# Patient Record
Sex: Male | Born: 1935 | Race: White | Hispanic: No | Smoking: Former smoker
Health system: Southern US, Community
[De-identification: ages and names within clinical notes are randomized; demographics above are authoritative.]

## PROBLEM LIST (undated history)

## (undated) DIAGNOSIS — I10 Essential (primary) hypertension: Secondary | ICD-10-CM

## (undated) DIAGNOSIS — I2510 Atherosclerotic heart disease of native coronary artery without angina pectoris: Secondary | ICD-10-CM

## (undated) DIAGNOSIS — E785 Hyperlipidemia, unspecified: Secondary | ICD-10-CM

## (undated) DIAGNOSIS — I6529 Occlusion and stenosis of unspecified carotid artery: Secondary | ICD-10-CM

## (undated) DIAGNOSIS — I639 Cerebral infarction, unspecified: Secondary | ICD-10-CM

## (undated) HISTORY — DX: Hyperlipidemia, unspecified: E78.5

## (undated) HISTORY — DX: Atherosclerotic heart disease of native coronary artery without angina pectoris: I25.10

## (undated) HISTORY — DX: Occlusion and stenosis of unspecified carotid artery: I65.29

## (undated) HISTORY — DX: Essential (primary) hypertension: I10

## (undated) HISTORY — PX: CAROTID ENDARTERECTOMY: SUR193

## (undated) HISTORY — PX: CORONARY ARTERY BYPASS GRAFT: SHX141

## (undated) HISTORY — DX: Cerebral infarction, unspecified: I63.9

## (undated) HISTORY — PX: CARPAL TUNNEL RELEASE: SHX101

## (undated) HISTORY — PX: APPENDECTOMY: SHX54

---

## 1998-01-12 ENCOUNTER — Emergency Department (HOSPITAL_COMMUNITY): Admission: EM | Admit: 1998-01-12 | Discharge: 1998-01-12 | Payer: Self-pay | Admitting: Emergency Medicine

## 1998-01-12 ENCOUNTER — Encounter: Payer: Self-pay | Admitting: Emergency Medicine

## 2001-02-18 ENCOUNTER — Emergency Department (HOSPITAL_COMMUNITY): Admission: EM | Admit: 2001-02-18 | Discharge: 2001-02-18 | Payer: Self-pay

## 2001-02-18 ENCOUNTER — Ambulatory Visit (HOSPITAL_COMMUNITY): Admission: RE | Admit: 2001-02-18 | Discharge: 2001-02-18 | Payer: Self-pay | Admitting: Family Medicine

## 2001-02-18 ENCOUNTER — Encounter: Payer: Self-pay | Admitting: Family Medicine

## 2001-03-13 ENCOUNTER — Encounter: Payer: Self-pay | Admitting: Vascular Surgery

## 2001-03-17 ENCOUNTER — Inpatient Hospital Stay (HOSPITAL_COMMUNITY): Admission: RE | Admit: 2001-03-17 | Discharge: 2001-03-18 | Payer: Self-pay | Admitting: Vascular Surgery

## 2001-03-17 ENCOUNTER — Encounter (INDEPENDENT_AMBULATORY_CARE_PROVIDER_SITE_OTHER): Payer: Self-pay | Admitting: *Deleted

## 2004-09-27 ENCOUNTER — Encounter: Payer: Self-pay | Admitting: Cardiology

## 2004-09-27 ENCOUNTER — Ambulatory Visit: Payer: Self-pay | Admitting: Cardiology

## 2004-09-27 ENCOUNTER — Ambulatory Visit (HOSPITAL_COMMUNITY): Admission: RE | Admit: 2004-09-27 | Discharge: 2004-09-27 | Payer: Self-pay | Admitting: Family Medicine

## 2006-04-28 ENCOUNTER — Inpatient Hospital Stay (HOSPITAL_COMMUNITY): Admission: EM | Admit: 2006-04-28 | Discharge: 2006-04-30 | Payer: Self-pay | Admitting: Emergency Medicine

## 2006-04-29 ENCOUNTER — Ambulatory Visit: Payer: Self-pay | Admitting: Vascular Surgery

## 2006-04-29 ENCOUNTER — Encounter (INDEPENDENT_AMBULATORY_CARE_PROVIDER_SITE_OTHER): Payer: Self-pay | Admitting: Cardiology

## 2006-04-29 ENCOUNTER — Encounter: Payer: Self-pay | Admitting: Vascular Surgery

## 2008-05-17 ENCOUNTER — Encounter: Admission: RE | Admit: 2008-05-17 | Discharge: 2008-05-17 | Payer: Self-pay | Admitting: Cardiology

## 2008-06-02 DIAGNOSIS — E785 Hyperlipidemia, unspecified: Secondary | ICD-10-CM

## 2008-06-02 HISTORY — DX: Hyperlipidemia, unspecified: E78.5

## 2008-06-10 ENCOUNTER — Encounter: Payer: Self-pay | Admitting: Cardiothoracic Surgery

## 2008-06-10 ENCOUNTER — Ambulatory Visit: Payer: Self-pay | Admitting: Cardiothoracic Surgery

## 2008-06-10 ENCOUNTER — Inpatient Hospital Stay (HOSPITAL_COMMUNITY): Admission: AD | Admit: 2008-06-10 | Discharge: 2008-06-22 | Payer: Self-pay | Admitting: Cardiovascular Disease

## 2008-06-10 HISTORY — PX: CARDIAC CATHETERIZATION: SHX172

## 2008-06-11 ENCOUNTER — Encounter: Payer: Self-pay | Admitting: Cardiothoracic Surgery

## 2008-06-14 ENCOUNTER — Encounter: Payer: Self-pay | Admitting: Cardiothoracic Surgery

## 2008-06-18 ENCOUNTER — Ambulatory Visit: Payer: Self-pay | Admitting: Physical Medicine & Rehabilitation

## 2008-07-02 ENCOUNTER — Encounter: Admission: RE | Admit: 2008-07-02 | Discharge: 2008-07-02 | Payer: Self-pay | Admitting: Cardiothoracic Surgery

## 2008-07-02 ENCOUNTER — Ambulatory Visit: Payer: Self-pay | Admitting: Cardiothoracic Surgery

## 2008-07-16 ENCOUNTER — Ambulatory Visit: Payer: Self-pay | Admitting: Cardiothoracic Surgery

## 2008-07-16 ENCOUNTER — Encounter: Admission: RE | Admit: 2008-07-16 | Discharge: 2008-07-16 | Payer: Self-pay | Admitting: Cardiothoracic Surgery

## 2008-07-22 ENCOUNTER — Encounter (HOSPITAL_COMMUNITY): Admission: RE | Admit: 2008-07-22 | Discharge: 2008-10-20 | Payer: Self-pay | Admitting: Cardiology

## 2008-09-20 ENCOUNTER — Inpatient Hospital Stay (HOSPITAL_COMMUNITY): Admission: EM | Admit: 2008-09-20 | Discharge: 2008-09-22 | Payer: Self-pay | Admitting: Emergency Medicine

## 2008-10-21 ENCOUNTER — Encounter (HOSPITAL_COMMUNITY): Admission: RE | Admit: 2008-10-21 | Discharge: 2009-01-19 | Payer: Self-pay | Admitting: Cardiology

## 2008-12-29 ENCOUNTER — Ambulatory Visit (HOSPITAL_COMMUNITY): Admission: RE | Admit: 2008-12-29 | Discharge: 2008-12-29 | Payer: Self-pay | Admitting: Family Medicine

## 2009-07-01 DIAGNOSIS — A4902 Methicillin resistant Staphylococcus aureus infection, unspecified site: Secondary | ICD-10-CM | POA: Insufficient documentation

## 2009-07-01 DIAGNOSIS — I6529 Occlusion and stenosis of unspecified carotid artery: Secondary | ICD-10-CM | POA: Insufficient documentation

## 2009-07-02 ENCOUNTER — Inpatient Hospital Stay (HOSPITAL_COMMUNITY): Admission: EM | Admit: 2009-07-02 | Discharge: 2009-07-09 | Payer: Self-pay | Admitting: Emergency Medicine

## 2009-07-02 ENCOUNTER — Encounter (INDEPENDENT_AMBULATORY_CARE_PROVIDER_SITE_OTHER): Payer: Self-pay | Admitting: Emergency Medicine

## 2009-07-04 ENCOUNTER — Encounter (INDEPENDENT_AMBULATORY_CARE_PROVIDER_SITE_OTHER): Payer: Self-pay | Admitting: Neurology

## 2009-07-05 ENCOUNTER — Encounter (INDEPENDENT_AMBULATORY_CARE_PROVIDER_SITE_OTHER): Payer: Self-pay | Admitting: Neurology

## 2009-07-05 ENCOUNTER — Encounter (INDEPENDENT_AMBULATORY_CARE_PROVIDER_SITE_OTHER): Payer: Self-pay | Admitting: Emergency Medicine

## 2009-07-08 ENCOUNTER — Ambulatory Visit: Payer: Self-pay | Admitting: Infectious Diseases

## 2009-07-21 ENCOUNTER — Encounter (INDEPENDENT_AMBULATORY_CARE_PROVIDER_SITE_OTHER): Payer: Self-pay | Admitting: *Deleted

## 2009-07-21 DIAGNOSIS — I10 Essential (primary) hypertension: Secondary | ICD-10-CM

## 2009-07-21 DIAGNOSIS — J189 Pneumonia, unspecified organism: Secondary | ICD-10-CM | POA: Insufficient documentation

## 2009-07-21 DIAGNOSIS — Z8679 Personal history of other diseases of the circulatory system: Secondary | ICD-10-CM

## 2009-07-21 DIAGNOSIS — Z9889 Other specified postprocedural states: Secondary | ICD-10-CM

## 2009-07-21 DIAGNOSIS — Z9849 Cataract extraction status, unspecified eye: Secondary | ICD-10-CM

## 2009-07-25 ENCOUNTER — Encounter: Payer: Self-pay | Admitting: Infectious Diseases

## 2009-07-28 ENCOUNTER — Encounter: Payer: Self-pay | Admitting: Infectious Diseases

## 2009-08-04 ENCOUNTER — Ambulatory Visit: Payer: Self-pay | Admitting: Infectious Diseases

## 2009-08-25 ENCOUNTER — Inpatient Hospital Stay (HOSPITAL_COMMUNITY): Admission: EM | Admit: 2009-08-25 | Discharge: 2009-08-29 | Payer: Self-pay | Admitting: Emergency Medicine

## 2009-08-26 IMAGING — CR DG CHEST 2V
2 series · 2 of 2 positions shown · non-contrast
Comparison: 07/02/2008

CLINICAL DATA: Status post CABG 5 weeks ago

CHEST - 2 VIEW

[w chest pa]
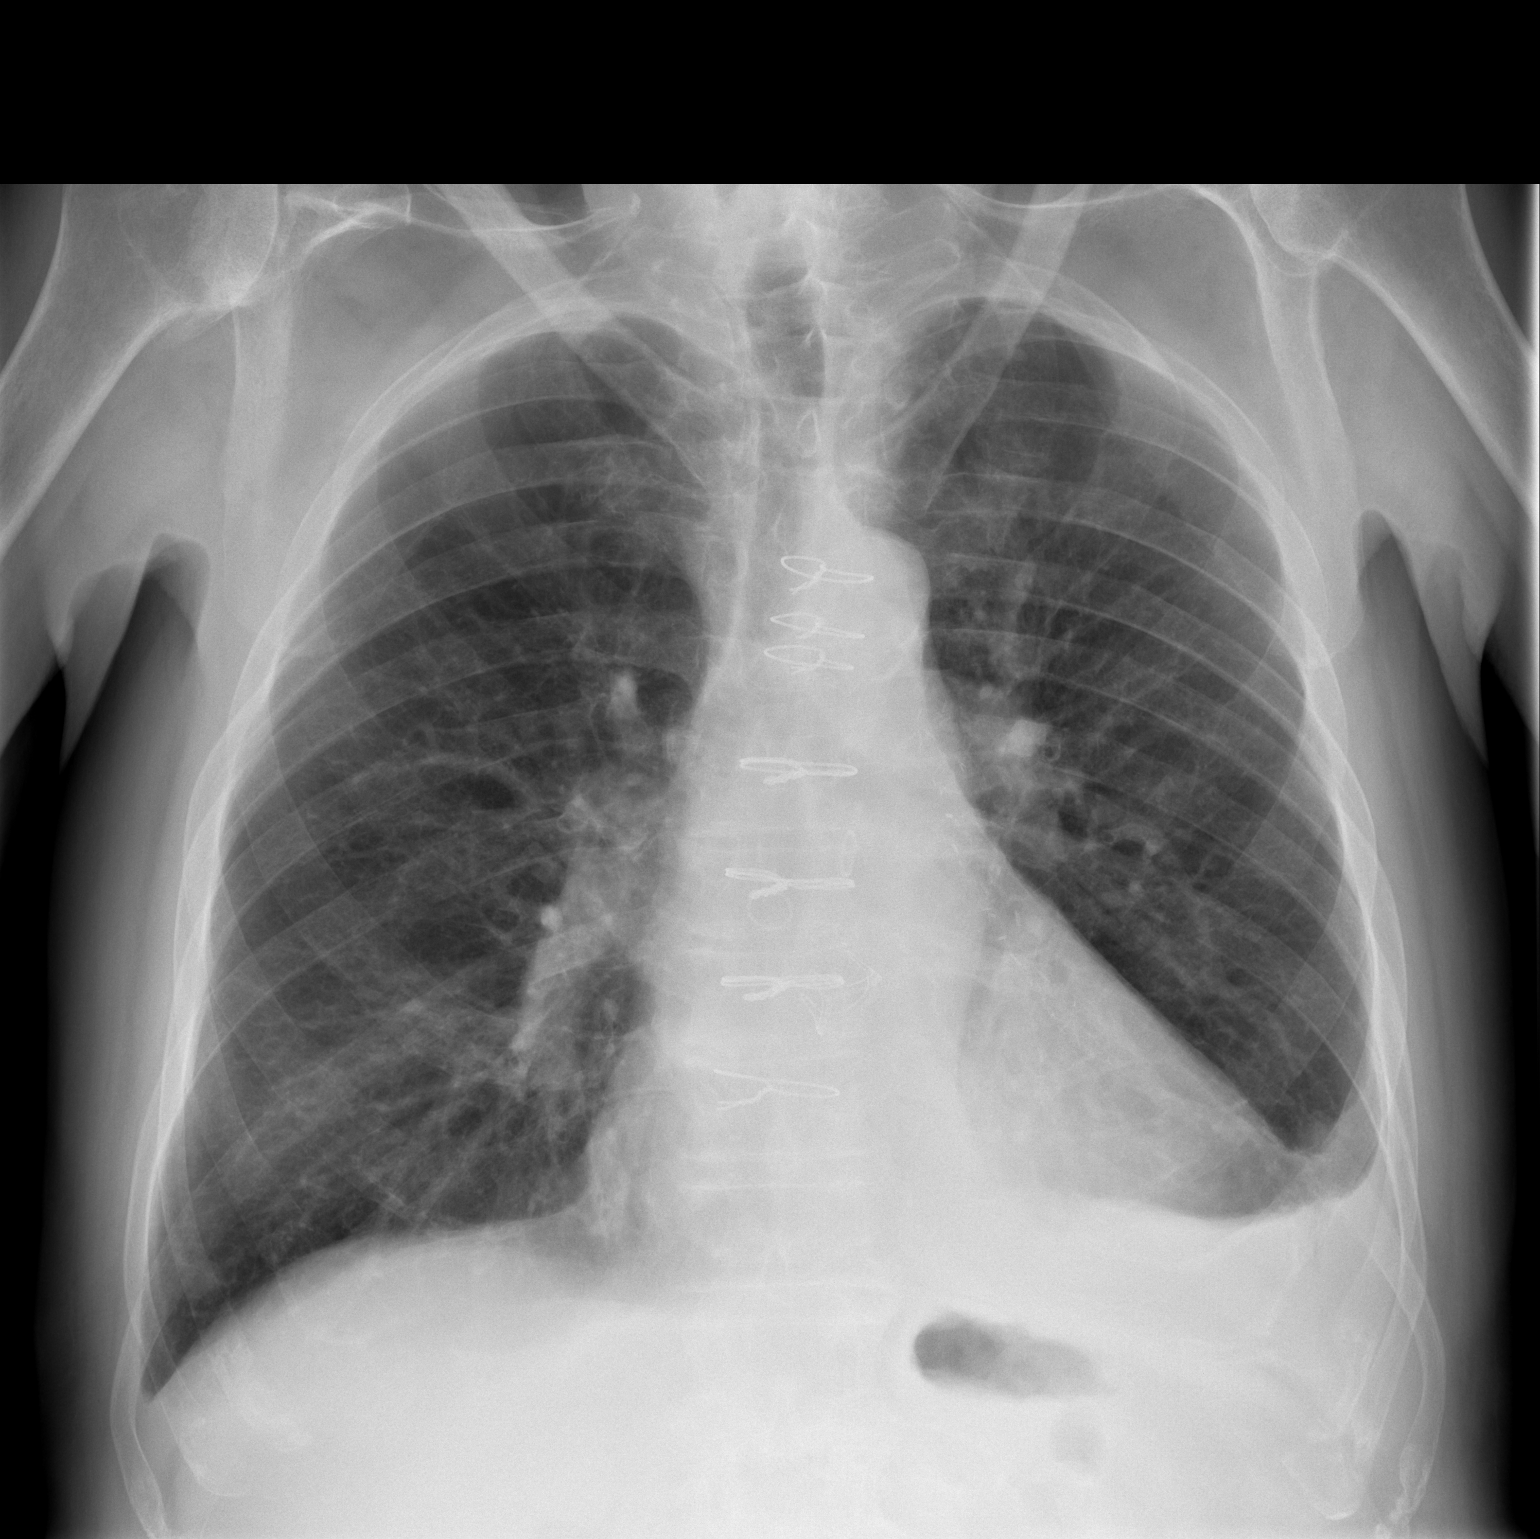

[w chest lat]
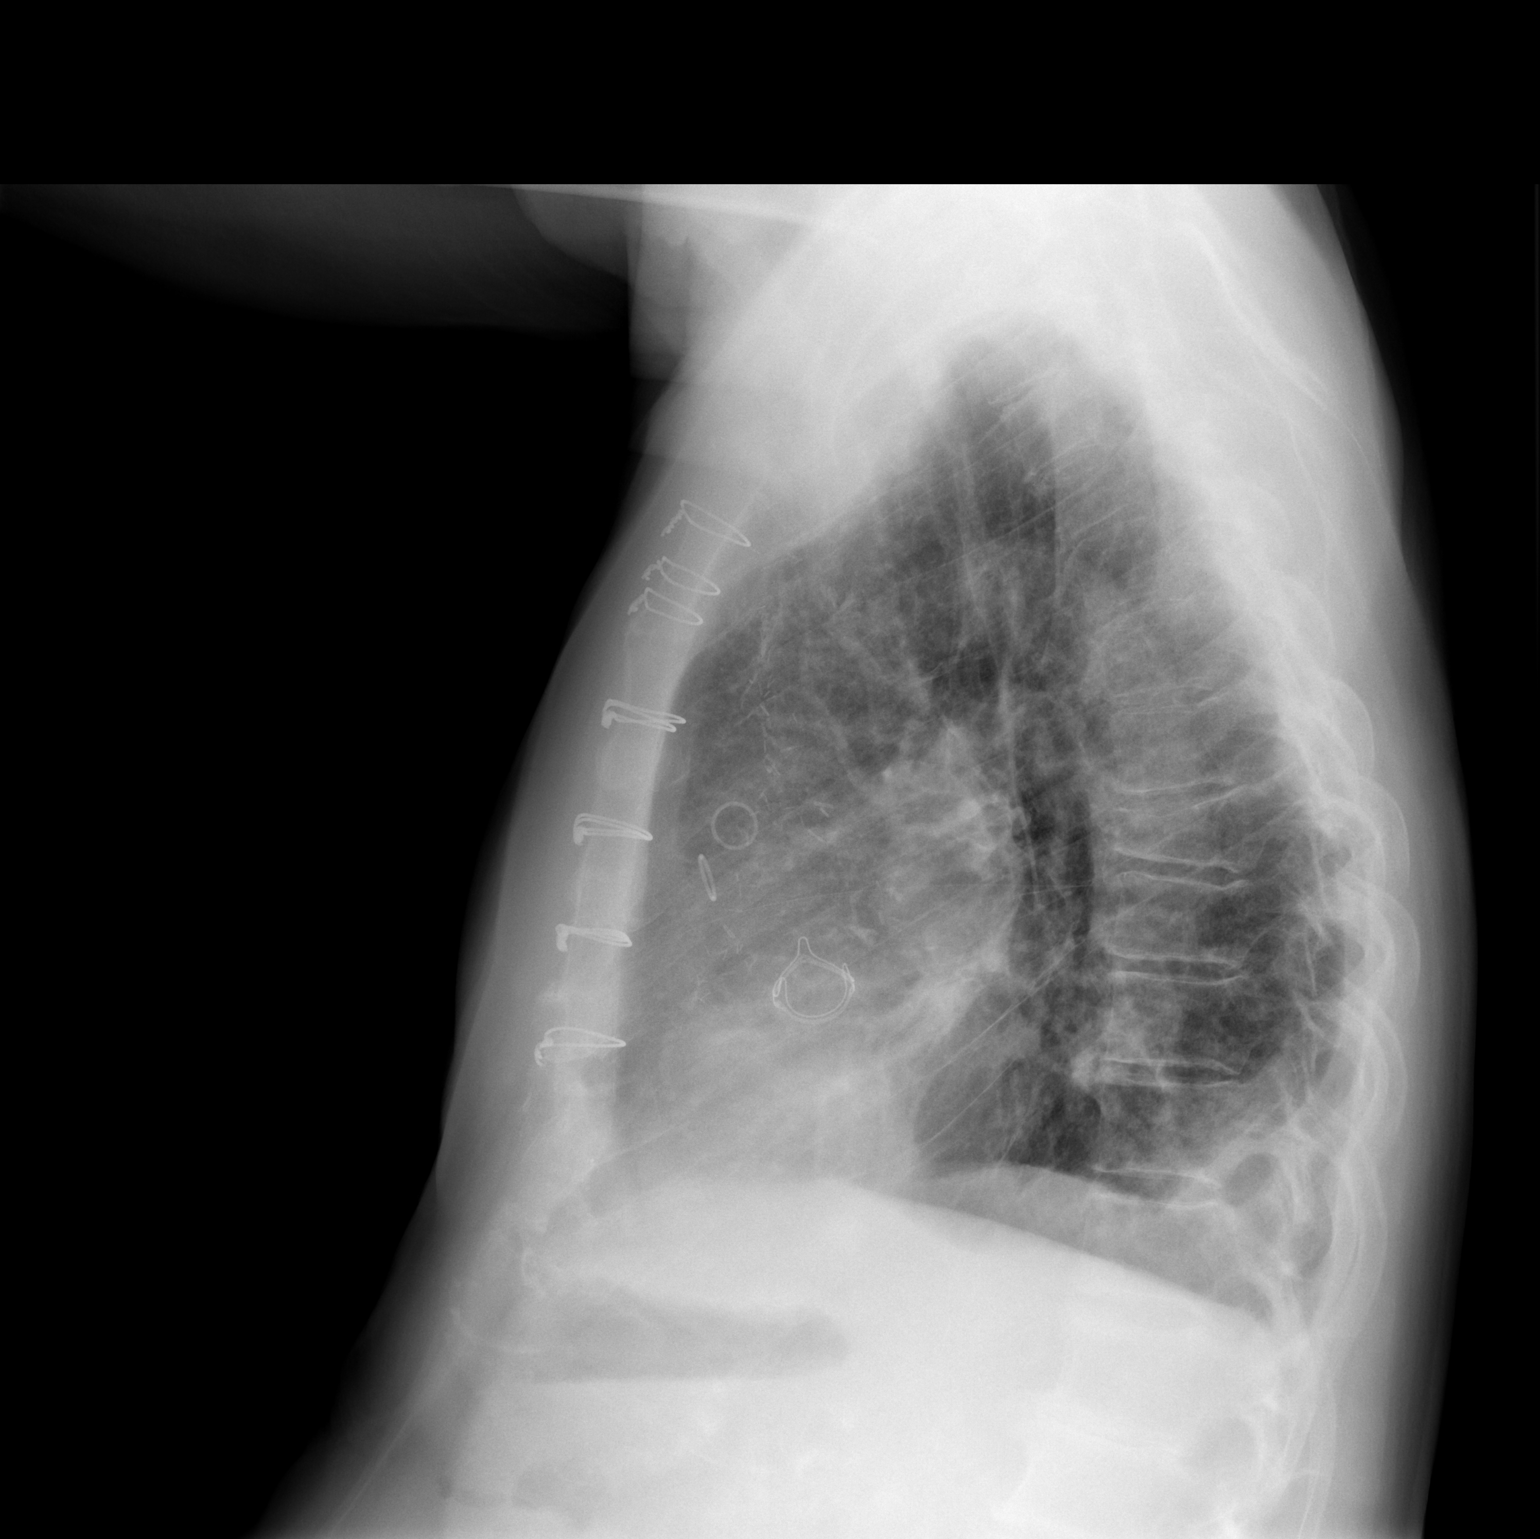

[2 of 2 positions shown; findings below may reference images not displayed]

FINDINGS: Status post CABG again noted. Status post cardiac valve
replacement.  Cardiomediastinal silhouette is stable.  Stable small
left pleural effusion and left basilar atelectasis.  Right lung is
clear. No pulmonary edema.
IMPRESSION: Status post CABG and cardiac valve replacement.  No pulmonary
edema.  Small left pleural effusion and left basilar atelectasis.

## 2009-08-28 ENCOUNTER — Encounter (INDEPENDENT_AMBULATORY_CARE_PROVIDER_SITE_OTHER): Payer: Self-pay | Admitting: Internal Medicine

## 2009-08-28 DIAGNOSIS — I10 Essential (primary) hypertension: Secondary | ICD-10-CM

## 2009-08-28 HISTORY — DX: Essential (primary) hypertension: I10

## 2009-08-29 ENCOUNTER — Encounter (INDEPENDENT_AMBULATORY_CARE_PROVIDER_SITE_OTHER): Payer: Self-pay | Admitting: Cardiology

## 2009-09-23 ENCOUNTER — Ambulatory Visit (HOSPITAL_COMMUNITY): Admission: RE | Admit: 2009-09-23 | Discharge: 2009-09-23 | Payer: Self-pay | Admitting: Family Medicine

## 2009-10-13 ENCOUNTER — Encounter: Admission: RE | Admit: 2009-10-13 | Discharge: 2009-11-14 | Payer: Self-pay | Admitting: Neurology

## 2010-03-12 ENCOUNTER — Encounter: Payer: Self-pay | Admitting: Family Medicine

## 2010-03-21 NOTE — Miscellaneous (Signed)
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 (ICD-433.10) Added new problem of HEART VALVE REPLACEMENT, HX OF (ICD-V15.1) - 2010 Added new problem of MRSA (ICD-041.12) - blood culture + Added new problem of TRANSIENT ISCHEMIC ATTACKS, HX OF (ICD-V12.50) - 2003 Added new problem of CATARACT EXTRACTION, HX OF (ICD-V45.61) - 2000 Added new problem of PNEUMONIA (ZOX-096) - August 2010 Added new problem of HYPERTENSION (ICD-401.9) Medications: Added new medication of ASPIRIN 325 MG TABS (ASPIRIN) Take 1 tablet by mouth once a day Added new medication of FLOMAX 0.4 MG CAPS (TAMSULOSIN HCL) Take 1 tablet by mouth once a day Added new medication of COREG 12.5 MG TABS (CARVEDILOL) Take 1 tablet by mouth two times a day Added new medication of FUROSEMIDE 40 MG TABS (FUROSEMIDE) Take 1 tablet by mouth once a day Added new medication of GLIPIZIDE 5 MG TABS (GLIPIZIDE) Take 1 tablet by mouth once a day Added new medication of KLOR-CON M20 20 MEQ CR-TABS (POTASSIUM CHLORIDE CRYS CR) Take 1 tablet by mouth once a day Added new medication of LIPITOR 80 MG TABS (ATORVASTATIN CALCIUM) Take 1/2 tablet by mouth at bedtime Added new medication of NU-IRON 150 MG CAPS (POLYSACCHARIDE IRON COMPLEX) Take 1 tablet by mouth once a day Added new medication of VITAMIN B-6 100 MG TABS (PYRIDOXINE HCL) Take 1 tablet by mouth once a day Added new medication of VITAMIN D3 1000 UNIT TABS (CHOLECALCIFEROL) Take 1 tablet by mouth once a day Added new medication of VANCOMYCIN HCL 750 MG SOLR (VANCOMYCIN HCL) Administer 750 mg IV via PICC every 12 hours, pharmacy to monitor and adjust dose per protocol Observations: Added new observation of NKA: T (07/21/2009 16:31)

## 2010-03-21 NOTE — Assessment & Plan Note (Signed)
Summary: hsfu / cellulitis   Vital Signs:  Patient profile:   77 year old male Height:      68 inches (172.72 cm) Weight:      152.5 pounds (69.32 kg) BMI:     23.27 Temp:     98.1 degrees F (36.72 degrees C) oral BP sitting:   155 / 84  (left arm) Cuff size:   regular  Vitals Entered By: Jennet Maduro RN (August 04, 2009 3:44 PM) CC: HSFU, d/c 07/09/2009, Stroke, MRSA septicemia, PICC site unremarkable Is Patient Diabetic? Yes Did you bring your meter with you today? 138 Pain Assessment Patient in pain? no      Nutritional Status BMI of 19 -24 = normal Nutritional Status Detail appetite "good"  Have you ever been in a relationship where you felt threatened, hurt or afraid?not asked today   Does patient need assistance? Functional Status Cook/clean, Shopping, Social activities Ambulation Normal   CC:  HSFU, d/c 07/09/2009, Stroke, MRSA septicemia, and PICC site unremarkable.  Preventive Screening-Counseling & Management  Alcohol-Tobacco     Alcohol drinks/day: 0     Smoking Status: quit     Year Quit: 1946  Caffeine-Diet-Exercise     Caffeine use/day: yes     Does Patient Exercise: yes     Type of exercise: around the yeard to the mailbox  Safety-Violence-Falls     Seat Belt Use: yes  Allergies: No Known Drug Allergies   Other Orders: Est. Patient Level IV (16109)

## 2010-03-21 NOTE — Miscellaneous (Signed)
Summary: [facility]: CBCw/Diff  [facility]: CBCw/Diff   Imported By: Florinda Marker 08/04/2009 16:05:41  _____________________________________________________________________  External Attachment:    Type:   Image     Comment:   External Document

## 2010-03-21 NOTE — Miscellaneous (Signed)
Summary: HIPAA Restrictions  HIPAA Restrictions   Imported By: Florinda Marker 08/08/2009 14:45:24  _____________________________________________________________________  External Attachment:    Type:   Image     Comment:   External Document

## 2010-03-21 NOTE — Miscellaneous (Signed)
Summary: [facility]: Orders  [facility]: Orders   Imported By: Florinda Marker 08/03/2009 11:20:04  _____________________________________________________________________  External Attachment:    Type:   Image     Comment:   External Document

## 2010-05-07 LAB — HEMOGLOBIN A1C
Hgb A1c MFr Bld: 6.2 % — ABNORMAL HIGH (ref <5.7)
Mean Plasma Glucose: 131 mg/dL — ABNORMAL HIGH (ref <117)

## 2010-05-07 LAB — URINE CULTURE: Colony Count: NO GROWTH

## 2010-05-07 LAB — GLUCOSE, CAPILLARY
Glucose-Capillary: 102 mg/dL — ABNORMAL HIGH (ref 70–99)
Glucose-Capillary: 117 mg/dL — ABNORMAL HIGH (ref 70–99)
Glucose-Capillary: 121 mg/dL — ABNORMAL HIGH (ref 70–99)
Glucose-Capillary: 122 mg/dL — ABNORMAL HIGH (ref 70–99)
Glucose-Capillary: 145 mg/dL — ABNORMAL HIGH (ref 70–99)
Glucose-Capillary: 147 mg/dL — ABNORMAL HIGH (ref 70–99)
Glucose-Capillary: 180 mg/dL — ABNORMAL HIGH (ref 70–99)
Glucose-Capillary: 99 mg/dL (ref 70–99)

## 2010-05-07 LAB — BASIC METABOLIC PANEL
CO2: 27 mEq/L (ref 19–32)
CO2: 28 mEq/L (ref 19–32)
CO2: 29 mEq/L (ref 19–32)
CO2: 30 mEq/L (ref 19–32)
Calcium: 8.7 mg/dL (ref 8.4–10.5)
Calcium: 9.2 mg/dL (ref 8.4–10.5)
Chloride: 103 mEq/L (ref 96–112)
Chloride: 105 mEq/L (ref 96–112)
Chloride: 107 mEq/L (ref 96–112)
Creatinine, Ser: 1.33 mg/dL (ref 0.4–1.5)
Creatinine, Ser: 1.48 mg/dL (ref 0.4–1.5)
Creatinine, Ser: 1.5 mg/dL (ref 0.4–1.5)
GFR calc Af Amer: 54 mL/min — ABNORMAL LOW (ref >60)
GFR calc Af Amer: 55 mL/min — ABNORMAL LOW (ref >60)
GFR calc Af Amer: >60 mL/min (ref >60)
GFR calc non Af Amer: 51 mL/min — ABNORMAL LOW (ref >60)
Glucose, Bld: 125 mg/dL — ABNORMAL HIGH (ref 70–99)
Glucose, Bld: 156 mg/dL — ABNORMAL HIGH (ref 70–99)
Glucose, Bld: 167 mg/dL — ABNORMAL HIGH (ref 70–99)
Potassium: 3.8 mEq/L (ref 3.5–5.1)
Sodium: 139 mEq/L (ref 135–145)
Sodium: 141 mEq/L (ref 135–145)
Sodium: 143 mEq/L (ref 135–145)

## 2010-05-07 LAB — LIPID PANEL
HDL: 40 mg/dL (ref >39)
LDL Cholesterol: 44 mg/dL (ref 0–99)
Total CHOL/HDL Ratio: 2.7 RATIO
Triglycerides: 111 mg/dL (ref <150)
VLDL: 22 mg/dL (ref 0–40)

## 2010-05-07 LAB — HEPATIC FUNCTION PANEL
Alkaline Phosphatase: 39 U/L (ref 39–117)
Indirect Bilirubin: 0.9 mg/dL (ref 0.3–0.9)
Total Bilirubin: 1.1 mg/dL (ref 0.3–1.2)

## 2010-05-07 LAB — CULTURE, BLOOD (ROUTINE X 2)
Culture: NO GROWTH
Culture: NO GROWTH
Culture: NO GROWTH

## 2010-05-07 LAB — CBC
Hemoglobin: 13 g/dL (ref 13.0–17.0)
MCH: 32.6 pg (ref 26.0–34.0)
MCV: 95.4 fL (ref 78.0–100.0)
Platelets: 112 10*3/uL — ABNORMAL LOW (ref 150–400)
RBC: 4 MIL/uL — ABNORMAL LOW (ref 4.22–5.81)

## 2010-05-07 LAB — DIFFERENTIAL
Eosinophils Absolute: 0.1 10*3/uL (ref 0.0–0.7)
Eosinophils Relative: 1 % (ref 0–5)
Lymphs Abs: 0.6 10*3/uL — ABNORMAL LOW (ref 0.7–4.0)
Monocytes Relative: 5 % (ref 3–12)

## 2010-05-07 LAB — POCT CARDIAC MARKERS

## 2010-05-07 LAB — URINALYSIS, ROUTINE W REFLEX MICROSCOPIC
Ketones, ur: NEGATIVE mg/dL
Nitrite: NEGATIVE
Specific Gravity, Urine: 1.02 (ref 1.005–1.030)
pH: 5.5 (ref 5.0–8.0)

## 2010-05-07 LAB — MRSA PCR SCREENING: MRSA by PCR: POSITIVE — AB

## 2010-05-07 LAB — TROPONIN I: Troponin I: 0.01 ng/mL (ref 0.00–0.06)

## 2010-05-08 LAB — BLOOD GAS, ARTERIAL
TCO2: 28.5 mmol/L (ref 0–100)
pCO2 arterial: 48.1 mmHg — ABNORMAL HIGH (ref 35.0–45.0)
pH, Arterial: 7.385 (ref 7.350–7.450)
pO2, Arterial: 88.3 mmHg (ref 80.0–100.0)

## 2010-05-08 LAB — CBC
HCT: 39.1 % (ref 39.0–52.0)
Hemoglobin: 12.9 g/dL — ABNORMAL LOW (ref 13.0–17.0)
Hemoglobin: 13.4 g/dL (ref 13.0–17.0)
MCHC: 34.4 g/dL (ref 30.0–36.0)
MCHC: 34.4 g/dL (ref 30.0–36.0)
MCV: 95.9 fL (ref 78.0–100.0)
RBC: 3.92 MIL/uL — ABNORMAL LOW (ref 4.22–5.81)
RDW: 12.9 % (ref 11.5–15.5)

## 2010-05-08 LAB — BASIC METABOLIC PANEL
CO2: 31 mEq/L (ref 19–32)
Calcium: 8.9 mg/dL (ref 8.4–10.5)
Chloride: 106 mEq/L (ref 96–112)
GFR calc Af Amer: >60 mL/min (ref >60)
Potassium: 4 mEq/L (ref 3.5–5.1)
Sodium: 141 mEq/L (ref 135–145)

## 2010-05-08 LAB — URINALYSIS, ROUTINE W REFLEX MICROSCOPIC
Glucose, UA: NEGATIVE mg/dL
Hgb urine dipstick: NEGATIVE
Protein, ur: NEGATIVE mg/dL

## 2010-05-08 LAB — URINE CULTURE

## 2010-05-08 LAB — DIFFERENTIAL
Basophils Absolute: 0 10*3/uL (ref 0.0–0.1)
Basophils Relative: 0 % (ref 0–1)
Eosinophils Absolute: 0.2 10*3/uL (ref 0.0–0.7)
Eosinophils Relative: 2 % (ref 0–5)
Monocytes Absolute: 0.8 10*3/uL (ref 0.1–1.0)

## 2010-05-08 LAB — CULTURE, BLOOD (ROUTINE X 2)

## 2010-05-08 LAB — GLUCOSE, CAPILLARY

## 2010-05-09 LAB — CBC
HCT: 36.6 % — ABNORMAL LOW (ref 39.0–52.0)
Hemoglobin: 12.7 g/dL — ABNORMAL LOW (ref 13.0–17.0)
Hemoglobin: 13.3 g/dL (ref 13.0–17.0)
Platelets: 174 10*3/uL (ref 150–400)
RBC: 3.84 MIL/uL — ABNORMAL LOW (ref 4.22–5.81)
RDW: 13.3 % (ref 11.5–15.5)
RDW: 13.3 % (ref 11.5–15.5)

## 2010-05-09 LAB — COMPREHENSIVE METABOLIC PANEL
ALT: 16 U/L (ref 0–53)
Albumin: 3.5 g/dL (ref 3.5–5.2)
Alkaline Phosphatase: 50 U/L (ref 39–117)
BUN: 20 mg/dL (ref 6–23)
BUN: 23 mg/dL (ref 6–23)
CO2: 30 mEq/L (ref 19–32)
Calcium: 9.3 mg/dL (ref 8.4–10.5)
Creatinine, Ser: 1.56 mg/dL — ABNORMAL HIGH (ref 0.4–1.5)
GFR calc non Af Amer: 52 mL/min — ABNORMAL LOW (ref >60)
Glucose, Bld: 130 mg/dL — ABNORMAL HIGH (ref 70–99)
Potassium: 3.8 mEq/L (ref 3.5–5.1)
Potassium: 4.3 mEq/L (ref 3.5–5.1)
Total Bilirubin: 0.7 mg/dL (ref 0.3–1.2)
Total Protein: 6.1 g/dL (ref 6.0–8.3)
Total Protein: 6.7 g/dL (ref 6.0–8.3)

## 2010-05-09 LAB — URINALYSIS, DIPSTICK ONLY
Bilirubin Urine: NEGATIVE
Glucose, UA: NEGATIVE mg/dL
Ketones, ur: NEGATIVE mg/dL
Leukocytes, UA: NEGATIVE
Nitrite: NEGATIVE
Protein, ur: 30 mg/dL — AB

## 2010-05-09 LAB — URINALYSIS, ROUTINE W REFLEX MICROSCOPIC
Hgb urine dipstick: NEGATIVE
Ketones, ur: NEGATIVE mg/dL
Protein, ur: NEGATIVE mg/dL
Urobilinogen, UA: 1 mg/dL (ref 0.0–1.0)

## 2010-05-09 LAB — GLUCOSE, CAPILLARY: Glucose-Capillary: 118 mg/dL — ABNORMAL HIGH (ref 70–99)

## 2010-05-09 LAB — LIPID PANEL
HDL: 39 mg/dL — ABNORMAL LOW (ref >39)
LDL Cholesterol: 62 mg/dL (ref 0–99)
Total CHOL/HDL Ratio: 2.8 RATIO
Triglycerides: 36 mg/dL (ref <150)
VLDL: 7 mg/dL (ref 0–40)

## 2010-05-09 LAB — DIFFERENTIAL
Lymphocytes Relative: 18 % (ref 12–46)
Monocytes Absolute: 0.8 10*3/uL (ref 0.1–1.0)
Monocytes Relative: 11 % (ref 3–12)
Neutro Abs: 4.7 10*3/uL (ref 1.7–7.7)

## 2010-05-09 LAB — CARDIAC PANEL(CRET KIN+CKTOT+MB+TROPI): CK, MB: 2.6 ng/mL (ref 0.3–4.0)

## 2010-05-09 LAB — TROPONIN I: Troponin I: 0.02 ng/mL (ref 0.00–0.06)

## 2010-05-09 LAB — CK TOTAL AND CKMB (NOT AT ARMC)
CK, MB: 1.5 ng/mL (ref 0.3–4.0)
Relative Index: INVALID (ref 0.0–2.5)
Total CK: 46 U/L (ref 7–232)

## 2010-05-09 LAB — HEMOGLOBIN A1C
Hgb A1c MFr Bld: 6.2 % — ABNORMAL HIGH (ref <5.7)
Mean Plasma Glucose: 131 mg/dL — ABNORMAL HIGH (ref <117)

## 2010-05-09 LAB — APTT: aPTT: 24 seconds (ref 24–37)

## 2010-05-17 ENCOUNTER — Encounter: Payer: Self-pay | Admitting: Family Medicine

## 2010-05-17 DIAGNOSIS — E119 Type 2 diabetes mellitus without complications: Secondary | ICD-10-CM

## 2010-05-25 LAB — GLUCOSE, CAPILLARY
Glucose-Capillary: 114 mg/dL — ABNORMAL HIGH (ref 70–99)
Glucose-Capillary: 112 mg/dL — ABNORMAL HIGH (ref 70–99)

## 2010-05-26 LAB — GLUCOSE, CAPILLARY: Glucose-Capillary: 124 mg/dL — ABNORMAL HIGH (ref 70–99)

## 2010-05-27 LAB — GLUCOSE, CAPILLARY
Glucose-Capillary: 104 mg/dL — ABNORMAL HIGH (ref 70–99)
Glucose-Capillary: 118 mg/dL — ABNORMAL HIGH (ref 70–99)
Glucose-Capillary: 131 mg/dL — ABNORMAL HIGH (ref 70–99)
Glucose-Capillary: 136 mg/dL — ABNORMAL HIGH (ref 70–99)
Glucose-Capillary: 177 mg/dL — ABNORMAL HIGH (ref 70–99)
Glucose-Capillary: 193 mg/dL — ABNORMAL HIGH (ref 70–99)
Glucose-Capillary: 198 mg/dL — ABNORMAL HIGH (ref 70–99)

## 2010-05-27 LAB — BASIC METABOLIC PANEL
Calcium: 8.8 mg/dL (ref 8.4–10.5)
Creatinine, Ser: 1.34 mg/dL (ref 0.4–1.5)
GFR calc Af Amer: >60 mL/min (ref >60)
GFR calc non Af Amer: 51 mL/min — ABNORMAL LOW (ref >60)
Sodium: 140 mEq/L (ref 135–145)

## 2010-05-27 LAB — VANCOMYCIN, TROUGH: Vancomycin Tr: 18.4 ug/mL (ref 10.0–20.0)

## 2010-05-27 LAB — CBC
Hemoglobin: 11.8 g/dL — ABNORMAL LOW (ref 13.0–17.0)
MCHC: 34.2 g/dL (ref 30.0–36.0)
MCHC: 34.3 g/dL (ref 30.0–36.0)
Platelets: 181 10*3/uL (ref 150–400)
RBC: 3.75 MIL/uL — ABNORMAL LOW (ref 4.22–5.81)
RDW: 13.9 % (ref 11.5–15.5)
RDW: 14 % (ref 11.5–15.5)

## 2010-05-28 LAB — COMPREHENSIVE METABOLIC PANEL
ALT: 16 U/L (ref 0–53)
AST: 36 U/L (ref 0–37)
Albumin: 2.9 g/dL — ABNORMAL LOW (ref 3.5–5.2)
Alkaline Phosphatase: 42 U/L (ref 39–117)
Alkaline Phosphatase: 54 U/L (ref 39–117)
BUN: 18 mg/dL (ref 6–23)
CO2: 31 mEq/L (ref 19–32)
Calcium: 9.4 mg/dL (ref 8.4–10.5)
Chloride: 104 mEq/L (ref 96–112)
Creatinine, Ser: 1.22 mg/dL (ref 0.4–1.5)
GFR calc Af Amer: >60 mL/min (ref >60)
GFR calc non Af Amer: 59 mL/min — ABNORMAL LOW (ref >60)
Glucose, Bld: 123 mg/dL — ABNORMAL HIGH (ref 70–99)
Glucose, Bld: 138 mg/dL — ABNORMAL HIGH (ref 70–99)
Potassium: 3.6 mEq/L (ref 3.5–5.1)
Sodium: 144 mEq/L (ref 135–145)
Total Bilirubin: 0.5 mg/dL (ref 0.3–1.2)
Total Protein: 6.3 g/dL (ref 6.0–8.3)

## 2010-05-28 LAB — DIFFERENTIAL
Basophils Absolute: 0 10*3/uL (ref 0.0–0.1)
Basophils Relative: 0 % (ref 0–1)
Eosinophils Absolute: 0.1 10*3/uL (ref 0.0–0.7)
Eosinophils Relative: 2 % (ref 0–5)
Neutrophils Relative %: 74 % (ref 43–77)

## 2010-05-28 LAB — GLUCOSE, CAPILLARY
Glucose-Capillary: 119 mg/dL — ABNORMAL HIGH (ref 70–99)
Glucose-Capillary: 146 mg/dL — ABNORMAL HIGH (ref 70–99)
Glucose-Capillary: 160 mg/dL — ABNORMAL HIGH (ref 70–99)

## 2010-05-28 LAB — CBC
Hemoglobin: 14 g/dL (ref 13.0–17.0)
MCHC: 33.7 g/dL (ref 30.0–36.0)
RBC: 4.46 MIL/uL (ref 4.22–5.81)
WBC: 5.1 10*3/uL (ref 4.0–10.5)

## 2010-05-28 LAB — URINALYSIS, ROUTINE W REFLEX MICROSCOPIC
Bilirubin Urine: NEGATIVE
Glucose, UA: NEGATIVE mg/dL
Ketones, ur: NEGATIVE mg/dL
Nitrite: NEGATIVE
Specific Gravity, Urine: 1.009 (ref 1.005–1.030)
pH: 5.5 (ref 5.0–8.0)

## 2010-05-28 LAB — CULTURE, BLOOD (ROUTINE X 2)
Culture: NO GROWTH
Culture: NO GROWTH

## 2010-05-28 LAB — D-DIMER, QUANTITATIVE: D-Dimer, Quant: 1.32 ug/mL-FEU — ABNORMAL HIGH (ref 0.00–0.48)

## 2010-05-28 LAB — HEMOGLOBIN A1C
Hgb A1c MFr Bld: 5.9 % (ref 4.6–6.1)
Mean Plasma Glucose: 123 mg/dL

## 2010-05-29 LAB — GLUCOSE, CAPILLARY
Glucose-Capillary: 128 mg/dL — ABNORMAL HIGH (ref 70–99)
Glucose-Capillary: 101 mg/dL — ABNORMAL HIGH (ref 70–99)
Glucose-Capillary: 195 mg/dL — ABNORMAL HIGH (ref 70–99)

## 2010-05-30 LAB — CBC
HCT: 29.2 % — ABNORMAL LOW (ref 39.0–52.0)
MCHC: 34.6 g/dL (ref 30.0–36.0)
MCV: 93.6 fL (ref 78.0–100.0)
Platelets: 203 10*3/uL (ref 150–400)
RDW: 13.8 % (ref 11.5–15.5)

## 2010-05-30 LAB — BASIC METABOLIC PANEL
BUN: 26 mg/dL — ABNORMAL HIGH (ref 6–23)
BUN: 33 mg/dL — ABNORMAL HIGH (ref 6–23)
BUN: 40 mg/dL — ABNORMAL HIGH (ref 6–23)
CO2: 28 mEq/L (ref 19–32)
CO2: 28 mEq/L (ref 19–32)
Calcium: 8.8 mg/dL (ref 8.4–10.5)
Chloride: 107 mEq/L (ref 96–112)
Chloride: 109 mEq/L (ref 96–112)
Creatinine, Ser: 1.34 mg/dL (ref 0.4–1.5)
Creatinine, Ser: 1.5 mg/dL (ref 0.4–1.5)
GFR calc Af Amer: >60 mL/min (ref >60)
GFR calc non Af Amer: 48 mL/min — ABNORMAL LOW (ref >60)
Glucose, Bld: 124 mg/dL — ABNORMAL HIGH (ref 70–99)
Potassium: 4 mEq/L (ref 3.5–5.1)
Potassium: 4.5 mEq/L (ref 3.5–5.1)
Sodium: 141 mEq/L (ref 135–145)

## 2010-05-30 LAB — GLUCOSE, CAPILLARY
Glucose-Capillary: 102 mg/dL — ABNORMAL HIGH (ref 70–99)
Glucose-Capillary: 107 mg/dL — ABNORMAL HIGH (ref 70–99)
Glucose-Capillary: 123 mg/dL — ABNORMAL HIGH (ref 70–99)
Glucose-Capillary: 160 mg/dL — ABNORMAL HIGH (ref 70–99)
Glucose-Capillary: 164 mg/dL — ABNORMAL HIGH (ref 70–99)
Glucose-Capillary: 173 mg/dL — ABNORMAL HIGH (ref 70–99)
Glucose-Capillary: 87 mg/dL (ref 70–99)

## 2010-05-31 LAB — CBC
HCT: 23.8 % — ABNORMAL LOW (ref 39.0–52.0)
HCT: 25.6 % — ABNORMAL LOW (ref 39.0–52.0)
HCT: 25.8 % — ABNORMAL LOW (ref 39.0–52.0)
HCT: 27 % — ABNORMAL LOW (ref 39.0–52.0)
HCT: 28 % — ABNORMAL LOW (ref 39.0–52.0)
HCT: 29.9 % — ABNORMAL LOW (ref 39.0–52.0)
HCT: 30.7 % — ABNORMAL LOW (ref 39.0–52.0)
HCT: 37.6 % — ABNORMAL LOW (ref 39.0–52.0)
HCT: 38 % — ABNORMAL LOW (ref 39.0–52.0)
HCT: 40 % (ref 39.0–52.0)
Hemoglobin: 10.3 g/dL — ABNORMAL LOW (ref 13.0–17.0)
Hemoglobin: 10.4 g/dL — ABNORMAL LOW (ref 13.0–17.0)
Hemoglobin: 12.9 g/dL — ABNORMAL LOW (ref 13.0–17.0)
Hemoglobin: 13 g/dL (ref 13.0–17.0)
Hemoglobin: 13.5 g/dL (ref 13.0–17.0)
Hemoglobin: 8.3 g/dL — ABNORMAL LOW (ref 13.0–17.0)
Hemoglobin: 8.9 g/dL — ABNORMAL LOW (ref 13.0–17.0)
Hemoglobin: 9.1 g/dL — ABNORMAL LOW (ref 13.0–17.0)
Hemoglobin: 9.2 g/dL — ABNORMAL LOW (ref 13.0–17.0)
Hemoglobin: 9.8 g/dL — ABNORMAL LOW (ref 13.0–17.0)
MCHC: 34 g/dL (ref 30.0–36.0)
MCHC: 34.2 g/dL (ref 30.0–36.0)
MCHC: 34.2 g/dL (ref 30.0–36.0)
MCHC: 34.2 g/dL (ref 30.0–36.0)
MCHC: 34.3 g/dL (ref 30.0–36.0)
MCHC: 34.3 g/dL (ref 30.0–36.0)
MCHC: 34.4 g/dL (ref 30.0–36.0)
MCHC: 34.5 g/dL (ref 30.0–36.0)
MCHC: 34.6 g/dL (ref 30.0–36.0)
MCHC: 34.7 g/dL (ref 30.0–36.0)
MCHC: 34.8 g/dL (ref 30.0–36.0)
MCHC: 35.4 g/dL (ref 30.0–36.0)
MCV: 92.9 fL (ref 78.0–100.0)
MCV: 94.1 fL (ref 78.0–100.0)
MCV: 94.3 fL (ref 78.0–100.0)
MCV: 95.1 fL (ref 78.0–100.0)
MCV: 95.3 fL (ref 78.0–100.0)
MCV: 95.4 fL (ref 78.0–100.0)
MCV: 95.6 fL (ref 78.0–100.0)
MCV: 95.6 fL (ref 78.0–100.0)
MCV: 95.7 fL (ref 78.0–100.0)
MCV: 95.8 fL (ref 78.0–100.0)
Platelets: 106 10*3/uL — ABNORMAL LOW (ref 150–400)
Platelets: 109 10*3/uL — ABNORMAL LOW (ref 150–400)
Platelets: 111 10*3/uL — ABNORMAL LOW (ref 150–400)
Platelets: 115 10*3/uL — ABNORMAL LOW (ref 150–400)
Platelets: 158 10*3/uL (ref 150–400)
Platelets: 163 10*3/uL (ref 150–400)
Platelets: 176 10*3/uL (ref 150–400)
Platelets: 184 10*3/uL (ref 150–400)
Platelets: 78 10*3/uL — ABNORMAL LOW (ref 150–400)
Platelets: 79 10*3/uL — ABNORMAL LOW (ref 150–400)
Platelets: 82 10*3/uL — ABNORMAL LOW (ref 150–400)
RBC: 2.5 MIL/uL — ABNORMAL LOW (ref 4.22–5.81)
RBC: 2.71 MIL/uL — ABNORMAL LOW (ref 4.22–5.81)
RBC: 2.76 MIL/uL — ABNORMAL LOW (ref 4.22–5.81)
RBC: 2.82 MIL/uL — ABNORMAL LOW (ref 4.22–5.81)
RBC: 2.97 MIL/uL — ABNORMAL LOW (ref 4.22–5.81)
RBC: 3.18 MIL/uL — ABNORMAL LOW (ref 4.22–5.81)
RBC: 3.21 MIL/uL — ABNORMAL LOW (ref 4.22–5.81)
RBC: 3.93 MIL/uL — ABNORMAL LOW (ref 4.22–5.81)
RBC: 3.98 MIL/uL — ABNORMAL LOW (ref 4.22–5.81)
RBC: 4.14 MIL/uL — ABNORMAL LOW (ref 4.22–5.81)
RDW: 12.6 % (ref 11.5–15.5)
RDW: 12.6 % (ref 11.5–15.5)
RDW: 12.7 % (ref 11.5–15.5)
RDW: 12.8 % (ref 11.5–15.5)
RDW: 12.8 % (ref 11.5–15.5)
RDW: 12.9 % (ref 11.5–15.5)
RDW: 13 % (ref 11.5–15.5)
RDW: 13 % (ref 11.5–15.5)
RDW: 13 % (ref 11.5–15.5)
RDW: 13.1 % (ref 11.5–15.5)
RDW: 13.8 % (ref 11.5–15.5)
WBC: 11 10*3/uL — ABNORMAL HIGH (ref 4.0–10.5)
WBC: 6.3 10*3/uL (ref 4.0–10.5)
WBC: 6.5 10*3/uL (ref 4.0–10.5)
WBC: 7.9 10*3/uL (ref 4.0–10.5)
WBC: 8 10*3/uL (ref 4.0–10.5)
WBC: 8.8 10*3/uL (ref 4.0–10.5)
WBC: 9.2 10*3/uL (ref 4.0–10.5)
WBC: 9.2 10*3/uL (ref 4.0–10.5)
WBC: 9.5 10*3/uL (ref 4.0–10.5)
WBC: 9.8 10*3/uL (ref 4.0–10.5)

## 2010-05-31 LAB — BASIC METABOLIC PANEL
BUN: 16 mg/dL (ref 6–23)
BUN: 16 mg/dL (ref 6–23)
BUN: 19 mg/dL (ref 6–23)
BUN: 24 mg/dL — ABNORMAL HIGH (ref 6–23)
BUN: 35 mg/dL — ABNORMAL HIGH (ref 6–23)
BUN: 38 mg/dL — ABNORMAL HIGH (ref 6–23)
CO2: 24 mEq/L (ref 19–32)
CO2: 25 mEq/L (ref 19–32)
CO2: 25 mEq/L (ref 19–32)
CO2: 27 mEq/L (ref 19–32)
CO2: 30 mEq/L (ref 19–32)
CO2: 31 mEq/L (ref 19–32)
CO2: 33 mEq/L — ABNORMAL HIGH (ref 19–32)
Calcium: 7.9 mg/dL — ABNORMAL LOW (ref 8.4–10.5)
Calcium: 8.1 mg/dL — ABNORMAL LOW (ref 8.4–10.5)
Calcium: 8.3 mg/dL — ABNORMAL LOW (ref 8.4–10.5)
Calcium: 8.4 mg/dL (ref 8.4–10.5)
Calcium: 8.5 mg/dL (ref 8.4–10.5)
Calcium: 8.7 mg/dL (ref 8.4–10.5)
Chloride: 104 mEq/L (ref 96–112)
Chloride: 104 mEq/L (ref 96–112)
Chloride: 105 mEq/L (ref 96–112)
Chloride: 106 mEq/L (ref 96–112)
Chloride: 107 mEq/L (ref 96–112)
Chloride: 107 mEq/L (ref 96–112)
Chloride: 109 mEq/L (ref 96–112)
Creatinine, Ser: 1 mg/dL (ref 0.4–1.5)
Creatinine, Ser: 1.03 mg/dL (ref 0.4–1.5)
Creatinine, Ser: 1.04 mg/dL (ref 0.4–1.5)
Creatinine, Ser: 1.14 mg/dL (ref 0.4–1.5)
Creatinine, Ser: 1.24 mg/dL (ref 0.4–1.5)
Creatinine, Ser: 1.31 mg/dL (ref 0.4–1.5)
Creatinine, Ser: 1.47 mg/dL (ref 0.4–1.5)
Creatinine, Ser: 1.5 mg/dL (ref 0.4–1.5)
GFR calc Af Amer: 54 mL/min — ABNORMAL LOW (ref >60)
GFR calc Af Amer: 55 mL/min — ABNORMAL LOW (ref >60)
GFR calc Af Amer: >60 mL/min (ref >60)
GFR calc Af Amer: >60 mL/min (ref >60)
GFR calc Af Amer: >60 mL/min (ref >60)
GFR calc Af Amer: >60 mL/min (ref >60)
GFR calc Af Amer: >60 mL/min (ref >60)
GFR calc non Af Amer: 45 mL/min — ABNORMAL LOW (ref >60)
GFR calc non Af Amer: 46 mL/min — ABNORMAL LOW (ref >60)
GFR calc non Af Amer: 52 mL/min — ABNORMAL LOW (ref >60)
GFR calc non Af Amer: >60 mL/min (ref >60)
GFR calc non Af Amer: >60 mL/min (ref >60)
GFR calc non Af Amer: >60 mL/min (ref >60)
Glucose, Bld: 107 mg/dL — ABNORMAL HIGH (ref 70–99)
Glucose, Bld: 112 mg/dL — ABNORMAL HIGH (ref 70–99)
Glucose, Bld: 128 mg/dL — ABNORMAL HIGH (ref 70–99)
Glucose, Bld: 170 mg/dL — ABNORMAL HIGH (ref 70–99)
Glucose, Bld: 86 mg/dL (ref 70–99)
Glucose, Bld: 99 mg/dL (ref 70–99)
Potassium: 3.6 mEq/L (ref 3.5–5.1)
Potassium: 3.7 mEq/L (ref 3.5–5.1)
Potassium: 3.8 mEq/L (ref 3.5–5.1)
Potassium: 4 mEq/L (ref 3.5–5.1)
Potassium: 4 mEq/L (ref 3.5–5.1)
Potassium: 4 mEq/L (ref 3.5–5.1)
Potassium: 4.5 mEq/L (ref 3.5–5.1)
Sodium: 136 mEq/L (ref 135–145)
Sodium: 136 mEq/L (ref 135–145)
Sodium: 138 mEq/L (ref 135–145)
Sodium: 138 mEq/L (ref 135–145)
Sodium: 142 mEq/L (ref 135–145)
Sodium: 143 mEq/L (ref 135–145)
Sodium: 144 mEq/L (ref 135–145)

## 2010-05-31 LAB — POCT I-STAT 4, (NA,K, GLUC, HGB,HCT)
Glucose, Bld: 123 mg/dL — ABNORMAL HIGH (ref 70–99)
Glucose, Bld: 124 mg/dL — ABNORMAL HIGH (ref 70–99)
Glucose, Bld: 126 mg/dL — ABNORMAL HIGH (ref 70–99)
Glucose, Bld: 138 mg/dL — ABNORMAL HIGH (ref 70–99)
HCT: 24 % — ABNORMAL LOW (ref 39.0–52.0)
HCT: 30 % — ABNORMAL LOW (ref 39.0–52.0)
HCT: 38 % — ABNORMAL LOW (ref 39.0–52.0)
Hemoglobin: 10.2 g/dL — ABNORMAL LOW (ref 13.0–17.0)
Hemoglobin: 12.6 g/dL — ABNORMAL LOW (ref 13.0–17.0)
Hemoglobin: 12.9 g/dL — ABNORMAL LOW (ref 13.0–17.0)
Hemoglobin: 8.2 g/dL — ABNORMAL LOW (ref 13.0–17.0)
Hemoglobin: 8.5 g/dL — ABNORMAL LOW (ref 13.0–17.0)
Potassium: 3.4 mEq/L — ABNORMAL LOW (ref 3.5–5.1)
Potassium: 3.6 mEq/L (ref 3.5–5.1)
Potassium: 4.1 mEq/L (ref 3.5–5.1)
Sodium: 135 mEq/L (ref 135–145)
Sodium: 136 mEq/L (ref 135–145)
Sodium: 137 mEq/L (ref 135–145)
Sodium: 138 mEq/L (ref 135–145)
Sodium: 139 mEq/L (ref 135–145)

## 2010-05-31 LAB — PROTIME-INR
INR: 1.1 (ref 0.00–1.49)
INR: 1.6 — ABNORMAL HIGH (ref 0.00–1.49)
Prothrombin Time: 14.9 seconds (ref 11.6–15.2)
Prothrombin Time: 20.1 seconds — ABNORMAL HIGH (ref 11.6–15.2)

## 2010-05-31 LAB — GLUCOSE, CAPILLARY
Glucose-Capillary: 102 mg/dL — ABNORMAL HIGH (ref 70–99)
Glucose-Capillary: 103 mg/dL — ABNORMAL HIGH (ref 70–99)
Glucose-Capillary: 104 mg/dL — ABNORMAL HIGH (ref 70–99)
Glucose-Capillary: 105 mg/dL — ABNORMAL HIGH (ref 70–99)
Glucose-Capillary: 109 mg/dL — ABNORMAL HIGH (ref 70–99)
Glucose-Capillary: 112 mg/dL — ABNORMAL HIGH (ref 70–99)
Glucose-Capillary: 114 mg/dL — ABNORMAL HIGH (ref 70–99)
Glucose-Capillary: 116 mg/dL — ABNORMAL HIGH (ref 70–99)
Glucose-Capillary: 124 mg/dL — ABNORMAL HIGH (ref 70–99)
Glucose-Capillary: 131 mg/dL — ABNORMAL HIGH (ref 70–99)
Glucose-Capillary: 131 mg/dL — ABNORMAL HIGH (ref 70–99)
Glucose-Capillary: 140 mg/dL — ABNORMAL HIGH (ref 70–99)
Glucose-Capillary: 141 mg/dL — ABNORMAL HIGH (ref 70–99)
Glucose-Capillary: 155 mg/dL — ABNORMAL HIGH (ref 70–99)
Glucose-Capillary: 171 mg/dL — ABNORMAL HIGH (ref 70–99)
Glucose-Capillary: 69 mg/dL — ABNORMAL LOW (ref 70–99)
Glucose-Capillary: 96 mg/dL (ref 70–99)
Glucose-Capillary: 97 mg/dL (ref 70–99)
Glucose-Capillary: 99 mg/dL (ref 70–99)
Glucose-Capillary: 99 mg/dL (ref 70–99)

## 2010-05-31 LAB — URINALYSIS, ROUTINE W REFLEX MICROSCOPIC
Ketones, ur: NEGATIVE mg/dL
Nitrite: NEGATIVE
Protein, ur: NEGATIVE mg/dL
Urobilinogen, UA: 0.2 mg/dL (ref 0.0–1.0)
pH: 5.5 (ref 5.0–8.0)

## 2010-05-31 LAB — MAGNESIUM
Magnesium: 2 mg/dL (ref 1.5–2.5)
Magnesium: 2.5 mg/dL (ref 1.5–2.5)
Magnesium: 2.6 mg/dL — ABNORMAL HIGH (ref 1.5–2.5)
Magnesium: 2.7 mg/dL — ABNORMAL HIGH (ref 1.5–2.5)

## 2010-05-31 LAB — POCT I-STAT 3, ART BLOOD GAS (G3+)
Acid-Base Excess: 2 mmol/L (ref 0.0–2.0)
Acid-base deficit: 1 mmol/L (ref 0.0–2.0)
Bicarbonate: 22.9 mEq/L (ref 20.0–24.0)
Bicarbonate: 23.3 mEq/L (ref 20.0–24.0)
Bicarbonate: 24.3 mEq/L — ABNORMAL HIGH (ref 20.0–24.0)
Bicarbonate: 24.3 mEq/L — ABNORMAL HIGH (ref 20.0–24.0)
Bicarbonate: 24.4 mEq/L — ABNORMAL HIGH (ref 20.0–24.0)
Bicarbonate: 25.3 mEq/L — ABNORMAL HIGH (ref 20.0–24.0)
Bicarbonate: 26.1 mEq/L — ABNORMAL HIGH (ref 20.0–24.0)
O2 Saturation: 100 %
O2 Saturation: 100 %
O2 Saturation: 100 %
O2 Saturation: 88 %
O2 Saturation: 93 %
O2 Saturation: 94 %
Patient temperature: 36
Patient temperature: 36.7
TCO2: 24 mmol/L (ref 0–100)
TCO2: 25 mmol/L (ref 0–100)
TCO2: 26 mmol/L (ref 0–100)
TCO2: 26 mmol/L (ref 0–100)
TCO2: 27 mmol/L (ref 0–100)
TCO2: 27 mmol/L (ref 0–100)
pCO2 arterial: 37.3 mmHg (ref 35.0–45.0)
pCO2 arterial: 41.1 mmHg (ref 35.0–45.0)
pCO2 arterial: 41.9 mmHg (ref 35.0–45.0)
pH, Arterial: 7.379 (ref 7.350–7.450)
pH, Arterial: 7.38 (ref 7.350–7.450)
pH, Arterial: 7.422 (ref 7.350–7.450)
pH, Arterial: 7.429 (ref 7.350–7.450)
pO2, Arterial: 57 mmHg — ABNORMAL LOW (ref 80.0–100.0)
pO2, Arterial: 60 mmHg — ABNORMAL LOW (ref 80.0–100.0)
pO2, Arterial: 70 mmHg — ABNORMAL LOW (ref 80.0–100.0)

## 2010-05-31 LAB — BLOOD GAS, ARTERIAL
Acid-Base Excess: 0.4 mmol/L (ref 0.0–2.0)
Bicarbonate: 24.3 mEq/L — ABNORMAL HIGH (ref 20.0–24.0)
Drawn by: 283401
O2 Content: 3 L/min
O2 Saturation: 92.6 %
Patient temperature: 98.6
TCO2: 25.5 mmol/L (ref 0–100)
pCO2 arterial: 38.1 mmHg (ref 35.0–45.0)
pH, Arterial: 7.421 (ref 7.350–7.450)
pO2, Arterial: 65.3 mmHg — ABNORMAL LOW (ref 80.0–100.0)

## 2010-05-31 LAB — CROSSMATCH
ABO/RH(D): AB POS
ABO/RH(D): AB POS
Antibody Screen: NEGATIVE
Antibody Screen: NEGATIVE

## 2010-05-31 LAB — HEPARIN LEVEL (UNFRACTIONATED)
Heparin Unfractionated: 0.43 IU/mL (ref 0.30–0.70)
Heparin Unfractionated: 0.56 IU/mL (ref 0.30–0.70)
Heparin Unfractionated: 0.59 IU/mL (ref 0.30–0.70)
Heparin Unfractionated: 0.74 IU/mL — ABNORMAL HIGH (ref 0.30–0.70)

## 2010-05-31 LAB — COMPREHENSIVE METABOLIC PANEL
ALT: 16 U/L (ref 0–53)
AST: 23 U/L (ref 0–37)
Albumin: 2.8 g/dL — ABNORMAL LOW (ref 3.5–5.2)
Alkaline Phosphatase: 46 U/L (ref 39–117)
BUN: 16 mg/dL (ref 6–23)
CO2: 24 mEq/L (ref 19–32)
Calcium: 8.5 mg/dL (ref 8.4–10.5)
Chloride: 108 mEq/L (ref 96–112)
Creatinine, Ser: 1.02 mg/dL (ref 0.4–1.5)
GFR calc Af Amer: >60 mL/min (ref >60)
GFR calc non Af Amer: >60 mL/min (ref >60)
Glucose, Bld: 90 mg/dL (ref 70–99)
Potassium: 3.7 mEq/L (ref 3.5–5.1)
Sodium: 138 mEq/L (ref 135–145)
Total Bilirubin: 0.8 mg/dL (ref 0.3–1.2)
Total Protein: 5.6 g/dL — ABNORMAL LOW (ref 6.0–8.3)

## 2010-05-31 LAB — POCT I-STAT 3, VENOUS BLOOD GAS (G3P V)
TCO2: 26 mmol/L (ref 0–100)
pCO2, Ven: 40.9 mmHg — ABNORMAL LOW (ref 45.0–50.0)
pH, Ven: 7.388 — ABNORMAL HIGH (ref 7.250–7.300)

## 2010-05-31 LAB — PREPARE FRESH FROZEN PLASMA

## 2010-05-31 LAB — HEMOGLOBIN A1C
Hgb A1c MFr Bld: 5.8 % (ref 4.6–6.1)
Mean Plasma Glucose: 120 mg/dL

## 2010-05-31 LAB — CREATININE, SERUM
Creatinine, Ser: 0.97 mg/dL (ref 0.4–1.5)
Creatinine, Ser: 1.25 mg/dL (ref 0.4–1.5)
GFR calc Af Amer: >60 mL/min (ref >60)
GFR calc Af Amer: >60 mL/min (ref >60)
GFR calc non Af Amer: 55 mL/min — ABNORMAL LOW (ref >60)
GFR calc non Af Amer: >60 mL/min (ref >60)

## 2010-05-31 LAB — APTT
aPTT: 115 seconds — ABNORMAL HIGH (ref 24–37)
aPTT: 41 seconds — ABNORMAL HIGH (ref 24–37)

## 2010-05-31 LAB — POCT I-STAT, CHEM 8
Calcium, Ion: 1.2 mmol/L (ref 1.12–1.32)
Chloride: 104 mEq/L (ref 96–112)
Creatinine, Ser: 1.2 mg/dL (ref 0.4–1.5)
Glucose, Bld: 115 mg/dL — ABNORMAL HIGH (ref 70–99)
Glucose, Bld: 149 mg/dL — ABNORMAL HIGH (ref 70–99)
Hemoglobin: 8.5 g/dL — ABNORMAL LOW (ref 13.0–17.0)
Potassium: 4 mEq/L (ref 3.5–5.1)
Potassium: 4.6 mEq/L (ref 3.5–5.1)
TCO2: 25 mmol/L (ref 0–100)

## 2010-05-31 LAB — PREPARE PLATELETS

## 2010-05-31 LAB — POCT I-STAT GLUCOSE
Glucose, Bld: 117 mg/dL — ABNORMAL HIGH (ref 70–99)
Operator id: 3406

## 2010-05-31 LAB — ABO/RH: ABO/RH(D): AB POS

## 2010-05-31 LAB — PLATELET COUNT: Platelets: 112 10*3/uL — ABNORMAL LOW (ref 150–400)

## 2010-05-31 LAB — BRAIN NATRIURETIC PEPTIDE
Pro B Natriuretic peptide (BNP): 797 pg/mL — ABNORMAL HIGH (ref 0.0–100.0)
Pro B Natriuretic peptide (BNP): 807 pg/mL — ABNORMAL HIGH (ref 0.0–100.0)
Pro B Natriuretic peptide (BNP): 863 pg/mL — ABNORMAL HIGH (ref 0.0–100.0)

## 2010-07-04 NOTE — H&P (Signed)
NAME:  [name], [name] NO.:  [id number]   MEDICAL RECORD NO.:  [id number]          PATIENT TYPE:  EMS   LOCATION:  [facility]                         FACILITY:  [facility]   PHYSICIAN:  Massie Maroon, MD        DATE OF BIRTH:  Apr 16, 1921   DATE OF ADMISSION:  09/17/2008  DATE OF DISCHARGE:                              HISTORY & PHYSICAL   CHIEF COMPLAINT:  Cough.   HISTORY OF PRESENT ILLNESS:  This is an 77 year old male with a history  of COPD,  on home O2 who apparently was at pulmonary cardiac rehab when  he had slightly low pulse ox.  He was brought to the ER for evaluation.  Chest x-ray was negative.  However CTA was performed and found negative  for pulmonary embolus but there was some parenchymal opacity in the  posterior right lower lung which was possibly atelectasis versus early  pneumonia.  There was also noted to be a nodular opacity 10 mm at the  right lung base, possibly concerning for bronchogenic carcinoma.  The  white count was within normal limits.  D-dimer was elevated and that is  what prompted CTA chest.  BNP was 365.  There was no baseline BNP to  compare.  The patient notes that he has been coughing for a couple of  days.  He is bringing up green sputum.  The patient notes some slight  shortness of breath and wheezing.  The patient denies any fever, chills,  chest pain, palpitations, nausea, vomiting.  He does note slightly loose  stool but denies any bright red blood per rectum or black stool.  The  patient will be admitted for possible early pneumonia but more likely  COPD exacerbation.   PAST MEDICAL HISTORY:  Diabetes, hypertension, hyperlipidemia, CAD,  carotid stenosis, cataracts and carpal tunnel, double vision, mild COPD  on home O2, history of remote tobacco abuse, quit in 1980 and  multivessel coronary artery disease (see below).   PAST SURGICAL HISTORY:  1. Appendectomy.  2. Left carpal tunnel release.  3. Left carotid endarterectomy with  Dacron patch angioplasty March 17, 2001.  4. Cardiac catheterization June 10, 2008 by Dr. Nicki Guadalajara  which      showed moderate severe LVEF with estimated EF 30 to 35%, moderately      severe hypokinesis to akinesis in the mid to basal inferior wall      and hypocontractility involving the distal anterolateral wall with      severe hypokinesis to akinesis involving the inferior apical low to      mid posterior wall, severe native coronary disease with coronary      calcification and evidence for distal 90 to 95% left main stenosis      eccentrically, 30% proximal left anterior descending stenosis,      segmental 95 to 90% circumflex stenosis, total proximal right      coronary artery occlusion with extensive left-to-right      collateralization of the distal right coronary artery with good  distal target vessels, moderate pulmonary hypertension with      pulmonary artery pressure of 44 mm systolically, moderately severe      aortic valve stenosis with reduced aortic excursion, aortic valve      calcification and calculated aortic valve area of 0.9 cm2.  5. CABG x4 (left internal mammary artery to LAD, saphenous venous      graft to diagonal, saphenous venous graft to circumflex marginal,      saphenous venous graft to posterior descending (aortic valve      replacement with a 21 mm pericardial tissue valve Edwards mode      3300, size 21 mm, serial number [id number]).  6. Endoscopic harvest of the right leg greater saphenous vein.  7. Moderate to severe aortic stenosis.   SOCIAL HISTORY:  The patient smoked one pack per day times 25 years.  He  is a former smoker.  He does not drink.   FAMILY HISTORY:  Positive for heart disease.   REVIEW OF SYSTEMS:  Negative for all 10 organ systems except for  pertinent positives stated above.   PHYSICAL EXAMINATION:  Temperature 97.5, pulse 91, respiratory rate 14,  pulse ox 97% on room air.  HEENT:  Anicteric, EOMI, no nystagmus,  pupils 1.5 mm, symmetric, direct,  consensual, near reflexes intact.  Mucous membranes moist.  NECK:  No JVD, no bruit, no thyromegaly, no adenopathy.  HEART:  Midline scar. Regular rate and rhythm.  S1-S2.  LUNGS:  Slight expiratory wheezes bilaterally, slight tightness, no  crackles.  ABDOMEN:  Soft, nontender, nondistended.  Positive bowel sounds.  EXTREMITIES:  No cyanosis, clubbing or edema, DP pulses 2+ bilaterally.  SKIN:  No rashes.  LYMPHATICS:  Lymph nodes -  no adenopathy.  NEUROLOGIC:  Nonfocal.  PSYCHIATRIC:  Alert and oriented x3.   LABS:  BNP 365, D-dimer 1.32 (high),  WBC 5.1, hemoglobin 40.0, platelet  count 175.  Sodium 144, potassium 4.2, chloride 106, bicarb 31, BUN 21, creatinine  1.17, glucose 139.  AST 36, ALT 16.   Chest x-ray:  Negative.  CTA chest:  Negative PE, nodular opacity in the  right lower lung base, 10 mm, some parenchymal opacity of the posterior  right lower lung likely atelectasis versus pneumonia, partial T8  compression of uncertain duration.   Urinalysis negative.   ASSESSMENT/PLAN:  1. Dyspnea likely secondary to mild chronic obstructive pulmonary      disease exacerbation.  2. Pneumonia versus bronchitis.  The patient will be started on Avelox      400 mg IV q. day.  The patient will be started on prednisone 60 mg      p.o. daily for mild COPD exacerbation.  The patient will be started      on Combivent nebs q.6 h.  3. Coronary artery disease  status post coronary artery bypass graft,      aortic valve replacement:  The patient will continue on carvedilol      6.25 mg b.i.d.,  Lipitor 80 mg q.h.s., and enteric-coated aspirin      81 mg p.o. daily.  4. Diabetes:  The patient will continue on glipizide 5 mg p.o. daily      and fingersticks a.c. and h.s.  Will use insulin sliding scale.  5. DVT prophylaxis:  Lovenox 40 mg subcu daily.      Massie Maroon, MD  Electronically Signed     JYK/MEDQ  D:  09/17/2008  T:  09/17/2008  Job:   [id number]  cc:   Kerin Perna, M.D.  Nicki Guadalajara, M.D.  Thereasa Solo. Little, M.D.  Ernestina Penna, M.D.

## 2010-07-04 NOTE — Discharge Summary (Signed)
NAME:  [name], TRIGGER FRASIER NO.:  [id number]   MEDICAL RECORD NO.:  [id number]          PATIENT TYPE:  INP   LOCATION:  2012                         FACILITY:  [facility]   PHYSICIAN:  Kerin Perna, M.D.  DATE OF BIRTH:  May 31, 1921   DATE OF ADMISSION:  06/10/2008  DATE OF DISCHARGE:                               DISCHARGE SUMMARY   ADDENDUM   The patient was set up and dictated for discharge home on Monday, Jun 21, 2008.  Felt that the patient is not ready for discharge today secondary  to remaining on oxygen and home health not arranged.  He did well over  the weekend.  His blood pressure was noted to be elevated and his Coreg  was increased.  The patient's heart rate remained stable in normal sinus  rhythm.  Blood pressure improves with increasing a beta-blocker and was  followed closely.  The patient's blood sugars were continued to be  followed.  They stabilized by Jun 21, 2008.  Plan to continue him at home  with glipizide.  A followup chest x-ray done on postop day 7, Monday  showed a left effusion with atelectasis but improving aeration.  He  remained on 3 L nasal cannula with O2 sats 94%.  He continued to use  some incentive spirometer.  Due to unable to be wean the patient off  oxygen felt that he will require home O2.  Chief management was  consulted to arrange for home O2 as well as home health nurse.  The  patient continued to work with cardiac rehab.  He is felt to be  ambulating well with assistance.  He is tolerating diet well.  No  nausea, vomiting noted.  All incisions remained clean, dry, and intact,  and healing well.   On Jun 21, 2008, postop day 7, the patient was noted to be afebrile.  His  blood pressure is stable.  He is in normal sinus rhythm.  His O2 sats  94% on 3 L.   Most recent lab work shows a white blood cell count 5.7, hemoglobin  10.2, hematocrit 29.2, and platelet count 203.  Sodium of 141, potassium  4.5, chloride 107, bicarb 20,  BUN is 30, creatinine 1.42, and glucose of  124.  The patient felt to be ready for discharge to home on postop day 8  on Jun 22, 2008, in the a.m. with the home health nurse and home O2.   Please see dictated discharge summary for followup appointments and  discharge instructions.   DISCHARGE MEDICATIONS:  1. Aspirin 81 mg daily.  2. Vitamin B 150 mg daily.  3. Vitamin D 800 units daily.  4. Lipitor 80 mg daily.  5. Glipizide 5 mg daily.  6. Nu-Iron 150 mg daily.  7. Coreg 12.5 mg b.i.d.  8. Ultram 50 mg q.6 h. p.r.n. pain.  9. Lasix 40 mg daily x5 days.  10.Potassium chloride 20 mEq daily x5 days.  11.Ensure one can b.i.d.      Sol Blazing, PA      Kerin Perna, M.D.  Electronically Signed    KMD/MEDQ  D:  06/21/2008  T:  06/21/2008  Job:  [id number]   cc:   Thereasa Solo. Little, M.D.

## 2010-07-04 NOTE — Assessment & Plan Note (Signed)
OFFICE VISIT   [name], [name]  DOB:  03/21/1921                                        Jul 02, 2008  CHART #:  [id number]   CURRENT PROBLEMS:  1. Status post aortic valve replacement, coronary artery bypass graft      x4 on June 14, 2008, for left main and three-vessel coronary      artery disease, unstable angina, moderate-to-severe aortic      stenosis.  2. Chronic obstructive pulmonary disease requiring home oxygen      following discharge.  3. Right ankle swelling at the site of the vein harvest.   PRESENT ILLNESS:  The patient is an 77 year old gentleman, who returns  to the office with concern of a swelling of his right foot after AVR  CABG 2-1/2 weeks ago.  He has maintained a sinus rhythm and has been  treated with oral Lasix and potassium as well as Coreg, iron, glipizide,  Lipitor, 81 aspirin, and p.r.n. Ultram.  He is walking, but he still  requires home oxygen therapy.  His appetite is good and his weight has  been stable.  His incisions have been healing and denies fever.  Home  health nursing is following the patient as well as home physical  therapy.  He returns to the office to have his wounds checked and to  discuss ankle swelling.   PHYSICAL EXAMINATION:  VITAL SIGNS:  Blood pressure 120/70, pulse 90,  respirations 18, saturation 96%, temperature is 98.9, and his O2  saturation on 2 liters nasal cannula is 96%.  LUNGS:  His breath sounds are clear.  CARDIAC:  Heart rate is regular.  SKIN:  The sternal incision and leg incisions are healing well.  EXTREMITIES:  The left leg has no edema, but the right ankle has 2+  edema.  He is well perfused.   PA and lateral chest x-ray reveal some COPD, no significant effusions or  infiltrate.   IMPRESSION AND PLAN:  The patient is making slow, but steady progress.  He does appear to have some mild fluid overload and I will add Zaroxolyn  5 mg a day to his Lasix daily dose for the next 3 days.  We  will  continue his home therapies and I will see him back in 2 weeks with a  followup chest x-ray.  I reassured him that ankle swelling is a result  of the vein harvest and I reinforced the importance of leg elevation and  I also suggested a support stocking to wear on the leg up to the knee  during the days, to be removed at night.   Kerin Perna, M.D.  Electronically Signed   PV/MEDQ  D:  07/02/2008  T:  07/03/2008  Job:  [id number]   cc:   Antonieta Iba, MD  [facility]

## 2010-07-04 NOTE — Discharge Summary (Signed)
NAME:  [name], [name] NO.:  [id number]   MEDICAL RECORD NO.:  [id number]          PATIENT TYPE:  INP   LOCATION:  2015                         FACILITY:  [facility]   PHYSICIAN:  Kerin Perna, M.D.  DATE OF BIRTH:  07-Aug-1921   DATE OF ADMISSION:  06/10/2008  DATE OF DISCHARGE:                               DISCHARGE SUMMARY   ADMITTING DIAGNOSES:  1. Multivessel coronary artery disease (left main included with an      ejection fraction of 30-35%).  2. Moderate-to-severe Aortic stenosis.  3. History of non-insulin-dependent diabetes mellitus.  4. History of hypertension.  5. History of dyslipidemia.  6. History of mild chronic obstructive pulmonary disease.  7. History of remote tobacco abuse (quit 1980).   DISCHARGE DIAGNOSES:  1. Multivessel coronary artery disease (left main included with an      ejection fraction of 30-35%).  2. Moderate-to-severe Aortic stenosis.  3. History of non-insulin-dependent diabetes mellitus.  4. History of hypertension.  5. History of dyslipidemia.  6. History of mild chronic obstructive pulmonary disease.  7. History of remote tobacco abuse (quit 1980).  8. Mild renal insufficiency.   PROCEDURES:  1. Cardiac catheterization performed by Dr. Tresa Endo on June 10, 2008.      For specifics, please see the medical record.  In brief, there was      an 80-90% left main stenosis with a total occlusion of the RCA and      subtotaled disease of the left coronary circumflex LAD.  EF was 30-      35%.  2. CABG x4 (LIMA to LAD, SVG to diagonal, SVG to circumflex marginal,      SVG to PDA with [facility] of the right lower extremity, and aortic valve      replacement (21-mm pericardial tissue valve) on June 14, 2008, by      Dr. Donata Clay.   HISTORY OF PRESENT ILLNESS:  This is an 77 year old Caucasian male with  a past medical history of non-insulin-dependent diabetes mellitus,  hypertension, dyslipidemia, and mild COPD (previous asbestos  exposure)  who has been followed for cardiac murmur over the last 15 years.  He  recently has developed exertional dyspnea and decreasing exercise  tolerance.  A stress test was performed which was significantly positive  for ischemia in the inferolateral and anterior basilar regions.  His EF  on the radionucleotide scan was estimated to be 29%.  He was brought to  [facility] on June 10, 2008, to undergo an elective cardiac  catheterization.  This demonstrated multivessel coronary artery disease  (with left main stenosis included) with an EF of 30-35%.  He was also  found to have a transvalvular gradient of 24 mmHg across her aortic  valve with a calculated area of 0.9.  His PA pressures were 40 and 20  and his cardiac output was 4.5 liters per minute.  A cardiothoracic  consultation was obtained with Dr. Donata Clay.  Preoperative duplex  carotid ultrasound revealed a 60-80% right internal carotid artery  stenosis and a 40-60% left internal carotid artery stenosis.  The  patient underwent the aforementioned CABG x4 and AVR on June 14, 2008.   BRIEF HOSPITAL COURSE STAY:  The patient remained afebrile and  hemodynamically stable postoperatively.  He was extubated late the  evening of surgery without difficulty.  He was weaned off his drips as  tolerated.  His A-line, Swan, and chest tubes were removed early in his  postoperative course.  He was volume overloaded.  He was placed on a  Lasix drip initially.  He was then given p.o. Lasix.  He was also found  to acute blood loss anemia postoperatively.  He was started on iron.  His H and H were monitored carefully.  He had already been placed on  Lopressor, and an ACE inhibitor was also initiated.  However, because  his creatinine began to increase, this was discontinued.  He was then  transferred from the Intensive Care Unit to [facility] for further  convalescence.  He continued to slowly progress with cardiac rehab.  Currently, on postoperative  day #5, he is tolerating a diet.  His preop  weight is 76 kg, today's weight is down to 78 kg.  CBGs are 151, 100,  140 respectively.   Last BMET on Jun 19, 2008; potassium 4, BUN and creatinine 40 and 1.5  respectively.  Last CBC done on June 18, 2008, H and H 9.8 and 20  respectively, white count of 8800, and platelet count 115,000.  BMET  will be checked prior to discharge.  Last chest x-ray done on June 18, 2008, showed interval improvement in pulmonary edema, improved in  bilateral pleural effusions and atelectasis.   PHYSICAL EXAMINATION:  VITAL SIGNS:  He is afebrile, BP 117/66, heart  rate in the 80s, O2 saturation 92-94% on 1 liter nasal cannula.  CARDIOVASCULAR:  Regular rate and rhythm.  PULMONARY:  Decreased at the bases.  ABDOMEN:  Soft and nontender.  Bowel sounds present.  EXTREMITIES:  Trace lower extremity edema.  SKIN:  His wounds are clean and dry.   Provided he is weaned off oxygen and he remains afebrile and  hemodynamically stable, he will be discharged on Jun 21, 2008.   DISCHARGED INSTRUCTIONS:  The patient is to remain on a low-fat, low-  salt, carbohydrate-modified medium caloric diet.  He is not to drive or  lift more than 10 pounds.  He is to continue with his breathing exercise  daily.  He is to walk every day and increase frequency and duration as  tolerated.  He may shower.  He is to clean his wounds with mild soap and  water.  He is to call the office if wound problems arise.   FOLLOWUP APPOINTMENTS:  The patient needs to contact Dr. Fredirick Maudlin office  for a followup appointment in weeks.  The office will contact the  patient for an appointment to see Dr. Donata Clay in 3 weeks and prior to  this office appointment, a chest x-ray will be obtained.   DISCHARGE MEDICATIONS:  1. Enteric-coated aspirin 81 mg p.o. daily.  2. Vitamin B1, 150 p.o. daily.  3. Vitamin D 800 international units p.o. daily.  4. Lipitor 80 mg p.o. at bedtime.  5. Nu-Iron 150 mg  p.o. daily.  6. Glipizide 5 mg p.o. daily.  7. Coreg 6.25 mg p.o. daily.  8. Ultram 50 mg 1 tablet p.o. q.6 h as needed for pain.       Doree Fudge, [place]      Kerin Perna, M.D.  Electronically Signed    DZ/MEDQ  D:  06/19/2008  T:  06/20/2008  Job:  [id number]   cc:   Thereasa Solo. Little, M.D.

## 2010-07-04 NOTE — Cardiovascular Report (Signed)
NAME:  [name], [name] NEAR NO.:  [id number]   MEDICAL RECORD NO.:  [id number]          PATIENT TYPE:  INP   LOCATION:  2925                         FACILITY:  [facility]   PHYSICIAN:  Nicki Guadalajara, M.D.     DATE OF BIRTH:  1921/10/31   DATE OF PROCEDURE:  DATE OF DISCHARGE:                            CARDIAC CATHETERIZATION   INDICATIONS:  [name] is an 77 year old, patient of Dr. Julieanne Manson, who has a history of cardiac murmur for at least 15 years.  He  has developed recent increasing exertional dyspnea.  In April 2010, a  nuclear perfusion study was high risk and showed perfusion defect, felt  to be due to infarcts/scar with periinfarction ischemia in the basal  inferolateral, basal anterolateral, mid inferolateral, mid  anterolateral, basal inferoseptal, basal inferior, mid inferoseptal, mid  inferior, and apical inferior segment.  Post stress ejection fraction  was 29%.  An echo Doppler study apparently in February 2010 had shown  normal systolic function with evidence for diastolic dysfunction.  At  that time, his mean transvalvular aortic gradient was 28 mm with a peak  instantaneous gradient of 43 mm.  He had mild pulmonary hypertension  with an estimated RV systolic pressure of 31 mm.  With recent  symptomatology and abnormal stress study now demonstrating reduced LV  function.  He is referred by Dr. Clarene Duke for right and left heart  catheterization.  He was scheduled for me to do this week in Dr.  Fredirick Maudlin absence.   PROCEDURE:  After premedication with Valium 4 mg intravenously, the  patient was prepped and draped in usual fashion.  His right femoral  artery and right femoral vein were punctured anteriorly and a 6-French  arterial sheath and 7-French venous sheath was inserted.  A Swan-Ganz  catheter was inserted into the venous sheath and advanced to the RA, RV,  PA, and PC positions under hemodynamic monitoring.  Thermodilution  outputs was  obtained and also Fick cardiac output was assessed.  Due to  tortuosity in the iliac system and distal aorta, a Glidewire was  necessary and the Glidewire was able to be advanced up the aorta and a  pigtail catheter was inserted.  Simultaneous PA-AO pressures were  recorded.  A straight wire was then advanced into the pigtail catheter  and multiple attempts at attempting to cross this aortic valve were  unsuccessful.  Central aortography was then performed to assess aortic  valve excursion, potential for aortic insufficiency, and used as a  guidance to help with crossing the valve.  Using exchange technique, a  right coronary catheter was then inserted.  A straight wire was  ultimately able to cross the aortic valve and the right catheter was  advanced into the left ventricle.  Long exchange wire was then used and  the right catheter was removed and exchanged for the pigtail catheter.  Simultaneous LV and PC pressures were recorded.  Also, simultaneous LV  and femoral artery pressures were recorded with the 5-French pigtail  catheter and 6-French arterial sheath.  A right heart pullback was then  performed.  Biplane cine left ventriculography was done, and LV to AO  pullback was performed and then simultaneous AO-FA pressures were  recorded.  Due to the tortuosity in the iliac distal aortic region,  distal aortography was done without difficulty.  Again using a long  exchange wire technique, the 5-French left coronary catheter was then  inserted and selective angiography in the left coronary system was  obtained.  A 5-French RCA catheter was then inserted and selective  angiography in the right coronary artery was performed.  The patient  tolerated the procedure well.  Due to the severity of his coronary  anatomy coupled with his aortic stenosis, he will be admitted to the TCU  with plans for heparinization several hours after sheath pull, and Dr.  Donata Clay was also notified concerning  his significant anatomy to  evaluate him for CABG/AVR treatment.   HEMODYNAMIC DATA:  Right atrial pressure 6, right ventricle pressure  44/5, PA pressure 44/14, mean pulmonary capillary wedge pressure 15.   Initial central aortic pressure was 120/61.   Simultaneous LV-FA pressures were 144/14 and 109/52 giving a gradient of  35 mm peak-to-peak.  On LV-AO pullback, left ventricle pressure was  142/14, central aortic pressure was 120/60, giving a peak-to-peak  gradient of 22 mm.  O2 saturation was 75% in the pulmonary artery and  94% in the aorta.   Cardiac output by the Fick method was 6 liters per minute and by the  thermodilution method, it was 5 liters per minute.  Cardiac index was  3.5 and 3.0 liters per minute per meter squared respectively.  Aortic  valve area was 0.9 sq cm.   Biplane cine left ventriculography revealed moderately severe LV  dysfunction with an ejection fraction of approximately 30%.  There was  moderate-to-severe hypo-to-akinesis in the mid-to-basal inferior wall  and hypocontractility in the distal anterolateral wall.  On the LAO  projection, there was severe hypo-to-akinesis in the inferoapical low-to-  mid posterolateral wall.   Central aortography revealed calcified aortic valve with reduced  excursion.  Aortic insufficiency was not demonstrated.   Distal aortography demonstrated patent renal arteries bilaterally.  There was irregularity of the infrarenal aorta as well as irregularity  with 20-30% narrowing in the right common iliac artery followed by 30%  narrowing.  There was 20-30% narrowing in the left common iliac artery.   CORONARY ANGIOGRAPHIC DATA:  There was evidence for coronary  calcification.   The left main coronary artery had eccentric 90-95% distal stenosis prior  to bifurcating into the LAD and circumflex system.   The LAD was moderate-sized vessel that had 30% proximal narrowing in the  region of the first diagonal takeoff.  The  LAD extended to and wrapped  around the LV apex and there was extensive collateralization to the  distal RCA, PDA, and PLA system.   The circumflex vessel had tandem 95 and 90% proximal-to-mid stenosis  prior to bifurcating into distal OM branch.   The native right coronary artery was totally occluded proximally.   IMPRESSION:  1. Moderately severe left ventricular dysfunction with an estimated      ejection fraction of 30-35% with moderately severe hypo-to-akinesis      in the mid-to-basal inferior wall on hypocontractility involving      the distal anterolateral wall with severe hypo-to-akinesis      involving the inferoapical low-to-mid posterolateral wall.  2. Severe native coronary artery disease with coronary calcification      and evidence for  distal 90-95% left main stenosis eccentrically,      30% proximal left anterior descending stenosis; segmental 95-90%      circumflex stenosis; total proximal right coronary artery occlusion      with extensive left-to-right collateralization of the distal right      coronary artery with good distal target vessels.  3. Moderate pulmonary hypertension with pulmonary artery pressure of      44 mm systolically.  4. Moderately severe aortic valve stenosis with reduced aortic      excursion, aortic valve calcification, and calculated aortic valve      area of 0.9 sq cm.  That is irregularity of the infrarenal aorta      and aortoiliac system.   RECOMMENDATIONS:  CBG revascularization surgery/AVR with probable  porcine aortic valve in this 77 year old gentleman.           ______________________________  Nicki Guadalajara, M.D.     TK/MEDQ  D:  06/10/2008  T:  06/11/2008  Job:  [id number]   cc:   Thereasa Solo. Little, M.D.  Ernestina Penna, M.D.  Kerin Perna, M.D.

## 2010-07-04 NOTE — Op Note (Signed)
NAME:  [name], [name] NO.:  [id number]   MEDICAL RECORD NO.:  [id number]          PATIENT TYPE:  INP   LOCATION:  2311                         FACILITY:  [facility]   PHYSICIAN:  Kerin Perna, M.D.  DATE OF BIRTH:  Jun 27, 1921   DATE OF PROCEDURE:  06/14/2008  DATE OF DISCHARGE:                               OPERATIVE REPORT   OPERATION:  1. Coronary artery bypass grafting x4 (left internal mammary artery to      LAD, saphenous vein graft to diagonal, saphenous vein graft to      circumflex marginal, saphenous vein graft to posterior descending).  2. Aortic valve replacement with a 21-mm pericardial tissue valve      Edwards model 3300, size 21 mm, serial number [id number].  3. Endoscopic harvest of right leg greater saphenous vein surgeon.   SURGEON:  Kerin Perna, MD   ASSISTANT:  Sheliah Plane, MD and Sol Blazing, PA-C   ANESTHESIA:  General.   PREOPERATIVE DIAGNOSES:  Left main and severe three-vessel coronary  artery disease with unstable angina, moderate-to-severe aortic stenosis  with valve area of 0.9.   POSTOPERATIVE DIAGNOSES:  Left main and severe three-vessel coronary  artery disease with unstable angina, moderate-to-severe aortic stenosis  with valve area of 0.9.   INDICATIONS:  The patient is an 77 year old gentleman who was brought to  the hospital for an elective cardiac cath after experiencing dyspnea on  exertion, but no specific chest pain.  Serial 2-D echoes have shown  progressive aortic stenosis with a vary of between 0.85 and 1.0.  Diagnostic cath demonstrated moderate pulmonary hypertension, aortic  valve area of 0.9, and an 80% left main stenosis with three-vessel  disease including a chronic occlusion of the RCA and EF of 30-40%.  There is no significant mitral regurgitation.  Based on his coronary  anatomy and aortic valve disease, he was felt to be a candidate for  combined AVR-CABG.  Prior to surgery, I examined the  patient in his CCU  room and reviewed results of the cardiac cath with the patient and  family.  I discussed the indications and expected benefits of aortic  valve replacement and coronary bypass surgery for treatment of his  severe cardiac disease.  I reviewed the alternatives to surgical therapy  as well.  I discussed the major issues of surgery including the choice  of a bioprosthetic valve, the choice of conduits to include internal  mammary artery and endoscopically harvested vein, use of general  anesthesia and cardiopulmonary bypass, and the expected postoperative  recovery.  I also reviewed the specific risks to the patient to this  operation including risks of stroke, MI, bleeding, blood transfusion  requirement, infection, and death.  After reviewing these issues, he  demonstrated his understanding and agreed to proceed with the surgery  under what I felt was an informed consent.   OPERATIVE FINDINGS:  1. Severe calcified three-vessel coronary disease.  2. Mild-to-moderate atherosclerotic changes of the ascending aorta      with calcium in the proximal arch.  3. Successful replacement of the  stenotic aortic valve with a      bioprosthetic valve showing good function on post transesophageal      echo.  4. Intraoperative transfusion of 2 units of packed cells for      hemoglobin of 7 g.   PROCEDURE:  The patient was brought to the operative room and placed  supine on the operating table where general anesthesia was induced.  A  transesophageal echo probe was placed by the anesthesiologist, which  confirmed the preoperative diagnosis of aortic stenosis with moderate LV  dysfunction.  A sternal incision was made and the saphenous vein was  harvested endoscopically from the right leg.  The left internal mammary  artery was harvested as a pedicle graft from its origin at the  subclavian vessels.  The sternal retractor was placed and the  pericardium was opened and suspended.  The  aorta had significant  calcified disease in the proximal arch, which was avoided.  Heparin was  administered after the vein was harvested and inspected and found to be  adequate.  Purse-strings were placed in the ascending aorta and right  atrium and the patient was then cannulated and placed on cardiopulmonary  bypass.  A left ventricular vent was placed via the right superior  pulmonary vein.  The coronaries were identified for grafting and the  mammary artery and vein grafts were prepared for the distal anastomoses.  Cardioplegia catheters were placed for both antegrade aortic and  retrograde coronary sinus cardioplegia.  The patient was cooled to 30  degrees and aortic crossclamp was carefully applied in an area of non-  diseased tissue.   A liter of cold blood cardioplegia was delivered in split doses between  the antegrade aortic and retrograde coronary sinus catheters.  There was  good cardioplegic arrest and septal temperature dropped less than 12  degrees.  Cardioplegia was then delivered every 20 minutes or less while  the crossclamp was in place.   First, the distal coronary anastomoses were performed.  Posterior  descending was chronically occluded and there was a 1.5-mm vessel.  Reverse saphenous vein was sewn end-to-side with running 7-0 Prolene  with good flow through the graft.  The second distal anastomosis was the  circumflex marginal.  This was a 1.5-mm vessel proximal left main 80-90%  stenosis.  Reverse saphenous vein was sewn end-to-side with running 7-0  Prolene with good flow through the graft.  The third distal anastomosis  was to the diagonal branch to LAD.  This was a 1.5-mm vessel, proximal  80% stenosis in its origin.  Reverse saphenous vein was small, caliber  was sewn end-to-side with running 7-0 Prolene with good flow through  graft.  Cardioplegia was redosed.  The fourth distal anastomosis was to  the distal LAD.  The LAD was intramyocardial and  diffusely calcified.  This was a 1.5-mm vessel.  The left IMA pedicle was brought through an  opening created in the left lateral pericardium, this was brought down  on the LAD and sewn end-to-side with running 8-0 Prolene.  There was  good flow through the anastomosis after briefly releasing the pedicle  bulldog on the mammary artery.  The bulldog was reapplied and the  pedicle was secured in the epicardium.  Cardioplegia was redosed.   While the crossclamp was still in place, an aortotomy was performed.  The aortic valve inspected.  It was trileaflet calcified and stenotic.  The abnormal valve was excised and annulus debrided of calcium.  The  outflow tract was irrigated with copious amounts of cold saline.  The  annulus was sized to a 21-mm [employer] valve.  Subannular 2-0  pledgeted Ethibond sutures were placed surrounding the annulus numbering  15 total.  The valve was prepared according to protocol and the sutures  were placed to the sewing ring.  The valve was seated and sutures were  tied.  There was good seating of the valve without space.  The coronary  ostia were unobstructed.  The aortotomy was then closed in 2 layers  using running 4-0 Prolene.  Cardioplegia was redosed.   Next, the proximal vein anastomoses were performed on the ascending  aorta.  The veins were anastomosed from the right coronary graft and the  circumflex graft to the ascending aorta with a running 7-0 Prolene and a  4.0-mm punch.  The vein graft to the diagonal was not placed on the  aorta due to a small caliber and this was placed end-to-side into the  proximal portion of the vein graft to the circumflex to make a  sequential type graft.  After the proximal anastomoses were completed,  air was vented from the coronaries and left heart with a dose of  retrograde warm blood cardioplegia in the usual de-airing maneuvers and  the crossclamp was removed.   The heart resumed spontaneous rhythm.  Air  was aspirated from the vein  grafts.  The Cardioplegia catheters were removed.  Hemostasis was  checked at the proximal and distal anastomosis.  The grafts had  excellent flow.  The patient was placed on renal dose dopamine and  pacing wires were applied.  When the patient had been adequately  rewarmed and reperfused, lungs were expanded, the ventilator was  resumed.  The patient was then weaned from bypass without difficulty.  Cardiac output and blood pressure were stable and cardiac function  looked good on the echo.  The aortic valve was functioning normally  without AI.  Protamine was administered without adverse reaction.  The  cannula was removed.  The mediastinum was irrigated with warm antibiotic  irrigation.  The leg incision was irrigated and closed in a standard  fashion.  The superior pericardial fat was closed over the aorta.  Two  mediastinal and left pleural chest tube were placed and brought out  through separate incisions.  The platelet count was borderline low and  there was continuous diffuse oozing, so the patient was given platelet  transfusion of 1 unit with improved coagulation function.  The sternum  was closed with interrupted steel wire.  The pectoralis fascia was  closed with a running #1 Vicryl and subcutaneous and skin layers were  closed in running Vicryl.  Total bypass time was 195 minutes, and the  patient returned to the SICU in stable condition.      Kerin Perna, M.D.  Electronically Signed     PV/MEDQ  D:  06/15/2008  T:  06/15/2008  Job:  [id number]   cc:   Thereasa Solo. Little, M.D.

## 2010-07-04 NOTE — Discharge Summary (Signed)
NAME:  [name], [name] NO.:  [id number]   MEDICAL RECORD NO.:  [id number]           PATIENT TYPE:   LOCATION:                                 FACILITY:   PHYSICIAN:  Monte Fantasia, MD  DATE OF BIRTH:  01-11-1922   DATE OF ADMISSION:  09/20/2008  DATE OF DISCHARGE:                               DISCHARGE SUMMARY   PRIMARY CARE PHYSICIAN:  Kerin Perna, MD   DISCHARGE DIAGNOSES:  1. Community-acquired pneumonia.  2. Hypoxia which is resolved.  3. Dyspnea which is resolved.  4. Mild chronic obstructive pulmonary disease exacerbation.  5. Coronary artery disease status post coronary artery bypass graft      and aortic valve replacement.  6. Diabetes.   MEDICATIONS UPON DISCHARGE:  1. Avelox 500 mg p.o. daily for two more days.  2. Prednisone 20 mg p.o. daily on September 22, 2008 and then 10 mg p.o.      daily on September 23, 2008 and then to discontinue.  3. DuoNeb 2 puffs inhalations q.6 h. p.r.n. shortness of breath.  4. Vitamin D 1000 international units daily.  5. Iron supplement Nu-Iron 150 mg p.o. daily.  6. Furosemide 40 mg p.o. daily.  7. Glipizide 2.5 mg p.o. daily.  8.,  Potassium chloride 20 mEq p.o. daily.  1. Carvedilol 6.25 mg p.o. b.i.d.  2. Aspirin 81 mg p.o. daily.  3. Lipitor 80 mg p.o. at bedtime.   COURSE DURING THE HOSPITAL STAY:  [name] is a pleasant 77 year old  Caucasian male patient was admitted on September 20, 2008 with complaints of  increasing shortness of breath and hypoxia.  The patient was brought to  the ER for evaluation and chest x-ray was negative.  CT angio done on  admission was negative for pulmonary embolus but there was parenchymal  opacity on the posterior right lower lobe possible atelectasis versus  early pneumonia, also nodular opacity of 10 mm at the right base was  concerning for bronchogenic carcinoma.  The patient was admitted to  telemetry bed and the patient was started on bronchodilators with oxygen  supplementation and vancomycin with Avelox.  The patient improved well  through the stay in the hospital.  Hypoxia improved.  At present the  patient is saturating 97% on room air.  Denies any complaints of  shortness of breath.  Blood cultures have been no growth to date and if  the blood cultures remain no growth for 5 days, the patient would be  medically stable to be discharged in next 24-48 hours.  The patient is  recommended to follow up with his primary care physician as an  outpatient, would need a workup for the pulmonary nodule as an  outpatient.   RADIOLOGICAL INVESTIGATIONS DONE DURING THE STAY IN THE HOSPITAL:  Chest  x-ray done on September 17, 2008 impression no acute disease.  CT angio done  on September 17, 2008 no evidence of acute pulmonary embolism.  Some  parenchymal opacity in the posterior right lower lobe may represent  atelectasis.  A 10- mm nodule which is noted to  be noncalcified in right  lung base.  Recommended followup CT.  Partial compressed T8 vertebral  body of uncertain age.   LABORATORY DATA DURING THE STAY IN HOSPITAL:  Total WBC is 10.5,  hemoglobin 11.9, hematocrit 34.6, platelets of 181.  Sodium 140,  potassium 4.1, chloride 105, bicarb 28, glucose 89, BUN 28, creatinine  1.34.  Calcium of 8.8.  Blood cultures x2 sets done on October 19, 2008  have been no growth to date and UA has been negative.   DISPOSITION:  The patient at present would be medically stable to be  discharged in next 24-48 hours.  Any change in the interim would be  dictated by the discharging physician.  The patient is awaited for  having the blood cultures for no growth for 5 days.  The patient would  be planned to be discharged on Avelox for two more days to complete the  course.  The CT angio showed a 10-mm pulmonary nodule. The patient would  need workup for the pulmonary nodule as an outpatient which was found on  the CT.  Would continue rest of his home medications.   TOTAL TIME  FOR DISCHARGE:  40 minutes with 15 minutes of counseling.      Monte Fantasia, MD  Electronically Signed     MP/MEDQ  D:  09/21/2008  T:  09/22/2008  Job:  [id number]   cc:   Kerin Perna, M.D.

## 2010-07-04 NOTE — Discharge Summary (Signed)
NAME:  [name], [name] NO.:  [id number]   MEDICAL RECORD NO.:  [id number]          PATIENT TYPE:  INP   LOCATION:  4739                         FACILITY:  [facility]   PHYSICIAN:  Monte Fantasia, MD  DATE OF BIRTH:  12-May-1921   DATE OF ADMISSION:  09/17/2008  DATE OF DISCHARGE:                               DISCHARGE SUMMARY   ADDENDUM   The patient needs to follow up with Dr. Christell Constant in regards to his workup  for pulmonary nodules measuring 10 mm in the right lower lobe as an  outpatient.  The fax number to Dr. Dorinda Hill Moore's office is [phone]  [phone].  The patient has a followup appointment on September 28, 2008, at 9  a.m.  The patient has been informed regarding his pulmonary nodule and  that he needs to follow up with Dr. Christell Constant as an outpatient with his next  appointment.      Monte Fantasia, MD  Electronically Signed     MP/MEDQ  D:  09/21/2008  T:  09/22/2008  Job:  [id number]   cc:   Ernestina Penna, M.D.

## 2010-07-04 NOTE — Assessment & Plan Note (Signed)
OFFICE VISIT   [name], [name]  DOB:  12/19/1921                                        Jul 16, 2008  CHART #:  [id number]   CURRENT PROBLEMS:  1. Status post aortic valve replacement, coronary artery bypass graft      x4 June 14, 2008, using a bioprosthetic valve.  2. Chronic obstructive pulmonary disease requiring home oxygen therapy      following discharge, now improved.  3. Ankle swelling at the right leg from the saphenous vein harvest.   PRESENT ILLNESS:  The patient is a very nice 77 year old gentleman, who  returns for his final postop office visit after undergoing AVR, CABG x4  approximately a month ago for critical aortic stenosis and multivessel  coronary artery disease.  He has done well at home and denies any  symptoms of recurrent CHF or angina.  His oxygen levels have improved  and he is being followed by the home health nurses and now saturation  off oxygen is between 90 and 95.  He has been walking and is scheduled  to begin outpatient cardiac rehab phase II next week.  Surgical  incisions are healing well.  There has been some question whether he  should be on Coreg and the patient was discharged home on 12.5 mg per  day, but this was discontinued according to a home health nurse  instruction.   His current medications at this time after a transient course of oral  diuretics was completed include aspirin 81 mg, vitamin D and B, Lipitor  80 mg daily, glipizide 5 mg daily, iron 1 tablet daily, and Ultram  p.r.n. pain.   PHYSICAL EXAMINATION:  VITAL SIGNS:  Blood pressure 150/70, pulse 78,  respirations 18, and saturation 95% on oxygen, 94% on room air.  LUNGS:  Breath sounds are clear and equal.  CARDIAC:  Rhythm is regular.  There is no murmur.  CHEST:  Sternal incision is well healed.  EXTREMITIES:  The leg incision is well healed and the ankle edema has  fairly resolved.   PA and lateral chest x-ray shows clear lung fields.  No  evidence of  significant pleural effusion and the sternal wires were all intact.   RECOMMENDATIONS:  The patient will continue his rehab program at home  and start the outpatient hospital based program next week.  I did  recommend that he go back on Coreg at a lower dose 6.25 mg once a day to  help prevent recurrent atrial fibrillation and to help control his blood  pressure.  His home health nurse will be coming after the Monday holiday  for vital signs check, and the patient and his daughter know to discuss  the heart rate and blood pressure with the nurse that time to see if  this dose is appropriate.  Otherwise, the patient has shown a good early  recovery from surgery.  He will return here as needed.   Kerin Perna, M.D.  Electronically Signed   PV/MEDQ  D:  07/16/2008  T:  07/17/2008  Job:  [id number]   cc:   Thereasa Solo. Little, M.D.  [facility]

## 2010-07-04 NOTE — Consult Note (Signed)
NAME:  [name], [name] NO.:  [id number]   MEDICAL RECORD NO.:  [id number]          PATIENT TYPE:  INP   LOCATION:  2925                         FACILITY:  [facility]   PHYSICIAN:  Kerin Perna, M.D.  DATE OF BIRTH:  04/15/1921   DATE OF CONSULTATION:  06/10/2008  DATE OF DISCHARGE:                                 CONSULTATION   PHYSICIAN REQUESTING CONSULTATION:  1. Nicki Guadalajara, MD  2. Thereasa Solo. Little, MD   PRIMARY CARE PHYSICIAN:  Ernestina Penna, MD, [facility].   REASON FOR CONSULTATION:  Aortic stenosis and three-vessel coronary  artery disease with left main disease.   CHIEF COMPLAINT:  Shortness of breath with exertion.   HISTORY OF PRESENT ILLNESS:  I was asked to evaluate this 77 year old  Caucasian diabetic male for possible multivessel coronary artery bypass  grafting and aortic valve replacement for recently diagnosed aortic  stenosis (moderate-to-severe), and left main disease with three-vessel  CAD and EF of 30%.  The patient has been followed for a cardiac murmur  with serial 2-D echoes.  Recently, the transvalvular aortic gradient was  measured at 28 mmHg and a calculated area of 0.9-1.0.  He had minimal  symptoms at that point.  More recently, he has developed exertional  dyspnea and decreasing exercise tolerance.  For that reason, a stress  test was performed which was significantly positive for ischemia in the  inferolateral and anterobasal regions.  His EF on the radionucleotide  scan was 29%.  He is brought in today for elective cath by Dr. Tresa Endo.  This demonstrated 80-90% left main stenosis with total occlusion of the  right coronary and subtotal disease of the left coronary circumflex and  LAD.  His EF was 30%.  He had a right heart cath, which demonstrated a  transvalvular gradient of 24 mmHg across the aortic valve with a  calculated area of 0.9.  His PA pressures are 40/20 and his cardiac  output was 4.5 liters per minute.   Left ventricular end-diastolic  pressure was 14 mmHg.  Mixed venous saturation was 75% and an aortogram  showed no significant descending aneurysm or AI.  Based on his coronary  anatomy, an aortic stenosis he is felt to be a potential candidate for  aortic valve replacement and multivessel surgical coronary  revascularization.   PAST MEDICAL HISTORY:  1. Status post left carotid endarterectomy in 2003 by Dr. Hart Rochester with      recent carotid Doppler showing 40-60% disease in both carotids.  2. Non-insulin-dependent diabetes mellitus.  3. Hypertension.  4. Dyslipidemia.  5. Mild COPD from prior history of smoking.  6. Hypertension.   ALLERGIES:  None.   HOME MEDICATIONS:  1. Lotrel 10/20.  2. Lipitor 80 mg.  3. Glucotrol XL 5 mg 1/2 tablet daily.  4. HCTZ 25 mg daily.  5. Aspirin 81 mg daily.   SOCIAL HISTORY:  The patient stopped smoking in 1980.  He is widowed and  has 2 two daughters.  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  He denies alcohol intake.  He is  a retired Psychologist, forensic.   REVIEW OF SYSTEMS:  No recent weight loss or fever or hospitalizations.  No history of significant trauma to his thorax or abdomen.  He does have  mild claudication, right greater than left leg after walking half a  mile.  No history of DVT or varicose veins.  The patient has a past  history of peptic ulcer disease, but no recent blood per rectum or  abdominal pain.  No history of symptoms of BPH or UTI or kidney stones.  He has overall felt fairly well except for his exertional symptoms.  He  has upper dental plates and has no active dental complaints or  difficulty swallowing.  He was admitted briefly in 2008 for an episode  of double vision, however, his brain MRI was negative and his carotid  studies were negative and has had no recurrence of the symptoms.   PHYSICAL EXAMINATION:  VITAL SIGNS:  The patient is 5 feet 8 inches,   weighs 167 pounds, blood pressure is 110/60, pulse 90 and regular,  saturation on room air is 93%.  GENERAL APPEARANCE:  A pleasant elderly male in the Cath Lab Holding  Area following cardiac catheterization, no acute distress.  HEENT:  Normocephalic.  He has slight anisocoria, but pupils react.  Dentition is good.  NECK:  Without JVD, mass, or bruit.  He has a well-healed left neck  carotid incision.  LYMPHATICS:  No palpable supraclavicular adenopathy.  LUNGS:  Breath sounds are clear and equal.  CARDIAC:  A high pitched 3/6 systolic ejection murmur at the right upper  sternal border.  There is no diastolic murmur.  ABDOMEN:  Soft and nontender without pulsatile mass.  EXTREMITIES:  No clubbing, cyanosis, or edema.  Peripheral pulses are  intact in the radial and femoral areas and trace in the pedal area.  NEUROLOGIC:  Nonfocal.  He is alert and oriented with mild hearing loss.   LABORATORY DATA:  I reviewed his cardiac cath and his most recent echo.  He has severe three-vessel coronary artery disease, left main disease,  reduced LV function, and moderate-to-severe aortic stenosis with class  III symptoms.   PLAN:  The patient will be admitted to the hospital and prepared for  aortic valve replacement with a bioprosthetic valve and multivessel  bypass grafting.   Thank you for the consultation.      Kerin Perna, M.D.  Electronically Signed     PV/MEDQ  D:  06/10/2008  T:  06/11/2008  Job:  [id number]   cc:   [facility]  [facility]

## 2010-07-07 NOTE — H&P (Signed)
NAME:  [name] NO.:  [id number]   MEDICAL RECORD NO.:  [id number]          PATIENT TYPE:  INP   LOCATION:  3013                         FACILITY:  [facility]   PHYSICIAN:  Ladell Pier, M.D.   DATE OF BIRTH:  December 24, 1921   DATE OF ADMISSION:  04/28/2006  DATE OF DISCHARGE:                              HISTORY & PHYSICAL   CHIEF COMPLAINT:  Double vision.   HISTORY OF PRESENT ILLNESS:  The patient is an 77 year old white male  with a past medical history significant for diabetes, hypertension,  dyslipidemia, status post carotid endarterectomy on the left.  The  patient was brought to the emergency department by his daughters with  chief complaint of double vision when he woke up this morning.  It  lasted for approximately 2.5 hours.  The double vision has resolved but  he still complains of some blurriness in his vision.  He also complains  of intermittent pain  in the right shoulder for the past two weeks for  which he saw his primary care physician and was diagnosed with  arthritis.  He had no chest pain, no shortness of breath, no weakness,  no drooling, no slurred speech, no loss of vision.  He recently went to  the ophthalmologist approximately 3-4 weeks ago and received new  glasses.   PAST MEDICAL HISTORY:  Significant for cataract surgery in the left eye  July of 2007, hypertension, diet-controlled diabetes, dyslipidemia,  status post appendectomy, status post carpal tunnel surgery, status post  left carotid endarterectomy Dr. Hart Rochester.   FAMILY HISTORY:  Father died at 53 from heart failure.  Brother died at  42 from cancer.  Sister died from complications of diabetes.   SOCIAL HISTORY:  He is widowed.  He has two daughters.  He quit tobacco  back in 1980.  No alcohol use.  He is a retired Curator.   MEDICATIONS:  Lipitor every evening, aspirin 81 mg daily, HCTZ 25 mg  daily, another blood pressure medication not sure of the name.   ALLERGIES:   No known drug allergies.   REVIEW OF SYSTEMS:  See HPI.   PHYSICAL EXAMINATION:  VITAL SIGNS:  Temperature 97.5, blood pressure  158/77, pulse 82, respirations 20, pulse ox 93% on room air.  HEENT:  Head is normocephalic, atraumatic.  Pupils are equal, round and  reactive to light.  Opaque lens in the left eye.  CARDIOVASCULAR:  Regular rate and rhythm, 2/6 systolic murmur radiating  to the right carotid.  ABDOMEN:  Positive bowel sounds.  LUNGS: Clear bilaterally.  EXTREMITIES:  No edema.  NEURO: Nonfocal.   LABORATORY DATA:  Head CT negative.  MRI no acute disease.  Chest x-ray  shows COPD no active disease.  Sodium 142, potassium 3.9, chloride 107,  CO2 29, BUN 16, creatinine 1.0, glucose 140, D-dimer 0.41.  Cardiac  enzymes negative.   ASSESSMENT:  1. Double vision question of transient ischemic attack.  The patient      had MRI and CAT scan that was all negative.  He has a history of  carotid artery disease.  Will get Dopplers of the carotid artery      disease.  Will get Dopplers of the carotids.  He stated that he      recently followed up with Dr. Hart Rochester in November and his carotid      Doppler was fine.  Will also get a 2-D echo.  He normally takes      aspirin 81 mg daily, will increase him to 325 mg daily and add      Plavix.  Will do a neurology consult in the morning.   1. Diabetes, will put him on sliding scale insulin.  He does not take      any medications for his diabetes.  It is diet controlled.   1. Hypertension, will continue HCTZ.  His family will call or bring in      the name of his other blood pressure medications.   1. Dyslipidemia, will check fasting lipid panel and continue Lipitor.      Heart murmur.  Will get a 2-D echo, he states that he has had this      for a long time.   1. Mild hypoxia.  His D-dimer is normal.  His chest x-ray shows some      mild chronic obstructive pulmonary disease.  This is probably the      etiology of his mild  decrease in oxygen level.  Will check his      oxygen level again on the floor.  He has known shortness of breath.      Ladell Pier, M.D.  Electronically Signed     NJ/MEDQ  D:  04/28/2006  T:  04/29/2006  Job:  [id number]   cc:   Ernestina Penna, M.D.

## 2010-07-07 NOTE — Consult Note (Signed)
NAME:  [name], [name] NO.:  [id number]   MEDICAL RECORD NO.:  [id number]          PATIENT TYPE:  INP   LOCATION:  3013                         FACILITY:  [facility]   PHYSICIAN:  Marlan Palau, M.D.  DATE OF BIRTH:  1921/11/25   DATE OF CONSULTATION:  DATE OF DISCHARGE:                                 CONSULTATION   HISTORY OF PRESENT ILLNESS:  [name] is an 77 year old right-handed  white male born [birth year] with a history of cerebrovascular disease,  diabetes, hypertension.  This patient claims that, on the day of  admission, he wakened with normal vision.  Shortly thereafter, he looked  out the window and noted double vision.  The patient states multiple  times that he could close one eye or the other and still saw two objects  with either eye.  The patient noted that over 2 hours or so, he seemed  to improve with his vision but now has some slight blurriness of vision,  that again does not improve when he covers one eye or the other.  The  patient claims the double vision was horizontal in nature and had  nothing to do with the direction of gaze or distance.  The patient  reported no other symptoms such as headache, eye pain, problems with  neck discomfort.  The patient did not denies any numbness or weakness on  the face, arms or legs, balance problems vertigo, dizziness, nausea,  vomiting, slurred speech, trouble swallowing.  This patient has been  brought into the hospital, has had an MRI scan of the brain that is  unremarkable.  The patient has undergone a carotid Doppler study that  shows 60-80% stenosis of the right internal carotid artery, 40-60%  stenosis of the left internal carotid artery.  A 2-D echocardiogram has  been done, but with the results are pending.  The patient is on Plavix.  Neurology has been asked to see this patient for further evaluation.   PAST MEDICAL HISTORY:  Significant for:  1. New onset of double vision that is  non-physiologic in nature.  The      patient reports bilateral monocular horizontal double vision.  2. Diabetes.  3. Hypertension.  4. Dyslipidemia.  5. Prior left carotid enterectomy.  6. Left cataract surgery.  7. Appendectomy.  8. Left carpal tunnel syndrome surgery.  9. Degenerative arthritis of the right shoulder.   SOCIAL HISTORY:  The patient currently does not smoke or drink.   ALLERGIES:  HAS NO KNOWN ALLERGIES.   CURRENT MEDICATIONS:  Include:  1. Plavix 75 mg daily.  2. Lovenox 40 mg subcu daily.  3. Hydrochlorothiazide 25 mg a day.  4. Sliding scale insulin.  5. Protonix 40 mg daily.  6. Zocor 40 mg daily.  7. Tylenol if needed.   SOCIAL HISTORY:  The patient is a widower, lives in the [place] area.  The patient has two daughters who are alive and well, with the exception  that one has hypertension.   FAMILY MEDICAL HISTORY:  Mother died with stroke.  Father died with an  MI.  The patient had 12 brothers and sisters, all of which have passed  away, some with cancer, diabetes, heart disease, senile dementia,  __________.  One died from motor vehicle accident.   REVIEW OF SYSTEMS:  Note for no recent fevers, chills.  The patient has  reported no problems with breathing, chest pains, abdominal pain,  nausea, vomiting, trouble with bowels or bladder, dizziness, blackout  episodes.  Once again, the patient denies numbness or weakness on the  face, arms or legs.   PHYSICAL EXAMINATION:  VITALS:  Blood pressure 121/65, heart rate 72,  respiratory rate 20, temperature afebrile.  GENERAL:  The patient is a fairly, well-developed elderly white male who  is alert, cooperative at the time examination.  HEENT:  Head is atraumatic.  Eyes:  Pupils equal, round and react to  light.  Disks are flat bilaterally.  NECK:  Supple.  No carotid bruits noted on the right.  Left carotid  bruit is noted.  RESPIRATORY:  Examination is clear.  CARDIOVASCULAR:  Patient with a  regular rate and rhythm.  No obvious  murmurs, rubs noted.  EXTREMITIES:  Are without significant edema.  NEUROLOGIC:  Cranial nerves as above.  Facial symmetry is present.  The  patient has good sensation of face to pinprick, soft touch bilaterally.  Has good strength of facial muscle, muscles of the head, turning,  shoulder shrug bilaterally.  Speech is well enunciated, not aphasic.  Motor testing shows 5/5 strength in all fours.  Good symmetric motor  tone is noted throughout.  Sensory testing is intact to pinprick, soft  touch, vibratory sensation, position sense intact on all fours.  The  patient has fair finger-nose-finger, heel-to-shin.  Gait normal.  Tandem  gait slightly unsteady.  Romberg negative.  No evidence of pronator  drift is seen.  Deep tendon reflexes depressed but symmetric.  Toes  neutral bilaterally.   LABORATORY VALUES:  Notable for a sodium of 141, potassium 3.7, chloride  of 105, CO2 25, glucose of 207, BUN of 14, creatinine 1.04, total bili  of 0.8, alk phosphatase of 41, SGOT of 41, SGPT 24, total protein of  6.3, calcium of 9.3, CK of 41, MB fraction 1.6.  Cholesterol is 133,  triglycerides of 121, HDL of 41, LDL of 68, VLDL 24.  TSH is 0.319.  Urinalysis reveals specific gravity of 0.021, pH of 8, otherwise  unremarkable.  Hemoglobin of 15.3, hematocrit of 45.   IMPRESSION:  1. Monocular double vision bilaterally, etiology unclear.  2. Diabetes.  3. Hypertension.  4. Left carotid enterectomy.   This patient clearly has multiple risk factors for small and large  vessel disease.  The patient reports a sudden onset of double vision in  the horizontal plane, may be at risk for diabetic sixth nerve palsy or  potentially for small brainstem stroke.  This patient, however, reports  that he has a horizontal double vision with each eye independently,  which is not consistent with a neurologic cause of double vision. Certainly monocular causes of double vision  can occur, including a  lenses, dislocation etc.  It is unlikely, however, that a monocular  cause of double vision would come on in both eyes at the same time.  Stroke workup so far is negative.  Patient's thyroid study shows mild  lowering of the TSH.  The patient may need to further workup for  hyperthyroidism.   PLAN:  1. Will check an MRI angiogram of the intracranial vessels.  2. Check blood work for B12 level, RPR, sed rate, thyroid hormones,      ANA rheumatoid factor, ACE level (angiotensin-converting enzyme      level). Acetylcholine receptor antibody panel including binding,      blocking and modulating antibodies.  Will follow the patient's      clinical course while in-house.      Marlan Palau, M.D.  Electronically Signed     CKW/MEDQ  D:  04/29/2006  T:  04/29/2006  Job:  [id number]   cc:   Ladell Pier, M.D.  [facility]

## 2010-07-07 NOTE — Discharge Summary (Signed)
NAME:  [name], [name] NO.:  [id number]   MEDICAL RECORD NO.:  [id number]          PATIENT TYPE:  INP   LOCATION:  3013                         FACILITY:  [facility]   PHYSICIAN:  Altha Harm, MDDATE OF BIRTH:  19-Aug-1921   DATE OF ADMISSION:  04/28/2006  DATE OF DISCHARGE:  04/30/2006                               DISCHARGE SUMMARY   DISCHARGE DISPOSITION:  Home.   FINAL DIAGNOSES:  1. Double vision possible transient ischemic attack.  2. Diabetes type 2 new diagnosis.  3. Hyperlipidemia.  4. Hypertension.   DISCHARGE MEDICATIONS:  New Medication:  1. Glucotrol 5 mg p.o. daily.  Continued Medications:  1. Lipitor 40 mg p.o. h.s.  2. Hydrochlorothiazide 25 mg p.o. daily.  3. Lotrel 10/20 one tablet p.o. daily.  Adjusted Medications:  1. Aspirin 325 mg daily up from 81 mg p.o. daily.   CONSULTANTS:  Marlan Palau, M.D.   DIAGNOSTIC STUDIES:  MRI/MRA of the brain which showed no major vessel  occlusion.  Impression:  Apparent areas of irregularity in the carotid  siphon  region that could represent atherosclerotic change to be  artifactual.  No flow-limited stenosis in any Reza.  Impression:  Stenosis or artificial appearance at the A1 segment of the right.  The  other intracranial vessels all appear unremarkable.   STUDIES:  A portable chest x-ray which shows progression of chronic  interstitial lung disease and COPD.   PENDING STUDIES:  A 2-D echocardiogram was performed.  The results are  still not available.   LABORATORY STUDIES OF SIGNIFICANCE:  Hemoglobin A1c 6.6.  At the day of  discharge, the patient had a white blood cell count of 6.6, hemoglobin  of 13.8, hematocrit 39.9, and a platelet count of 243.  An erythrocyte  sedimentation rate was marginally elevated at 17.  He had a sodium of  140, potassium of 3.6, chloride 106, bicarbonate 26, glucose initially a  fasting glucose of 207.  On the day of discharge, glucose was 106 after  Glucotrol administered, a BUN of 18 and creatinine of 1.12 with an  estimated GFR of greater than 60 and a calcium of 6.6.  Cardiac enzymes  were performed.  The patient had serial cardiac enzymes which were all  within the range of a CK of 41 to 52.  CK-MB of 1.3 to 1.6 progressively  declining, and a troponin of 0.03.  Lipid studies performed showed a  total cholesterol of 133, HDL of 41, triglycerides of 121, LDL of 68,  VLDL of 24.  TSH was on the marginal side of low at 3.19, a free T4 was  normal at 1.33, and a T3 is still pending and needs to be followed upon.  Other laboratory studies included an RPR which was negative.   Laboratory studies to be followed up on include the following:  A B12,  folate, homocysteine, ANA.  An ACE level and an acetylcholine-binding  antibody are to be followed upon by primary care physician.   PRIMARY CARE PHYSICIAN:  Dr. Christell Constant at [facility].   CODE STATUS:  Full Code.   ALLERGIES:  No known drug allergies.   CHIEF COMPLAINT:  Double vision.   HISTORY OF PRESENT ILLNESS:  This is an 77 year old white male with a  past history of hyperlipidemia and hypertension and diet-controlled  diabetes who is status carotid endarterectomy on the left.  The patient  was brought to the emergency room by his daughters with a chief  complaint of double vision when he awoke.  Please see the history and  physical dictated by Dr. Olena Leatherwood for complete details of the history of  present illness.   HOSPITAL COURSE:  1. Double vision.  By the time the patient had arrived to the      emergency room the double vision had essentially began to resolve      and had complete resolution within the first 24 hours of him being      here.  The patient experienced no further symptoms, and CVA workup      was undertaken with results as noted above.  The patient had no      other focal abnormalities from a neurological or physical      standpoint, and at the  time of discharge the patient was ambulating      without any difficulty on leveling stairs.  The patient also had no      further double vision and no complaints of dizziness, syncope, loss      of consciousness,  or headaches.  The patient did also complain of      some blurring of vision which he states has been present since he      had his lenses changed on his prescription, and I attribute this to      adjustment period of the new prescription of lenses.  2. Diabetes type 2.  The patient has been a diet-controlled diabetes.      However, upon arrival to the emergency room, a fasting blood      glucose was greater than 200 at a level of 207.  Based upon this      information and the fact that his hemoglobin A1C was marginally      elevated at 6.6, the patient was started on Glucotrol 5 mg p.o.      daily and should be followed up with his primary care physician for      further titration either up or down.  The patient has been      instructed in signs and symptoms of hypoglycemia and has been      instructed to take measures that should be taken should he develop      hypoglycemia associated with his medication.  The patient also      received diabetic education prior to leaving in terms of diet and      carbohydrates exchanges.  3. Hypertension.  The patient's blood pressure remained normotensive      during his hospital stay, and he had no difficulty involving this;      and as noted, the patient's cholesterol was monitored while      hospitalized and appears to be well controlled on his usual      medications.   The patient's condition on discharge is stable.   Today, he is afebrile.  Blood pressure is 117/70, heart rate 81,  temperature 98, O2 saturation is 95% on room air, and respiratory rate  is 15.  LUNGS:  Clear to auscultation.  CARDIOVASCULAR:  Normal S1 and S2, no  murmurs, rubs, or gallops are  noted. NEUROLOGIC:  The patient's Cranial Nerves II-XII are grossly  intact, and  he has no focal neurological deficits.   DIETARY RESTRICTIONS:  The patient should be on a carbohydrate-  controlled, low-cholesterol, low-salt diet.   PHYSICAL RESTRICTIONS:  None.   FOLLOWUP:  The patient to follow up with his primary care physician with  an appointment already scheduled in the next few weeks.  I do not see  any urgency for an earlier appointment to be scheduled.  The patient has  been given a prescription for a Glucometer and has been instructed to  check his blood sugars on an a.c. and h.s. basis and report them to his  primary care physician.      Altha Harm, MD  Electronically Signed     MAM/MEDQ  D:  04/30/2006  T:  04/30/2006  Job:  [id number]   cc:   Dr. Christell Constant

## 2010-07-07 NOTE — H&P (Signed)
[place]  Patient:    Byrnes, [facility] Visit Number: [id number] MRN: [id number]          Service Type: SUR Location: 3300 [id number] Attending Physician:  Colvin Caroli Dictated by:   Adair Patter, P.A. Admit Date:  03/17/2001 Discharge Date: 03/18/2001                           History and Physical  CHIEF COMPLAINT:  Carotid artery disease.  HISTORY OF PRESENT ILLNESS:  This is a 77 year old white male who was referred to Dr. Quita Skye. Hart Rochester by Dr. Christell Constant for evaluation of carotid artery disease. Patient states on February 18, 2001, he had an episode of which he lost fine motor control of his right arm.  Because of this, he was seen in the emergency room where he had a CAT scan and carotid Doppler study done which revealed that he had significant left internal carotid artery stenosis.  Patient denies any headaches, nausea, vomiting or vertigo.  He states he has occasional dizziness.  He denies any history of falls, seizures, numbness or tingling in his extremities.  He denies any muscle weakness, speech impairment, dysphagia or visual changes.  He denies any syncope, presyncope, memory loss or confusion.  PAST MEDICAL HISTORY: 1. Hypercholesterolemia. 2. Hypertension.  PAST SURGICAL HISTORY: 1. Left carpal tunnel release. 2. Appendectomy.  ALLERGIES:  Patient denies any known medication allergies.  MEDICATIONS: 1. Lipitor 10 mg p.o. q.d. 2. Diovan one p.o. q.d. 3. Hydrochlorothiazide 12.5 mg p.o. q.d. 4. Vitamin B 6 100 mg p.o. q.d. 5. Baby aspirin one p.o. q.d. 6. Plavix 75 mg p.o. q.d., which was stopped on March 11, 2001.  FAMILY HISTORY:  Patient says his father died at age 75 of congestive heart failure.  He has a brother who died of cancer at age 57 and a sister who has a history of significant for diabetes mellitus.  REVIEW OF SYSTEMS:  GENERAL:  Patient denies any fever, chills, night sweats or frequent illnesses.  HEAD:   Patient denies any headaches, migraines, dizziness or seizures.  EYES:  Patient wears corrective lenses for his vision. Denies any glaucoma or cataracts.  EARS:  Patient denies any tinnitus, vertigo, hearing loss or ear infections.  NOSE:  Patient denies any epistaxis but does have occasional sinusitis.  MOUTH:  Patient denies any problems with dentition or frequent sore throats.  NECK:  Patient denies any lumps, masses or tenderness with range of motion in his neck.  CARDIOVASCULAR:  Patient has hypertension which he takes medication for.  He denies any history of angina, myocardial infarction or cardiac arrhythmias.  PULMONARY:  Patient denies any asthma, bronchitis or emphysema.  GI:  Patient denies any nausea, vomiting, diarrhea, constipation, hematochezia or melena.  GU:  Patient denies any evidence of UTIs or incontinence.  MUSCULOSKELETAL:  Patient had carpal tunnel syndrome for which he had a carpal tunnel release on his left hand.  He denies any arthritis or arthralgias or myalgias.  NEUROLOGIC:  Patient has a history of significant for TIA as stated in the history of present illness.  He denies any CVA, memory loss or depression.  PHYSICAL EXAMINATION:  VITAL SIGNS:  Blood pressure is 140/70 in right arm, 148/80 in the left arm; pulse is 68 and regular; respirations 16.  GENERAL:  Patient is alert and oriented x3.  He is in no distress.  HEENT:  Atraumatic, normocephalic.  Eyes:  Pupils equal, round and reactive to light and accommodation.  Extraocular muscles are intact.  No scleral icterus or nystagmus.  Ears:  Auditory acuity is grossly intact.  Nose:  Nasal patency is intact.  Sinuses are nontender.  Mouth is moist without exudates.  NECK:  Supple without JVD, lymphadenopathy, carotid bruits or thyromegaly.  CARDIOVASCULAR:  Regular rate and rhythm, without murmurs, gallops or rubs.  LUNGS:  Bilaterally clear to auscultation without rales, rhonchi or  wheezes.  ABDOMEN:  Soft, nontender, nondistended.  Positive bowel sounds in all four quadrants.  EXTREMITIES:  No cyanosis, clubbing or edema.  His feet were warm to palpation.  PERIPHERAL VASCULAR:  He has 2+ carotid and femoral pulses bilaterally.  NEUROLOGIC:  Patient had cranial nerves II-XII grossly intact.  He was able to move all four extremities.  He had decreased grip strength in his left hand which he states is secondary to his carpal tunnel.  He had 5+ grip strength in his right hand.  He had gait that was steady and unaided with assistive devices.  IMPRESSION:  Left internal carotid artery stenosis.  PLAN:  Patient will undergo a left carotid endarterectomy by Dr. Josephina Gip on March 17, 2001 at [facility]. Dictated by:   Adair Patter, P.A. Attending Physician:  Colvin Caroli DD:  03/13/01 TD:  03/13/01 Job: [id number]

## 2010-07-07 NOTE — H&P (Signed)
[place]  Patient:    Sporer, [facility] Visit Number: [id number] MRN: [id number]          Service Type: SUR Location: 3300 [id number] [id number] Attending Physician:  Colvin Caroli Dictated by:   Quita Skye Hart Rochester, M.D. Admit Date:  03/17/2001 Discharge Date: 03/18/2001                           History and Physical  PREOPERATIVE DIAGNOSIS:  Left carotid occlusive disease with left hemispheric transient ischemic attack.  POSTOPERATIVE DIAGNOSIS:  Left carotid occlusive disease with left hemispheric transient ischemic attack.  OPERATION:  Left carotid endarterectomy with Dacron patch angioplasty.  SURGEON:  Quita Skye. Hart Rochester, M.D.  FIRST ASSISTANT:  Di Kindle. Edilia Bo, M.D.  SECOND ASSISTANT:  Loura Pardon, P.A.  ANESTHESIA:  General endotracheal.  BRIEF HISTORY:  This patient suffered a left hemispheric TIA about four or five weeks ago consisting of right upper extremity clumsiness which lasted approximately five minutes.  This resolved and he has had no recurrence of symptoms.  He was found on carotid duplex scanning to have a moderately severe left internal carotid stenosis with an irregular plaque and he was scheduled for a left carotid endarterectomy.  PROCEDURE:  The patient was taken to the operating room, placed in the supine position, at which time satisfactory general endotracheal anesthesia was administered.  The left neck was prepped with Betadine scrub and solution, draped in a routine sterile manner.  Incision was made along the anterior border of the sternocleidomastoid muscle and carried down through subcutaneous tissue and platysma using the Bovie.  The common facial vein and external jugular veins were ligated with 3-0 silk ties and divided, exposing the common, internal, and external carotid arteries.  Care was taken not to injure the vagus or hypoglossal nerves, both of which were exposed.  There was a calcified atherosclerotic  plaque at the carotid bifurcation extending up the internal carotid artery about 3-4 cm; distal vessel appeared normal.  A #10 shunt was prepared and the patient was heparinized.  The carotid vessels were occluded with vascular clamps, a longitudinal opening made in the common carotid with a 15 blade, extended up the internal carotid with the Pott scissors to a point distal to the disease.  A standard endarterectomy was then performed after inserting a #10 shunt without difficulty.  Flow was reestablished in about two minutes.  The plaque itself was very ulcerative in nature posteriorly with an ulcer crater measuring about 8 mm in diameter with some irregular debris.  A standard endarterectomy was then performed using an elevator and the Pott scissors with an eversion endarterectomy of the external carotid.  The plaque feathered off the distal internal carotid artery nicely, not requiring any tacking sutures.  The lumen was thoroughly irrigated with heparin saline and all loose debris carefully removed, and the arteriotomy was closed with the patch using continuous 6-0 Prolene.  Prior to completion of the closure, the shunt was removed after about 30 minutes of shunt time. Following antegrade and retrograde flushing the closure was completed, with reestablishment of flow initially up the external and up the internal branch. The carotid was occluded for less than two minutes for removal of the shunt. Protamine was then given to reverse the heparin.  Following adequate hemostasis the wound was irrigated with saline, closed in layers with Vicryl in a subcuticular fashion, sterile dressing applied, the patient taken to the recovery room in  satisfactory condition. Dictated by:   Quita Skye Hart [place], M.D. Attending Physician:  Colvin Caroli DD:  03/17/01 TD:  03/17/01 Job: [id number]

## 2011-09-24 DIAGNOSIS — I6529 Occlusion and stenosis of unspecified carotid artery: Secondary | ICD-10-CM

## 2011-09-24 HISTORY — DX: Occlusion and stenosis of unspecified carotid artery: I65.29

## 2012-06-17 ENCOUNTER — Telehealth: Payer: Self-pay | Admitting: Family Medicine

## 2012-06-17 MED ORDER — CLOPIDOGREL BISULFATE 75 MG PO TABS: 75.0000 mg | ORAL_TABLET | Freq: Every day | ORAL | Status: DC

## 2012-06-17 NOTE — Telephone Encounter (Signed)
Rx Refilled  

## 2012-07-04 ENCOUNTER — Other Ambulatory Visit: Payer: Self-pay | Admitting: Family Medicine

## 2012-07-04 MED ORDER — BENAZEPRIL HCL 10 MG PO TABS: 10.0000 mg | ORAL_TABLET | Freq: Every day | ORAL | Status: DC

## 2012-07-04 NOTE — Telephone Encounter (Signed)
Medication refilled per protocol. 

## 2012-07-08 ENCOUNTER — Other Ambulatory Visit: Payer: Self-pay | Admitting: *Deleted

## 2012-07-08 MED ORDER — CARVEDILOL 12.5 MG PO TABS: 12.5000 mg | ORAL_TABLET | Freq: Two times a day (BID) | ORAL | Status: DC

## 2012-07-10 ENCOUNTER — Other Ambulatory Visit: Payer: Self-pay | Admitting: *Deleted

## 2012-07-10 MED ORDER — CARVEDILOL 12.5 MG PO TABS: 12.5000 mg | ORAL_TABLET | Freq: Two times a day (BID) | ORAL | Status: DC

## 2012-07-10 NOTE — Telephone Encounter (Signed)
Sent meds.

## 2012-07-19 ENCOUNTER — Encounter: Payer: Self-pay | Admitting: *Deleted

## 2012-07-21 ENCOUNTER — Encounter: Payer: Self-pay | Admitting: Cardiovascular Disease

## 2012-07-21 ENCOUNTER — Ambulatory Visit (INDEPENDENT_AMBULATORY_CARE_PROVIDER_SITE_OTHER): Payer: Medicare Other | Admitting: Cardiovascular Disease

## 2012-07-21 VITALS — BP 162/74 | HR 65 | Ht 66.0 in | Wt 147.0 lb

## 2012-07-21 DIAGNOSIS — Z8679 Personal history of other diseases of the circulatory system: Secondary | ICD-10-CM

## 2012-07-21 DIAGNOSIS — I2510 Atherosclerotic heart disease of native coronary artery without angina pectoris: Secondary | ICD-10-CM

## 2012-07-21 DIAGNOSIS — I10 Essential (primary) hypertension: Secondary | ICD-10-CM

## 2012-07-21 DIAGNOSIS — I119 Hypertensive heart disease without heart failure: Secondary | ICD-10-CM

## 2012-07-21 DIAGNOSIS — Z9889 Other specified postprocedural states: Secondary | ICD-10-CM

## 2012-07-21 DIAGNOSIS — E785 Hyperlipidemia, unspecified: Secondary | ICD-10-CM

## 2012-07-21 DIAGNOSIS — E119 Type 2 diabetes mellitus without complications: Secondary | ICD-10-CM

## 2012-07-21 DIAGNOSIS — I25810 Atherosclerosis of coronary artery bypass graft(s) without angina pectoris: Secondary | ICD-10-CM

## 2012-07-21 MED ORDER — FUROSEMIDE 40 MG PO TABS: 40.0000 mg | ORAL_TABLET | ORAL | Status: DC

## 2012-07-21 MED ORDER — CARVEDILOL 12.5 MG PO TABS: 12.5000 mg | ORAL_TABLET | Freq: Two times a day (BID) | ORAL | Status: DC

## 2012-07-21 NOTE — Patient Instructions (Addendum)
Your physician has recommended you make the following change in your medication: furosemide 40mg  alternating with 20 mg.  Your physician recommends that you schedule a follow-up appointment in: 6 weeks.  Your physician recommends that you return for lab work.

## 2012-07-21 NOTE — Progress Notes (Signed)
Patient ID: [name], male   DOB: 1921/05/08, 79 y.o.   MRN: [id number]   HPI: [name], is a 79 y.o. male who is a former patient of Dr. Caprice Kluver. He presents to the office today to establish cardiology care with me. [name] has a history of known coronary as well as valvular disease. In April 2010 was found to have severe coronary artery disease with 95% left main stenosis, segmental 95 and 90% circumflex stenoses, and total occlusion of the proximal RCA with left to right collaterals. I did perform the catheterization at that time and he also had a 22 mm peak to peak gradient. He underwent CABG revascularization surgery with a LIMA to the LAD, SVG to the diagonal, SVG to the circumflex, and an SVG to the PDA all last a tissue valve early the following year he suffered a CVA in May 2011 appear 2013 he suffered a TIA with almost complete resolution of symptoms. He does have a prior history of left carotid endarterectomy. He last saw Dr. Clarene Duke in July 2013. Patient denies any recent chest pain. He does note ankle swelling right greater than left. In review of the chart reveals his last echo was in 2011.  Past Medical History  Diagnosis Date  . Diabetes mellitus   . Stroke   . Cataract   . CAD (coronary artery disease)   . Carotid stenosis 09/24/11    right bulb/proximal ICA demonstrates a mild amont of fibrous plaque elevating velocitieswithin the proximal and mid segments of the internal carotid artery. Left CEA demonstrats a trace amount of fibrous plaque with  no evidence of a significant diameter reduction, tortuosity or any other vascular abnormality.  . Hypertension 08/28/09    ECHO- EF 50-55%  . Hyperlipidemia 06/02/08    Nuclear stress test- high risk scan EF 29% Post stress LV cavity enlargement suggests multi vessel disease.    Past Surgical History  Procedure Laterality Date  . Appendectomy    . Coronary artery bypass graft      + AV replacement  . Carpal tunnel  release      Left  . Carotid endarterectomy      with darcon patch angioplasty 03/17/2001  . Cardiac catheterization  06/10/2008    No Known Allergies  Current Outpatient Prescriptions  Medication Sig Dispense Refill  . atorvastatin (LIPITOR) 80 MG tablet Take 80 mg by mouth daily. 1/2 tablet daily      . benazepril (LOTENSIN) 10 MG tablet Take 10 mg by mouth 2 (two) times daily.      . carvedilol (COREG) 12.5 MG tablet Take 1 tablet (12.5 mg total) by mouth 2 (two) times daily with a meal.  30 tablet  11  . clopidogrel (PLAVIX) 75 MG tablet Take 1 tablet (75 mg total) by mouth daily.  30 tablet  5  . furosemide (LASIX) 40 MG tablet Take 1 tablet (40 mg total) by mouth every other day. Take 1 tablet alternate with 1/2 tablet daily.  30 tablet  6  . potassium chloride SA (K-DUR,KLOR-CON) 20 MEQ tablet Take 20 mEq by mouth every other day.       . pyridOXINE (VITAMIN B-6) 100 MG tablet Take 100 mg by mouth daily.      Marland Kitchen VITAMIN D, CHOLECALCIFEROL, PO Take 1,000 Units by mouth daily.         No current facility-administered medications for this visit.   Social he is widowed he remains relatively active he  has 2 children. No alcohol tobacco use.  ROS is negative for recent neurologic symptoms. Does admit to leg swelling particularly at the ankles right greater than left. There is a mild shortness of breath. He denies chest pressure. He is unaware of tachycardia palpitations. He denies presyncope or syncope. He denies bleeding per he denies any orthostatic complaints. He denies myalgias other system review is negative the  PE BP 162/74  Pulse 65  Ht 5\' 6"  (1.676 m)  Wt 147 lb (66.679 kg)  BMI 23.74 kg/m2  General: Alert, oriented, no distress.  HEENT: Normocephalic, atraumatic. Pupils round and reactive; sclera anicteric;  Nose without nasal septal hypertrophy Mouth/Parynx benign; Mallinpatti scale 3 Neck: No JVD, no carotid briuts Lungs: clear to ausculatation and percussion; no  wheezing or rales Heart: RRR, s1 s2 normal with a 2/6 systolic murmur in aortic region with no appreciable aortic insufficiency to Abdomen: soft, nontender; no hepatosplenomehaly, BS+; abdominal aorta nontender and not dilated by palpation. Pulses 2+ Extremities: 1+ right ankle swelling, trace left ankle swelling, Homan's sign negative  Neurologic: grossly nonfocal  ECG: Sinus rhythm at 65 beats per minute with poor progression anteriorly V1 through V3  LABS:  BMET    Component Value Date/Time   NA 141 08/27/2009 0903   K 3.8 08/27/2009 0903   CL 107 08/27/2009 0903   CO2 27 08/27/2009 0903   GLUCOSE 156* 08/27/2009 0903   BUN 17 08/27/2009 0903   CREATININE 1.21 08/27/2009 0903   CALCIUM 9.0 08/27/2009 0903   GFRNONAA 57* 08/27/2009 0903   GFRAA  Value: >60        The eGFR has been calculated using the MDRD equation. This calculation has not been validated in all clinical situations. eGFR's persistently <60 mL/min signify possible Chronic Kidney Disease. 08/27/2009 0903     Hepatic Function Panel     Component Value Date/Time   PROT 6.4 08/25/2009 0902   ALBUMIN 3.4* 08/25/2009 0902   AST 31 08/25/2009 0902   ALT 22 08/25/2009 0902   ALKPHOS 39 08/25/2009 0902   BILITOT 1.1 08/25/2009 0902   BILIDIR 0.2 08/25/2009 0902   IBILI 0.9 08/25/2009 0902     CBC    Component Value Date/Time   WBC 7.1 08/25/2009 0902   RBC 4.00* 08/25/2009 0902   HGB 13.0 08/25/2009 0902   HCT 38.1* 08/25/2009 0902   PLT 112* 08/25/2009 0902   MCV 95.4 08/25/2009 0902   MCH 32.6 08/25/2009 0902   MCHC 34.2 08/25/2009 0902   RDW 13.1 08/25/2009 0902   LYMPHSABS 0.6* 08/25/2009 0902   MONOABS 0.4 08/25/2009 0902   EOSABS 0.1 08/25/2009 0902   BASOSABS 0.0 08/25/2009 0902     BNP    Component Value Date/Time   PROBNP 365.0* 09/17/2008 1526    Lipid Panel     Component Value Date/Time   CHOL  Value: 106        ATP III CLASSIFICATION:  <200     mg/dL   Desirable  191-478  mg/dL   Borderline High  >=295    mg/dL   High        07/22/1306 0903     TRIG 111 08/27/2009 0903   HDL 40 08/27/2009 0903   CHOLHDL 2.7 08/27/2009 0903   VLDL 22 08/27/2009 0903   LDLCALC  Value: 44        Total Cholesterol/HDL:CHD Risk Coronary Heart Disease Risk Table  Men   Women  1/2 Average Risk   3.4   3.3  Average Risk       5.0   4.4  2 X Average Risk   9.6   7.1  3 X Average Risk  23.4   11.0        Use the calculated Patient Ratio above and the CHD Risk Table to determine the patient's CHD Risk.        ATP III CLASSIFICATION (LDL):  <100     mg/dL   Optimal  782-956  mg/dL   Near or Above                    Optimal  130-159  mg/dL   Borderline  213-086  mg/dL   High  >578     mg/dL   Very High 05/26/9627 5284     RADIOLOGY: No results found.    ASSESSMENT AND PLAN: [name] is now 79 years old. He is 4 years that is post CABG surgery as well as tissue aortic valve replacement surgery. He has a history of hyperlipidemia as well as hypertension presently he does have signs of ankle edema as well as blood pressure elevation. I am recommending he changed his Lasix from 40 mg every other day to 40 mg alternating with 20 mg a daily basis. I scheduled him for an echo Doppler study to reassess systolic and diastolic function as well as to further evaluate his bioprosthetic valve. He is not having any anginal symptoms his last stress test was in 2010.  I am recommending acomplete the laboratory  be obtained. I will not further titrate his ACE inhibition and that his renal function is stable. Neurologically he seems stable on his current regimen to all see him back in the office in approximately 4 weeks for followup evaluation.     Lennette Bihari, MD, [facility]  07/21/2012 7:02 PM

## 2012-07-29 ENCOUNTER — Telehealth: Payer: Self-pay | Admitting: Family Medicine

## 2012-07-29 ENCOUNTER — Ambulatory Visit (HOSPITAL_COMMUNITY)
Admission: RE | Admit: 2012-07-29 | Discharge: 2012-07-29 | Disposition: A | Discharge status: Home / Self Care | Payer: Medicare Other | Source: Ambulatory Visit | Attending: Cardiovascular Disease | Admitting: Cardiovascular Disease

## 2012-07-29 DIAGNOSIS — I10 Essential (primary) hypertension: Secondary | ICD-10-CM | POA: Insufficient documentation

## 2012-07-29 DIAGNOSIS — E785 Hyperlipidemia, unspecified: Secondary | ICD-10-CM | POA: Insufficient documentation

## 2012-07-29 DIAGNOSIS — E119 Type 2 diabetes mellitus without complications: Secondary | ICD-10-CM | POA: Insufficient documentation

## 2012-07-29 DIAGNOSIS — I079 Rheumatic tricuspid valve disease, unspecified: Secondary | ICD-10-CM | POA: Insufficient documentation

## 2012-07-29 DIAGNOSIS — Z8673 Personal history of transient ischemic attack (TIA), and cerebral infarction without residual deficits: Secondary | ICD-10-CM | POA: Insufficient documentation

## 2012-07-29 DIAGNOSIS — I2510 Atherosclerotic heart disease of native coronary artery without angina pectoris: Secondary | ICD-10-CM | POA: Insufficient documentation

## 2012-07-29 DIAGNOSIS — I059 Rheumatic mitral valve disease, unspecified: Secondary | ICD-10-CM | POA: Insufficient documentation

## 2012-07-29 LAB — COMPREHENSIVE METABOLIC PANEL
ALT: 13 U/L (ref 0–53)
AST: 20 U/L (ref 0–37)
Albumin: 3.6 g/dL (ref 3.5–5.2)
Alkaline Phosphatase: 52 U/L (ref 39–117)
Calcium: 9.5 mg/dL (ref 8.4–10.5)
Chloride: 105 mEq/L (ref 96–112)
Creat: 1.25 mg/dL (ref 0.50–1.35)
Potassium: 4.7 mEq/L (ref 3.5–5.3)

## 2012-07-29 LAB — LIPID PANEL
LDL Cholesterol: 63 mg/dL (ref 0–99)
Total CHOL/HDL Ratio: 2.9 Ratio

## 2012-07-29 LAB — TSH: TSH: 1.255 u[IU]/mL (ref 0.350–4.500)

## 2012-07-29 LAB — CBC
MCV: 91.5 fL (ref 78.0–100.0)
Platelets: 176 10*3/uL (ref 150–400)
RBC: 4.34 MIL/uL (ref 4.22–5.81)
WBC: 6.7 10*3/uL (ref 4.0–10.5)

## 2012-07-29 LAB — HEMOGLOBIN A1C
Hgb A1c MFr Bld: 5.9 % — ABNORMAL HIGH (ref <5.7)
Mean Plasma Glucose: 123 mg/dL — ABNORMAL HIGH (ref <117)

## 2012-07-29 MED ORDER — ATORVASTATIN CALCIUM 80 MG PO TABS: 80.0000 mg | ORAL_TABLET | Freq: Every day | ORAL | Status: DC

## 2012-07-29 NOTE — Progress Notes (Signed)
[facility]   2D echo completed 07/29/2012.   Cindy Breslin Burklow, RDCS  

## 2012-07-29 NOTE — Telephone Encounter (Signed)
Rx Refilled  

## 2012-08-13 ENCOUNTER — Encounter: Payer: Self-pay | Admitting: *Deleted

## 2012-08-13 ENCOUNTER — Inpatient Hospital Stay (HOSPITAL_COMMUNITY)
Admission: EM | Admit: 2012-08-13 | Discharge: 2012-08-16 | DRG: 065 | Disposition: A | Discharge status: Home Health | Payer: Medicare Other | Attending: Internal Medicine | Admitting: Internal Medicine

## 2012-08-13 ENCOUNTER — Emergency Department (HOSPITAL_COMMUNITY): Payer: Medicare Other

## 2012-08-13 ENCOUNTER — Encounter (HOSPITAL_COMMUNITY): Payer: Self-pay | Admitting: Emergency Medicine

## 2012-08-13 DIAGNOSIS — E119 Type 2 diabetes mellitus without complications: Secondary | ICD-10-CM

## 2012-08-13 DIAGNOSIS — Z7902 Long term (current) use of antithrombotics/antiplatelets: Secondary | ICD-10-CM

## 2012-08-13 DIAGNOSIS — I6350 Cerebral infarction due to unspecified occlusion or stenosis of unspecified cerebral artery: Secondary | ICD-10-CM

## 2012-08-13 DIAGNOSIS — I509 Heart failure, unspecified: Secondary | ICD-10-CM | POA: Diagnosis present

## 2012-08-13 DIAGNOSIS — I25810 Atherosclerosis of coronary artery bypass graft(s) without angina pectoris: Secondary | ICD-10-CM

## 2012-08-13 DIAGNOSIS — I6529 Occlusion and stenosis of unspecified carotid artery: Secondary | ICD-10-CM | POA: Diagnosis present

## 2012-08-13 DIAGNOSIS — G8190 Hemiplegia, unspecified affecting unspecified side: Secondary | ICD-10-CM | POA: Diagnosis present

## 2012-08-13 DIAGNOSIS — Z87891 Personal history of nicotine dependence: Secondary | ICD-10-CM

## 2012-08-13 DIAGNOSIS — Z954 Presence of other heart-valve replacement: Secondary | ICD-10-CM

## 2012-08-13 DIAGNOSIS — Z8679 Personal history of other diseases of the circulatory system: Secondary | ICD-10-CM

## 2012-08-13 DIAGNOSIS — Z9889 Other specified postprocedural states: Secondary | ICD-10-CM

## 2012-08-13 DIAGNOSIS — R1310 Dysphagia, unspecified: Secondary | ICD-10-CM | POA: Diagnosis present

## 2012-08-13 DIAGNOSIS — I6340 Cerebral infarction due to embolism of unspecified cerebral artery: Principal | ICD-10-CM | POA: Diagnosis present

## 2012-08-13 DIAGNOSIS — I639 Cerebral infarction, unspecified: Secondary | ICD-10-CM

## 2012-08-13 DIAGNOSIS — E785 Hyperlipidemia, unspecified: Secondary | ICD-10-CM | POA: Diagnosis present

## 2012-08-13 DIAGNOSIS — I10 Essential (primary) hypertension: Secondary | ICD-10-CM

## 2012-08-13 DIAGNOSIS — R4701 Aphasia: Secondary | ICD-10-CM | POA: Diagnosis present

## 2012-08-13 DIAGNOSIS — Z79899 Other long term (current) drug therapy: Secondary | ICD-10-CM

## 2012-08-13 LAB — CREATININE, SERUM
Creatinine, Ser: 1.25 mg/dL (ref 0.50–1.35)
GFR calc Af Amer: 57 mL/min — ABNORMAL LOW (ref >90)

## 2012-08-13 LAB — DIFFERENTIAL
Eosinophils Absolute: 0.1 10*3/uL (ref 0.0–0.7)
Lymphocytes Relative: 8 % — ABNORMAL LOW (ref 12–46)
Lymphs Abs: 0.7 10*3/uL (ref 0.7–4.0)
Monocytes Relative: 10 % (ref 3–12)
Neutro Abs: 7.4 10*3/uL (ref 1.7–7.7)
Neutrophils Relative %: 82 % — ABNORMAL HIGH (ref 43–77)

## 2012-08-13 LAB — RAPID URINE DRUG SCREEN, HOSP PERFORMED
Amphetamines: NOT DETECTED
Barbiturates: NOT DETECTED
Benzodiazepines: NOT DETECTED

## 2012-08-13 LAB — POCT I-STAT, CHEM 8
Calcium, Ion: 1.23 mmol/L (ref 1.13–1.30)
HCT: 38 % — ABNORMAL LOW (ref 39.0–52.0)
Sodium: 143 mEq/L (ref 135–145)
TCO2: 31 mmol/L (ref 0–100)

## 2012-08-13 LAB — CBC
Hemoglobin: 13.1 g/dL (ref 13.0–17.0)
Hemoglobin: 12.7 g/dL — ABNORMAL LOW (ref 13.0–17.0)
Platelets: 155 10*3/uL (ref 150–400)
Platelets: 149 10*3/uL — ABNORMAL LOW (ref 150–400)
RBC: 4.2 MIL/uL — ABNORMAL LOW (ref 4.22–5.81)
RBC: 4.12 MIL/uL — ABNORMAL LOW (ref 4.22–5.81)
WBC: 9 10*3/uL (ref 4.0–10.5)
WBC: 7 10*3/uL (ref 4.0–10.5)

## 2012-08-13 LAB — URINALYSIS, ROUTINE W REFLEX MICROSCOPIC
Ketones, ur: NEGATIVE mg/dL
Leukocytes, UA: NEGATIVE
Nitrite: NEGATIVE
Protein, ur: NEGATIVE mg/dL
pH: 7 (ref 5.0–8.0)

## 2012-08-13 LAB — COMPREHENSIVE METABOLIC PANEL
ALT: 13 U/L (ref 0–53)
Alkaline Phosphatase: 53 U/L (ref 39–117)
BUN: 24 mg/dL — ABNORMAL HIGH (ref 6–23)
Chloride: 102 mEq/L (ref 96–112)
GFR calc Af Amer: 58 mL/min — ABNORMAL LOW (ref >90)
Glucose, Bld: 128 mg/dL — ABNORMAL HIGH (ref 70–99)
Potassium: 4.8 mEq/L (ref 3.5–5.1)
Sodium: 142 mEq/L (ref 135–145)
Total Bilirubin: 0.9 mg/dL (ref 0.3–1.2)
Total Protein: 6.7 g/dL (ref 6.0–8.3)

## 2012-08-13 LAB — GLUCOSE, CAPILLARY

## 2012-08-13 LAB — APTT: aPTT: 31 seconds (ref 24–37)

## 2012-08-13 LAB — PROTIME-INR
INR: 1.03 (ref 0.00–1.49)
Prothrombin Time: 13.3 seconds (ref 11.6–15.2)

## 2012-08-13 LAB — POCT I-STAT TROPONIN I: Troponin i, poc: 0.01 ng/mL (ref 0.00–0.08)

## 2012-08-13 MED ORDER — ENOXAPARIN SODIUM 40 MG/0.4ML ~~LOC~~ SOLN
40.0000 mg | SUBCUTANEOUS | Status: DC
  Administered 2012-08-13 – 2012-08-15 (×3): 40 mg via SUBCUTANEOUS
  Filled 2012-08-13 (×4): qty 0.4

## 2012-08-13 MED ORDER — ATORVASTATIN CALCIUM 80 MG PO TABS
80.0000 mg | ORAL_TABLET | Freq: Every day | ORAL | Status: DC
  Administered 2012-08-14 – 2012-08-16 (×2): 80 mg via ORAL
  Filled 2012-08-13 (×4): qty 1

## 2012-08-13 MED ORDER — ONDANSETRON HCL 4 MG/2ML IJ SOLN: 4.0000 mg | Freq: Three times a day (TID) | INTRAMUSCULAR | Status: AC | PRN

## 2012-08-13 MED ORDER — VITAMIN B-6 100 MG PO TABS
100.0000 mg | ORAL_TABLET | Freq: Every day | ORAL | Status: DC
  Administered 2012-08-14 – 2012-08-16 (×2): 100 mg via ORAL
  Filled 2012-08-13 (×4): qty 1

## 2012-08-13 MED ORDER — POTASSIUM CHLORIDE CRYS ER 20 MEQ PO TBCR
20.0000 meq | EXTENDED_RELEASE_TABLET | Freq: Every day | ORAL | Status: DC
  Administered 2012-08-14 – 2012-08-16 (×2): 20 meq via ORAL
  Filled 2012-08-13 (×3): qty 1

## 2012-08-13 MED ORDER — BENAZEPRIL HCL 10 MG PO TABS
10.0000 mg | ORAL_TABLET | Freq: Every day | ORAL | Status: DC
  Filled 2012-08-13: qty 1

## 2012-08-13 MED ORDER — SODIUM CHLORIDE 0.9 % IV SOLN
INTRAVENOUS | Status: DC
  Administered 2012-08-13 – 2012-08-15 (×4): via INTRAVENOUS

## 2012-08-13 MED ORDER — FUROSEMIDE 40 MG PO TABS
40.0000 mg | ORAL_TABLET | ORAL | Status: DC
  Administered 2012-08-16: 40 mg via ORAL
  Filled 2012-08-13: qty 1

## 2012-08-13 MED ORDER — SODIUM CHLORIDE 0.9 % IV SOLN: INTRAVENOUS | Status: AC

## 2012-08-13 MED ORDER — CARVEDILOL 12.5 MG PO TABS
12.5000 mg | ORAL_TABLET | Freq: Two times a day (BID) | ORAL | Status: DC
  Filled 2012-08-13: qty 1

## 2012-08-13 MED ORDER — INSULIN ASPART 100 UNIT/ML ~~LOC~~ SOLN
0.0000 [IU] | SUBCUTANEOUS | Status: DC
  Administered 2012-08-14: 2 [IU] via SUBCUTANEOUS

## 2012-08-13 MED ORDER — SODIUM CHLORIDE 0.9 % IJ SOLN
3.0000 mL | Freq: Two times a day (BID) | INTRAMUSCULAR | Status: DC
  Administered 2012-08-14 – 2012-08-15 (×2): 3 mL via INTRAVENOUS

## 2012-08-13 MED ORDER — CLOPIDOGREL BISULFATE 75 MG PO TABS
75.0000 mg | ORAL_TABLET | Freq: Every day | ORAL | Status: DC
  Administered 2012-08-14 – 2012-08-16 (×2): 75 mg via ORAL
  Filled 2012-08-13 (×3): qty 1

## 2012-08-13 NOTE — Progress Notes (Signed)
Quick Note:    Result letter sent to patient  ______

## 2012-08-13 NOTE — ED Provider Notes (Signed)
History    CSN: [id number] Arrival date & time 08/13/12  1350  First MD Initiated Contact with Patient 08/13/12 1353     Chief Complaint  Patient presents with  . Extremity Weakness    rt sided.    (Consider location/radiation/quality/duration/timing/severity/associated sxs/prior Treatment) HPI Comments: Patient arrives via EMS with difficulty walking, right leg heaviness, worsening slurred speech since this morning. Daughter states patient had a stroke 2 years ago in vision speech problems. He thinks his speech has gotten progressively worse. The last time he was normal was last night. Today while trying to walk and difficulty moving his right leg difficult to get out of the chair. He felt like he is going to fall over. Denies vertigo or syncope. Denies any chest pain or shortness of breath. Denies any dizziness or lightheadedness. Daughter feels his speech is at baseline now. The patient does not think he is different than baseline.  The history is provided by the patient, the EMS personnel and a relative.   Past Medical History  Diagnosis Date  . Diabetes mellitus   . Stroke   . Cataract   . CAD (coronary artery disease)   . Carotid stenosis 09/24/11    right bulb/proximal ICA demonstrates a mild amont of fibrous plaque elevating velocitieswithin the proximal and mid segments of the internal carotid artery. Left CEA demonstrats a trace amount of fibrous plaque with  no evidence of a significant diameter reduction, tortuosity or any other vascular abnormality.  . Hypertension 08/28/09    ECHO- EF 50-55%  . Hyperlipidemia 06/02/08    Nuclear stress test- high risk scan EF 29% Post stress LV cavity enlargement suggests multi vessel disease.   Past Surgical History  Procedure Laterality Date  . Appendectomy    . Coronary artery bypass graft      + AV replacement  . Carpal tunnel release      Left  . Carotid endarterectomy      with darcon patch angioplasty 03/17/2001  . Cardiac  catheterization  06/10/2008   History reviewed. No pertinent family history. History  Substance Use Topics  . Smoking status: Former Smoker    Quit date: 02/28/1978  . Smokeless tobacco: Never Used  . Alcohol Use: No    Review of Systems  Constitutional: Negative for fever, activity change and appetite change.  HENT: Negative for congestion and rhinorrhea.   Respiratory: Negative for cough, chest tightness and shortness of breath.   Cardiovascular: Negative for chest pain.  Gastrointestinal: Negative for nausea, vomiting and abdominal pain.  Genitourinary: Negative for dysuria.  Musculoskeletal: Positive for gait problem and extremity weakness. Negative for back pain.  Neurological: Positive for speech difficulty, weakness and numbness. Negative for dizziness, syncope, light-headedness and headaches.  A complete 10 system review of systems was obtained and all systems are negative except as noted in the HPI and PMH.    Allergies  Review of patient's allergies indicates no known allergies.  Home Medications   Current Outpatient Rx  Name  Route  Sig  Dispense  Refill  . atorvastatin (LIPITOR) 80 MG tablet   Oral   Take 1 tablet (80 mg total) by mouth daily.   30 tablet   5   . benazepril (LOTENSIN) 10 MG tablet   Oral   Take 10 mg by mouth daily.          . carvedilol (COREG) 12.5 MG tablet   Oral   Take 1 tablet (12.5 mg total) by mouth 2 (  two) times daily with a meal.   30 tablet   11     Need to be seen for further refills   . clopidogrel (PLAVIX) 75 MG tablet   Oral   Take 1 tablet (75 mg total) by mouth daily.   30 tablet   5   . potassium chloride SA (K-DUR,KLOR-CON) 20 MEQ tablet   Oral   Take 20 mEq by mouth daily.          Marland Kitchen pyridOXINE (VITAMIN B-6) 100 MG tablet   Oral   Take 100 mg by mouth daily.         Marland Kitchen VITAMIN D, CHOLECALCIFEROL, PO   Oral   Take 1,000 Units by mouth daily.            BP 152/58  Pulse 80  Temp(Src) 97.5 F  (36.4 C) (Oral)  Resp 20  SpO2 99% Physical Exam  Constitutional: He is oriented to person, place, and time. He appears well-developed and well-nourished. No distress.  HENT:  Head: Normocephalic and atraumatic.  Mouth/Throat: Oropharynx is clear and moist. No oropharyngeal exudate.  Eyes: Conjunctivae and EOM are normal. Pupils are equal, round, and reactive to light.  Neck: Normal range of motion. Neck supple.  Cardiovascular: Normal rate, regular rhythm and normal heart sounds.   No murmur heard. Pulmonary/Chest: Effort normal and breath sounds normal. No respiratory distress.  Abdominal: Soft. There is no tenderness. There is no rebound and no guarding.  Musculoskeletal: Normal range of motion. He exhibits no edema and no tenderness.  Neurological: He is alert and oriented to person, place, and time. No cranial nerve deficit. He exhibits normal muscle tone. Coordination normal.  CN 2-12 intact, no ataxia on finger to nose, no nystagmus, equal grip strength bilaterally, able to hold right leg off the bed and for 5 seconds, able the left leg off the bed for 5 seconds, slight R pronator drift   Skin: Skin is warm.    ED Course  Procedures (including critical care time) Labs Reviewed  CBC - Abnormal; Notable for the following:    RBC 4.20 (*)    HCT 38.5 (*)    All other components within normal limits  DIFFERENTIAL - Abnormal; Notable for the following:    Neutrophils Relative % 82 (*)    Lymphocytes Relative 8 (*)    All other components within normal limits  COMPREHENSIVE METABOLIC PANEL - Abnormal; Notable for the following:    CO2 33 (*)    Glucose, Bld 128 (*)    BUN 24 (*)    Albumin 3.2 (*)    GFR calc non Af Amer 50 (*)    GFR calc Af Amer 58 (*)    All other components within normal limits  GLUCOSE, CAPILLARY - Abnormal; Notable for the following:    Glucose-Capillary 118 (*)    All other components within normal limits  CBC - Abnormal; Notable for the following:     RBC 4.12 (*)    Hemoglobin 12.7 (*)    HCT 37.4 (*)    Platelets 149 (*)    All other components within normal limits  CREATININE, SERUM - Abnormal; Notable for the following:    GFR calc non Af Amer 50 (*)    GFR calc Af Amer 57 (*)    All other components within normal limits  POCT I-STAT, CHEM 8 - Abnormal; Notable for the following:    BUN 25 (*)    Glucose, Bld 129 (*)  Hemoglobin 12.9 (*)    HCT 38.0 (*)    All other components within normal limits  ETHANOL  PROTIME-INR  APTT  TROPONIN I  URINE RAPID DRUG SCREEN (HOSP PERFORMED)  URINALYSIS, ROUTINE W REFLEX MICROSCOPIC  COMPREHENSIVE METABOLIC PANEL  CBC  POCT I-STAT TROPONIN I   Ct Head Wo Contrast  08/13/2012   *RADIOLOGY REPORT*  Clinical Data: Right sided heaviness.  History of stroke.  Speech getting worse.  CT HEAD WITHOUT CONTRAST  Technique:  Contiguous axial images were obtained from the base of the skull through the vertex without contrast.  Comparison: 08/25/2009 CT and MR.  Findings: No intracranial hemorrhage.  Remote infarcts frontal lobes bilaterally and right occipital lobe with encephalomalacia.  Remote corona radiata infarcts.  Small vessel disease type changes.  No CT evidence of large acute infarct.  Global atrophy without hydrocephalus.  No intracranial mass lesion detected on this unenhanced exam.  Vascular calcifications.  Mastoid air cells, middle ear cavities and majority paranasal sinuses are clear with exception of mild mucosal thickening left sphenoid sinus air cell.  IMPRESSION: Remote infarcts without CT evidence of large acute infarct.  No intracranial hemorrhage.  Atrophy.  Mild mucosal thickening left sphenoid sinus.   Original Report Authenticated By: Lacy Duverney, M.D.   Mr Brain Wo Contrast  08/13/2012   *RADIOLOGY REPORT*  Clinical Data: Right lower extremity weakness. .  MRI HEAD WITHOUT CONTRAST  Technique:  Multiplanar, multiecho pulse sequences of the brain and surrounding structures  were obtained according to standard protocol without intravenous contrast.  Comparison: 08/13/2012 CT.  08/25/2009 MR.  Findings: Subtle small acute non hemorrhagic infarct posterior left corona radiata extending to the posterior limb of the left internal capsule.  Remote bilateral frontal lobe infarcts and right occipital lobe infarct with encephalomalacia.  Prominent small vessel disease type changes.  No intracranial hemorrhage.  Global atrophy without hydrocephalus.  No intracranial mass lesion detected on this unenhanced exam.  Major intracranial vascular structures are patent.  Left vertebral artery is once again noted to be small.  Mild transverse ligament hypertrophy.  Cervical medullary junction, pituitary region, pineal region and orbital structures unremarkable.  IMPRESSION: Subtle small acute non hemorrhagic infarct posterior left corona radiata extending to the posterior limb of the left internal capsule.  Please see above.  Critical Value/emergent results were called by telephone at the time of interpretation on 08/13/2012 at 6:00 p.m. to Dr. Manus Gunning, who verbally acknowledged these results.   Original Report Authenticated By: Lacy Duverney, M.D.   Mr Lumbar Spine Wo Contrast  08/13/2012   *RADIOLOGY REPORT*  Clinical Data:  79 year old male right side weakness.  Right lower extremity heaviness and weakness beginning today.  MRI LUMBAR SPINE WITHOUT CONTRAST  Technique:  Multiplanar and multiecho pulse sequences of the lumbar spine were obtained without intravenous contrast.  Comparison: 08/25/2009 abdominal radiographs.  Findings: Normal lumbar segmentation on comparison.  Vertebral height and alignment within normal limits.  Occasional mild endplate marrow edema appears to be degenerative. Visualized paraspinal soft tissues are within normal limits.  No acute osseous abnormality identified.   Visualized lower thoracic spinal cord is normal with conus medularis at L1.  Negative visualized abdominal  viscera.  Sigmoid diverticulosis.  T10-T11:  Negative.  T11-T12:  Negative.  T12-L1:  Negative.  L1-L2:  Moderate to severe disc space loss.  Anterior eccentric circumferential disc osteophyte complex.  No stenosis.  L2-L3:  Moderate to severe disc space loss.  Anterior eccentric circumferential disc osteophyte complex.  Mild  facet hypertrophy. No significant stenosis.  L3-L4:  Moderate disc space loss.  Right eccentric circumferential disc osteophyte complex.  Mild to moderate facet and ligament flavum hypertrophy.  Mild right lateral recess stenosis. Borderline to mild spinal stenosis and bilateral L3 foraminal stenosis.  L4-L5:  Mild to moderate circumferential disc osteophyte complex. Mild facet and ligament flavum hypertrophy.  Mild right L4 foraminal stenosis.  L5-S1:  Bulky right far lateral disc osteophyte complex.  Mild facet hypertrophy.  No spinal or lateral recess stenosis.  Mild left and mild to moderate right L5 foraminal stenosis.  IMPRESSION: 1.  Multilevel lumbar disc and endplate degeneration, but only borderline to mild spinal stenosis at the L3-L4 level occurs. 2.  Intermittent predominately mild lumbar neural foraminal stenosis.  There is mild to moderate right L5 foraminal stenosis.  3.  Occasional mild endplate marrow edema, appears to be degenerative in nature.   Original Report Authenticated By: Erskine Speed, M.D.   1. CVA (cerebral infarction)   2. DM (diabetes mellitus)   3. Unspecified essential hypertension   4. CAD (coronary artery disease) of artery bypass graft     MDM  Difficulty moving right leg and difficulty walking since this morning. Last seen normal last night. No dizziness, lightheadedness, vertigo.  CT head shows no hemorrhage or acute change. MRI will be obtained to rule out acute stroke.  Acute infarct seen on MRI in left corona radiata.   patient has already been taking aspirin and Plavix.  Neurology will consult, Dr. Roseanne Reno. Dr. Rhona Leavens to admit.  Date:  08/13/2012  Rate: 75  Rhythm: normal sinus rhythm  QRS Axis: normal  Intervals: normal  ST/T Wave abnormalities: normal  Conduction Disutrbances:none  Narrative Interpretation: anterior Q waves   Old EKG Reviewed: unchanged     Glynn Octave, MD 08/13/12 1946

## 2012-08-13 NOTE — ED Notes (Signed)
Pt woke up this morning with rt leg heaviness. Pt called his daughter around 81 and has progressively gotten worse. Pt has a hx of stroke. Pt states he had some trouble ambulating. Daughter states his speech has gotten progressively worse. Vitals 161/75, HR 77, 94% RA, CBG 124. 20 LAC.

## 2012-08-13 NOTE — Consult Note (Signed)
Referring Physician:  Dr. Manus Gunning    Chief Complaint: New-onset right-sided weakness and numbness.  HPI: [name] is an 79 y.o. male Mr. diabetes mellitus, hypertension, hyperlipidemia, previous stroke and carotid stenosis presenting with new onset weakness involving his right side. He was last seen normal at bedtime at about 10:00 last night. He woke up with right-sided weakness which got worse prior to coming to the emergency room. He's been taking aspirin as well as Plavix daily. MRI of his brain showed a small non-hemorrhagic ischemic infarction involving the left corona radiata extending to the posterior limb internal capsule. NIH stroke score was 4.  LSN: 10 PM on 08/12/2012 tPA Given: No: Beyond time window for treatment consideration MRankin: 2  Past Medical History  Diagnosis Date  . Diabetes mellitus   . Stroke   . Cataract   . CAD (coronary artery disease)   . Carotid stenosis 09/24/11    right bulb/proximal ICA demonstrates a mild amont of fibrous plaque elevating velocitieswithin the proximal and mid segments of the internal carotid artery. Left CEA demonstrats a trace amount of fibrous plaque with  no evidence of a significant diameter reduction, tortuosity or any other vascular abnormality.  . Hypertension 08/28/09    ECHO- EF 50-55%  . Hyperlipidemia 06/02/08    Nuclear stress test- high risk scan EF 29% Post stress LV cavity enlargement suggests multi vessel disease.    History reviewed. No pertinent family history.   Medications: I have reviewed the patient's current medications. Prior to Admission:  atorvastatin (LIPITOR) 80 MG tablet  Take 1 tablet (80 mg total) by mouth daily.  07/29/12   Yes  Donita Brooks, MD   benazepril (LOTENSIN) 10 MG tablet  Take 10 mg by mouth daily.  07/04/12   Yes  Donita Brooks, MD   carvedilol (COREG) 12.5 MG tablet  Take 1 tablet (12.5 mg total) by mouth 2 (two) times daily with a meal.  07/21/12   Yes  Lennette Bihari, MD    clopidogrel (PLAVIX) 75 MG tablet  Take 1 tablet (75 mg total) by mouth daily.  06/17/12   Yes  Donita Brooks, MD   furosemide (LASIX) 40 MG tablet  Take 40 mg by mouth daily.  07/21/12   Yes  Lennette Bihari, MD   potassium chloride SA (K-DUR,KLOR-CON) 20 MEQ tablet  Take 20 mEq by mouth daily.    Yes  Historical Provider, MD   pyridOXINE (VITAMIN B-6) 100 MG tablet  Take 100 mg by mouth daily.    Yes  Historical Provider, MD   VITAMIN D, CHOLECALCIFEROL, PO  Take 1,000 Units by mouth daily.    Yes  Historical Provider, MD       Physical Examination: Blood pressure 144/69, pulse 77, temperature 97.5 F (36.4 C), temperature source Oral, resp. rate 27, SpO2 97.00%.  Neurologic Examination: Mental Status: Alert, disoriented to correct age and current month.  Speech fluent without evidence of aphasia. Able to follow commands without difficulty. Cranial Nerves: II-Visual fields were normal to finger counting. III/IV/VI-Pupils were equal and reacted. Extraocular movements were full and conjugate.    V/VII-no facial numbness and no facial weakness. VIII-normal. X-normal speech and symmetrical palatal movement. Motor: Right upper extremity mild pronator drift; mild right hip flexor weakness with manual muscle testing; motor exam otherwise unremarkable. Sensory: Normal throughout. Deep Tendon Reflexes: 2+ and symmetric. Plantars: Flexor bilaterally Cerebellar: Normal finger-to-nose testing on the left; slightly impaired on the right. Carotid auscultation:  Normal  Ct Head Wo Contrast  08/13/2012   *RADIOLOGY REPORT*  Clinical Data: Right sided heaviness.  History of stroke.  Speech getting worse.  CT HEAD WITHOUT CONTRAST  Technique:  Contiguous axial images were obtained from the base of the skull through the vertex without contrast.  Comparison: 08/25/2009 CT and MR.  Findings: No intracranial hemorrhage.  Remote infarcts frontal lobes bilaterally and right occipital lobe with  encephalomalacia.  Remote corona radiata infarcts.  Small vessel disease type changes.  No CT evidence of large acute infarct.  Global atrophy without hydrocephalus.  No intracranial mass lesion detected on this unenhanced exam.  Vascular calcifications.  Mastoid air cells, middle ear cavities and majority paranasal sinuses are clear with exception of mild mucosal thickening left sphenoid sinus air cell.  IMPRESSION: Remote infarcts without CT evidence of large acute infarct.  No intracranial hemorrhage.  Atrophy.  Mild mucosal thickening left sphenoid sinus.   Original Report Authenticated By: Lacy Duverney, M.D.   Mr Brain Wo Contrast  08/13/2012   *RADIOLOGY REPORT*  Clinical Data: Right lower extremity weakness. .  MRI HEAD WITHOUT CONTRAST  Technique:  Multiplanar, multiecho pulse sequences of the brain and surrounding structures were obtained according to standard protocol without intravenous contrast.  Comparison: 08/13/2012 CT.  08/25/2009 MR.  Findings: Subtle small acute non hemorrhagic infarct posterior left corona radiata extending to the posterior limb of the left internal capsule.  Remote bilateral frontal lobe infarcts and right occipital lobe infarct with encephalomalacia.  Prominent small vessel disease type changes.  No intracranial hemorrhage.  Global atrophy without hydrocephalus.  No intracranial mass lesion detected on this unenhanced exam.  Major intracranial vascular structures are patent.  Left vertebral artery is once again noted to be small.  Mild transverse ligament hypertrophy.  Cervical medullary junction, pituitary region, pineal region and orbital structures unremarkable.  IMPRESSION: Subtle small acute non hemorrhagic infarct posterior left corona radiata extending to the posterior limb of the left internal capsule.  Please see above.  Critical Value/emergent results were called by telephone at the time of interpretation on 08/13/2012 at 6:00 p.m. to Dr. Manus Gunning, who verbally  acknowledged these results.   Original Report Authenticated By: Lacy Duverney, M.D.   Mr Lumbar Spine Wo Contrast  08/13/2012   *RADIOLOGY REPORT*  Clinical Data:  79 year old male right side weakness.  Right lower extremity heaviness and weakness beginning today.  MRI LUMBAR SPINE WITHOUT CONTRAST  Technique:  Multiplanar and multiecho pulse sequences of the lumbar spine were obtained without intravenous contrast.  Comparison: 08/25/2009 abdominal radiographs.  Findings: Normal lumbar segmentation on comparison.  Vertebral height and alignment within normal limits.  Occasional mild endplate marrow edema appears to be degenerative. Visualized paraspinal soft tissues are within normal limits.  No acute osseous abnormality identified.   Visualized lower thoracic spinal cord is normal with conus medularis at L1.  Negative visualized abdominal viscera.  Sigmoid diverticulosis.  T10-T11:  Negative.  T11-T12:  Negative.  T12-L1:  Negative.  L1-L2:  Moderate to severe disc space loss.  Anterior eccentric circumferential disc osteophyte complex.  No stenosis.  L2-L3:  Moderate to severe disc space loss.  Anterior eccentric circumferential disc osteophyte complex.  Mild facet hypertrophy. No significant stenosis.  L3-L4:  Moderate disc space loss.  Right eccentric circumferential disc osteophyte complex.  Mild to moderate facet and ligament flavum hypertrophy.  Mild right lateral recess stenosis. Borderline to mild spinal stenosis and bilateral L3 foraminal stenosis.  L4-L5:  Mild to moderate circumferential  disc osteophyte complex. Mild facet and ligament flavum hypertrophy.  Mild right L4 foraminal stenosis.  L5-S1:  Bulky right far lateral disc osteophyte complex.  Mild facet hypertrophy.  No spinal or lateral recess stenosis.  Mild left and mild to moderate right L5 foraminal stenosis.  IMPRESSION: 1.  Multilevel lumbar disc and endplate degeneration, but only borderline to mild spinal stenosis at the L3-L4 level  occurs. 2.  Intermittent predominately mild lumbar neural foraminal stenosis.  There is mild to moderate right L5 foraminal stenosis.  3.  Occasional mild endplate marrow edema, appears to be degenerative in nature.   Original Report Authenticated By: Erskine Speed, M.D.    Assessment: 79 y.o. male acute left subcortical small vessel ischemic stroke involving left corona radiata and extending to posterior limb of left internal capsule.   Stroke Risk Factors - carotid stenosis, diabetes mellitus, hyperlipidemia and hypertension  Plan: 1. HgbA1c, fasting lipid panel 2. MRA  of the brain without contrast 3. PT consult, OT consult, Speech consult 4. Echocardiogram 5. Carotid dopplers 6. Prophylactic therapy-Antiplatelet med: Aspirin 81 mg per day and Plavix 75 mg per day 7. Risk factor modification 8. Telemetry monitoring   C.R. Roseanne Reno, MD [facility] [phone] 08/13/2012, 6:45 PM

## 2012-08-13 NOTE — H&P (Signed)
[facility] History and Physical  [name] [name] DOB: 1921/09/26 DOA: 08/13/2012  Referring physician: Emergency Department PCP: Leo Grosser, MD  Specialists:   Chief Complaint: CVA  HPI: [name] is a 79 y.o. male with a hx of prior cva, cad, DM, htn, and chf who presents to the ED with worsening R sided weakness and slurred speech that started on the morning of admission. Prior to this, the patient had been continued on both ASA and plavix. In the ED, the pt was noted to have an acute infarct of the post L corona radiata. On further questioning, the patient was noted by family to have difficulty swallowing water and also expressive difficulties. No chest pain or SOB.  Review of Systems: R sided weakness, dysphagia, expressive aphasia, all other ROS reviewed and are normal  Past Medical History  Diagnosis Date  . Diabetes mellitus   . Stroke   . Cataract   . CAD (coronary artery disease)   . Carotid stenosis 09/24/11    right bulb/proximal ICA demonstrates a mild amont of fibrous plaque elevating velocitieswithin the proximal and mid segments of the internal carotid artery. Left CEA demonstrats a trace amount of fibrous plaque with  no evidence of a significant diameter reduction, tortuosity or any other vascular abnormality.  . Hypertension 08/28/09    ECHO- EF 50-55%  . Hyperlipidemia 06/02/08    Nuclear stress test- high risk scan EF 29% Post stress LV cavity enlargement suggests multi vessel disease.   Past Surgical History  Procedure Laterality Date  . Appendectomy    . Coronary artery bypass graft      + AV replacement  . Carpal tunnel release      Left  . Carotid endarterectomy      with darcon patch angioplasty 03/17/2001  . Cardiac catheterization  06/10/2008   Social History:  reports that he quit smoking about 34 years ago. He has never used smokeless tobacco. He reports that he does not drink alcohol or use illicit drugs.  No Known  Allergies  History reviewed. No pertinent family history.   Prior to Admission medications   Medication Sig Start Date End Date Taking? Authorizing Provider  atorvastatin (LIPITOR) 80 MG tablet Take 1 tablet (80 mg total) by mouth daily. 07/29/12  Yes Donita Brooks, MD  benazepril (LOTENSIN) 10 MG tablet Take 10 mg by mouth daily.  07/04/12  Yes Donita Brooks, MD  carvedilol (COREG) 12.5 MG tablet Take 1 tablet (12.5 mg total) by mouth 2 (two) times daily with a meal. 07/21/12  Yes Lennette Bihari, MD  clopidogrel (PLAVIX) 75 MG tablet Take 1 tablet (75 mg total) by mouth daily. 06/17/12  Yes Donita Brooks, MD  furosemide (LASIX) 40 MG tablet Take 40 mg by mouth daily. 07/21/12  Yes Lennette Bihari, MD  potassium chloride SA (K-DUR,KLOR-CON) 20 MEQ tablet Take 20 mEq by mouth daily.    Yes Historical Provider, MD  pyridOXINE (VITAMIN B-6) 100 MG tablet Take 100 mg by mouth daily.   Yes Historical Provider, MD  VITAMIN D, CHOLECALCIFEROL, PO Take 1,000 Units by mouth daily.     Yes Historical Provider, MD   Physical Exam: Filed Vitals:   08/13/12 1353 08/13/12 1401 08/13/12 1600  BP:  144/69   Pulse:  77   Temp:  98 F (36.7 C) 97.5 F (36.4 C)  TempSrc:  Oral   Resp:  27   SpO2: 94% 97%  General:  Awake, in nad  Eyes: PERRL B  ENT: membranes moist, dentition fair  Neck: neck supple, trachea midline  Cardiovascular: regular, s1, s2  Respiratory: normal resp effort, no wheezing  Abdomen: soft, nondistended  Skin: no clubbing or cyanosis, good skin turgor  Musculoskeletal: perfused, no clubbing or cyanosis  Psychiatric: normal  Neurologic: slight R sided facial droop, 4/5 strength throughout  Labs on Admission:  Basic Metabolic Panel:  Recent Labs Lab 08/13/12 1409 08/13/12 1429  NA 142 143  K 4.8 4.5  CL 102 103  CO2 33*  --   GLUCOSE 128* 129*  BUN 24* 25*  CREATININE 1.24 1.30  CALCIUM 9.5  --    Liver Function Tests:  Recent Labs Lab  08/13/12 1409  AST 22  ALT 13  ALKPHOS 53  BILITOT 0.9  PROT 6.7  ALBUMIN 3.2*   No results found for this basename: LIPASE, AMYLASE,  in the last 168 hours No results found for this basename: AMMONIA,  in the last 168 hours CBC:  Recent Labs Lab 08/13/12 1409 08/13/12 1429  WBC 9.0  --   NEUTROABS 7.4  --   HGB 13.1 12.9*  HCT 38.5* 38.0*  MCV 91.7  --   PLT 155  --    Cardiac Enzymes:  Recent Labs Lab 08/13/12 1408  TROPONINI <0.30    BNP (last 3 results) No results found for this basename: PROBNP,  in the last 8760 hours CBG:  Recent Labs Lab 08/13/12 1558  GLUCAP 118*    Radiological Exams on Admission: Ct Head Wo Contrast  08/13/2012   *RADIOLOGY REPORT*  Clinical Data: Right sided heaviness.  History of stroke.  Speech getting worse.  CT HEAD WITHOUT CONTRAST  Technique:  Contiguous axial images were obtained from the base of the skull through the vertex without contrast.  Comparison: 08/25/2009 CT and MR.  Findings: No intracranial hemorrhage.  Remote infarcts frontal lobes bilaterally and right occipital lobe with encephalomalacia.  Remote corona radiata infarcts.  Small vessel disease type changes.  No CT evidence of large acute infarct.  Global atrophy without hydrocephalus.  No intracranial mass lesion detected on this unenhanced exam.  Vascular calcifications.  Mastoid air cells, middle ear cavities and majority paranasal sinuses are clear with exception of mild mucosal thickening left sphenoid sinus air cell.  IMPRESSION: Remote infarcts without CT evidence of large acute infarct.  No intracranial hemorrhage.  Atrophy.  Mild mucosal thickening left sphenoid sinus.   Original Report Authenticated By: Lacy Duverney, M.D.   Mr Brain Wo Contrast  08/13/2012   *RADIOLOGY REPORT*  Clinical Data: Right lower extremity weakness. .  MRI HEAD WITHOUT CONTRAST  Technique:  Multiplanar, multiecho pulse sequences of the brain and surrounding structures were obtained  according to standard protocol without intravenous contrast.  Comparison: 08/13/2012 CT.  08/25/2009 MR.  Findings: Subtle small acute non hemorrhagic infarct posterior left corona radiata extending to the posterior limb of the left internal capsule.  Remote bilateral frontal lobe infarcts and right occipital lobe infarct with encephalomalacia.  Prominent small vessel disease type changes.  No intracranial hemorrhage.  Global atrophy without hydrocephalus.  No intracranial mass lesion detected on this unenhanced exam.  Major intracranial vascular structures are patent.  Left vertebral artery is once again noted to be small.  Mild transverse ligament hypertrophy.  Cervical medullary junction, pituitary region, pineal region and orbital structures unremarkable.  IMPRESSION: Subtle small acute non hemorrhagic infarct posterior left corona radiata extending to the posterior  limb of the left internal capsule.  Please see above.  Critical Value/emergent results were called by telephone at the time of interpretation on 08/13/2012 at 6:00 p.m. to Dr. Manus Gunning, who verbally acknowledged these results.   Original Report Authenticated By: Lacy Duverney, M.D.   Mr Lumbar Spine Wo Contrast  08/13/2012   *RADIOLOGY REPORT*  Clinical Data:  79 year old male right side weakness.  Right lower extremity heaviness and weakness beginning today.  MRI LUMBAR SPINE WITHOUT CONTRAST  Technique:  Multiplanar and multiecho pulse sequences of the lumbar spine were obtained without intravenous contrast.  Comparison: 08/25/2009 abdominal radiographs.  Findings: Normal lumbar segmentation on comparison.  Vertebral height and alignment within normal limits.  Occasional mild endplate marrow edema appears to be degenerative. Visualized paraspinal soft tissues are within normal limits.  No acute osseous abnormality identified.   Visualized lower thoracic spinal cord is normal with conus medularis at L1.  Negative visualized abdominal viscera.   Sigmoid diverticulosis.  T10-T11:  Negative.  T11-T12:  Negative.  T12-L1:  Negative.  L1-L2:  Moderate to severe disc space loss.  Anterior eccentric circumferential disc osteophyte complex.  No stenosis.  L2-L3:  Moderate to severe disc space loss.  Anterior eccentric circumferential disc osteophyte complex.  Mild facet hypertrophy. No significant stenosis.  L3-L4:  Moderate disc space loss.  Right eccentric circumferential disc osteophyte complex.  Mild to moderate facet and ligament flavum hypertrophy.  Mild right lateral recess stenosis. Borderline to mild spinal stenosis and bilateral L3 foraminal stenosis.  L4-L5:  Mild to moderate circumferential disc osteophyte complex. Mild facet and ligament flavum hypertrophy.  Mild right L4 foraminal stenosis.  L5-S1:  Bulky right far lateral disc osteophyte complex.  Mild facet hypertrophy.  No spinal or lateral recess stenosis.  Mild left and mild to moderate right L5 foraminal stenosis.  IMPRESSION: 1.  Multilevel lumbar disc and endplate degeneration, but only borderline to mild spinal stenosis at the L3-L4 level occurs. 2.  Intermittent predominately mild lumbar neural foraminal stenosis.  There is mild to moderate right L5 foraminal stenosis.  3.  Occasional mild endplate marrow edema, appears to be degenerative in nature.   Original Report Authenticated By: Erskine Speed, M.D.    Assessment/Plan Principal Problem:   Stroke Active Problems:   HYPERTENSION   TRANSIENT ISCHEMIC ATTACKS, HX OF   HEART VALVE REPLACEMENT, HX OF   DM (diabetes mellitus)   CAD (coronary artery disease) of artery bypass graft   Stroke: -Acute stroke on MRI -Neuro consulted through the ED -For now, will continue plavix as per home regimen -Will consult PT/OT/SLP -Neurochecks scheduled -Will repeat carotid dopplers -2D echo done on 07/29/12, therefore will not repeat  HTN: -Allow permissive hypertension for now -Stable  DM: -Recent a1c of 5.9 -Cont on  SSI  CAD: -Stable -Cont on tele  Code Status: Full (must indicate code status--if unknown or must be presumed, indicate so) Family Communication: Pt in room (indicate person spoken with, if applicable, with phone number if by telephone) Disposition Plan: Pending (indicate anticipated LOS)  Time spent:  CHIU, STEPHEN K [facility] Pager [phone]  If 7PM-7AM, please contact night-coverage [email or web address] Password [facility] 08/13/2012, 6:41 PM

## 2012-08-14 LAB — COMPREHENSIVE METABOLIC PANEL
ALT: 10 U/L (ref 0–53)
Albumin: 2.6 g/dL — ABNORMAL LOW (ref 3.5–5.2)
Alkaline Phosphatase: 43 U/L (ref 39–117)
BUN: 21 mg/dL (ref 6–23)
Chloride: 107 mEq/L (ref 96–112)
Glucose, Bld: 101 mg/dL — ABNORMAL HIGH (ref 70–99)
Potassium: 3.5 mEq/L (ref 3.5–5.1)
Sodium: 143 mEq/L (ref 135–145)
Total Bilirubin: 0.8 mg/dL (ref 0.3–1.2)

## 2012-08-14 LAB — GLUCOSE, CAPILLARY
Glucose-Capillary: 98 mg/dL (ref 70–99)
Glucose-Capillary: 122 mg/dL — ABNORMAL HIGH (ref 70–99)
Glucose-Capillary: 149 mg/dL — ABNORMAL HIGH (ref 70–99)

## 2012-08-14 LAB — CBC
MCH: 31.1 pg (ref 26.0–34.0)
MCHC: 33.8 g/dL (ref 30.0–36.0)
MCV: 91.9 fL (ref 78.0–100.0)
Platelets: 121 10*3/uL — ABNORMAL LOW (ref 150–400)
RDW: 12.7 % (ref 11.5–15.5)
WBC: 5.6 10*3/uL (ref 4.0–10.5)

## 2012-08-14 MED ORDER — ASPIRIN EC 81 MG PO TBEC
81.0000 mg | DELAYED_RELEASE_TABLET | Freq: Every day | ORAL | Status: DC
  Administered 2012-08-14 – 2012-08-16 (×2): 81 mg via ORAL
  Filled 2012-08-14 (×3): qty 1

## 2012-08-14 NOTE — Evaluation (Addendum)
Physical Therapy Evaluation Patient Details Name: [name] MRN: [id number] DOB: January 26, 1922 Today's Date: 08/14/2012 Time: 1191-4782 PT Time Calculation (min): 14 min  PT Assessment / Plan / Recommendation History of Present Illness  Pt adm with slurred speech and rt sided weakness.  Pt with small lt CVA.    Clinical Impression  Pt appears to be at baseline with strength and mobility.    PT Assessment  Patent does not need any further PT services    Follow Up Recommendations  HHPT for home safety eval    Does the patient have the potential to tolerate intense rehabilitation      Barriers to Discharge        Equipment Recommendations  Rolling walker with 5" wheels    Recommendations for Other Services     Frequency      Precautions / Restrictions     Pertinent Vitals/Pain N/A      Mobility  Transfers Transfers: Sit to Stand;Stand to Sit Sit to Stand: 6: Modified independent (Device/Increase time);With upper extremity assist Stand to Sit: 6: Modified independent (Device/Increase time);With upper extremity assist;With armrests;To chair/3-in-1 Ambulation/Gait Ambulation/Gait Assistance: 5: Supervision (for IV only) Ambulation Distance (Feet): 200 Feet Assistive device: Rolling walker Ambulation/Gait Assistance Details: Initial verbal cue to stay closer to walker. Gait Pattern: Step-through pattern;Decreased stride length;Trunk flexed Gait velocity: decr Stairs: Yes Stairs Assistance: 6: Modified independent (Device/Increase time) Stair Management Technique: Two rails Modified Rankin (Stroke Patients Only) Pre-Morbid Rankin Score: Moderate disability Modified Rankin: Moderate disability    Exercises     PT Diagnosis:    PT Problem List:   PT Treatment Interventions:       PT Goals(Current goals can be found in the care plan section)    Visit Information  History of Present Illness: Pt adm with slurred speech and rt sided weakness.  Pt with small lt  CVA.         Prior Functioning  Home Living Family/patient expects to be discharged to:: Private residence Living Arrangements: Alone Available Help at Discharge: Family (daughter lives next door) Type of Home: House Home Access: Stairs to enter Secretary/administrator of Steps: 2 Entrance Stairs-Rails: Left;Right;Can reach both Home Layout: One level Home Equipment: Cane - single point Prior Function Level of Independence: Independent with assistive device(s) Comments: Amb independently with straight cane. Dominant Hand: Left    Cognition  Cognition Arousal/Alertness: Awake/alert Behavior During Therapy: WFL for tasks assessed/performed Overall Cognitive Status: Within Functional Limits for tasks assessed    Extremity/Trunk Assessment Lower Extremity Assessment Lower Extremity Assessment: Overall WFL for tasks assessed   Balance Balance Balance Assessed: Yes Static Standing Balance Static Standing - Balance Support: Right upper extremity supported;During functional activity Static Standing - Level of Assistance: 6: Modified independent (Device/Increase time)  End of Session PT - End of Session Activity Tolerance: Patient tolerated treatment well Patient left: in chair;with call bell/phone within reach Nurse Communication: Mobility status  GP     [facility] 08/14/2012, 12:47 PM  [facility] PT [phone]

## 2012-08-14 NOTE — Progress Notes (Signed)
Utilization review completed. Joanna Hall, RN, BSN. 

## 2012-08-14 NOTE — Progress Notes (Signed)
Stroke Team Progress Note  HISTORY [name] is an 79 y.o. male Mr. diabetes mellitus, hypertension, hyperlipidemia, previous stroke and carotid stenosis presenting with new onset weakness involving his right side. He was last seen normal at bedtime at about 10:00 last night. He woke up with right-sided weakness which got worse prior to coming to the emergency room. He's been taking aspirin as well as Plavix daily. MRI of his brain showed a small non-hemorrhagic ischemic infarction involving the left corona radiata extending to the posterior limb internal capsule. NIH stroke score was 4.   LSN: 10 PM on 08/12/2012  tPA Given: No: Beyond time window for treatment consideration  MRankin: 2  SUBJECTIVE Right sided weakness persists but has improved. Sitting in chair, just finished walking with PT and using a walker.   OBJECTIVE Most recent Vital Signs: Filed Vitals:   08/14/12 0240 08/14/12 0440 08/14/12 0645 08/14/12 0830  BP: 111/55 126/53 128/53 135/54  Pulse: 69 63 65 65  Temp: 98.2 F (36.8 C) 98.1 F (36.7 C) 98.2 F (36.8 C) 97.8 F (36.6 C)  TempSrc: Oral Oral Oral Oral  Resp: 18 16 18 18   Height:      Weight:      SpO2: 99% 99% 99% 98%   CBG (last 3)   Recent Labs  08/13/12 2119 08/14/12 0020 08/14/12 0447  GLUCAP 101* 99 98    IV Fluid Intake:   . sodium chloride 75 mL/hr at 08/13/12 2217    MEDICATIONS  . sodium chloride   Intravenous STAT  . aspirin EC  81 mg Oral Daily  . atorvastatin  80 mg Oral Daily  . clopidogrel  75 mg Oral Daily  . enoxaparin (LOVENOX) injection  40 mg Subcutaneous Q24H  . furosemide  40 mg Oral QODAY  . insulin aspart  0-15 Units Subcutaneous Q4H  . potassium chloride SA  20 mEq Oral Daily  . pyridOXINE  100 mg Oral Daily  . sodium chloride  3 mL Intravenous Q12H   PRN:    Diet:  DYS 3 Activity: up with assistance DVT Prophylaxis:  lovenox  CLINICALLY SIGNIFICANT STUDIES Basic Metabolic Panel:  Recent Labs Lab  08/13/12 1409 08/13/12 1429 08/13/12 1848 08/14/12 0601  NA 142 143  --  143  K 4.8 4.5  --  3.5  CL 102 103  --  107  CO2 33*  --   --  31  GLUCOSE 128* 129*  --  101*  BUN 24* 25*  --  21  CREATININE 1.24 1.30 1.25 1.08  CALCIUM 9.5  --   --  8.6   Liver Function Tests:  Recent Labs Lab 08/13/12 1409 08/14/12 0601  AST 22 18  ALT 13 10  ALKPHOS 53 43  BILITOT 0.9 0.8  PROT 6.7 5.4*  ALBUMIN 3.2* 2.6*   CBC:  Recent Labs Lab 08/13/12 1409  08/13/12 1848 08/14/12 0601  WBC 9.0  --  7.0 5.6  NEUTROABS 7.4  --   --   --   HGB 13.1  < > 12.7* 11.5*  HCT 38.5*  < > 37.4* 34.0*  MCV 91.7  --  90.8 91.9  PLT 155  --  149* 121*  < > = values in this interval not displayed. Coagulation:  Recent Labs Lab 08/13/12 1409  LABPROT 13.3  INR 1.03   Cardiac Enzymes:  Recent Labs Lab 08/13/12 1408  TROPONINI <0.30   Urinalysis:  Recent Labs Lab 08/13/12 1832  COLORURINE YELLOW  LABSPEC 1.015  PHURINE 7.0  GLUCOSEU NEGATIVE  HGBUR NEGATIVE  BILIRUBINUR NEGATIVE  KETONESUR NEGATIVE  PROTEINUR NEGATIVE  UROBILINOGEN 0.2  NITRITE NEGATIVE  LEUKOCYTESUR NEGATIVE   Lipid Panel    Component Value Date/Time   CHOL 126 07/29/2012 0900   TRIG 101 07/29/2012 0900   HDL 43 07/29/2012 0900   CHOLHDL 2.9 07/29/2012 0900   VLDL 20 07/29/2012 0900   LDLCALC 63 07/29/2012 0900   HgbA1C  Lab Results  Component Value Date   HGBA1C 5.9* 07/29/2012    Urine Drug Screen:     Component Value Date/Time   LABOPIA NONE DETECTED 08/13/2012 1830   COCAINSCRNUR NONE DETECTED 08/13/2012 1830   LABBENZ NONE DETECTED 08/13/2012 1830   AMPHETMU NONE DETECTED 08/13/2012 1830   THCU NONE DETECTED 08/13/2012 1830   LABBARB NONE DETECTED 08/13/2012 1830    Alcohol Level:  Recent Labs Lab 08/13/12 1409  ETH <11    Ct Head Wo Contrast 08/13/2012  Remote infarcts without CT evidence of large acute infarct.  No intracranial hemorrhage.  Atrophy.  Mild mucosal thickening left sphenoid  sinus.      Mr Brain Wo Contrast 08/13/2012  Subtle small acute non hemorrhagic infarct posterior left corona radiata extending to the posterior limb of the left internal capsule. Remote bilateral frontal lobe infarcts and right occipital lobe infarct with encephalomalacia.  Mr Lumbar Spine Wo Contrast 08/13/2012   1.  Multilevel lumbar disc and endplate degeneration, but only borderline to mild spinal stenosis at the L3-L4 level occurs. 2.  Intermittent predominately mild lumbar neural foraminal stenosis.  There is mild to moderate right L5 foraminal stenosis.  3.  Occasional mild endplate marrow edema, appears to be degenerative in nature.   MRA of the brain    2D Echocardiogram   07/29/2012 EF 60% bioprosthetic aortic valve   Carotid Doppler    CXR    EKG  normal sinus rhythm.   Therapy Recommendations   Physical Exam   Neurologic Examination:  Mental Status:  Alert, disoriented to correct age and current month. Speech fluent without evidence of aphasia. Able to follow commands without difficulty.  Cranial Nerves:  II-Visual fields were normal to finger counting.  III/IV/VI-Pupils were equal and reacted. Extraocular movements were full and conjugate.  V/VII-no facial numbness and no facial weakness.  VIII-normal.  X-normal speech and symmetrical palatal movement.  Motor: Right upper extremity mild pronator drift; mild right hip flexor weakness with manual muscle testing; motor exam otherwise unremarkable.  Sensory: Normal throughout.  Deep Tendon Reflexes: 2+ and symmetric.  Plantars: Flexor bilaterally  Cerebellar: Normal finger-to-nose testing on the left; slightly impaired on the right.  Carotid auscultation: Normal   ASSESSMENT Mr. Jonmarc Bodkin Hahn is a 79 y.o. male presenting with right hemiparesis. Imaging confirms a left corona radiata infarct extending to the posterior limb internal capsule. Infarct felt to be embolic secondary to unknown source.  On clopidogrel 75 mg  orally every day prior to admission. Now on clopidogrel 75 mg and baby aspirin 81mg  orally every day for secondary stroke prevention. Patient with resultant right hemiparesis. Work up underway.   Diabetes mellitus, hgb a1c 5.9  Coronary artery disease  Hypertension  Hyperlipidemia LDL 63  Hospital day # 1  TREATMENT/PLAN  Continue aspirin 81 mg orally every day and clopidogrel 75 mg orally every day for secondary stroke prevention.  Carotid doppler pending  Risk factor modification  TEE for am. I called Dana.  Gwendolyn Lima. Manson Passey, PAC, MBA, MHA  [facility] Pager: [phone] 08/14/2012 9:06 AM  I have personally obtained a history, examined the patient, evaluated imaging results, and formulated the assessment and plan of care. I agree with the above. New left post limb int capsule stroke, but chronic bifrontal and right occipital stroke. Needs cardioembolic workup.  Suanne Marker, MD 08/14/2012, 11:08 PM Certified in Neurology, Neurophysiology and Neuroimaging  [facility] [address], [address] [place], [place] [address] [phone]

## 2012-08-14 NOTE — Progress Notes (Signed)
Called [facility] on call to clarify if patient was to remain NPO as he had passed his swallow screen in the ED. Stated to leave NPO and was okay to hold PO medications for the evening.

## 2012-08-14 NOTE — Progress Notes (Signed)
[facility] PROGRESS NOTE  [name] ZOX:[id number] DOB: 1921-07-22 DOA: 08/13/2012 PCP: Leo Grosser, MD  Assessment/Plan: Stroke:  -Neurology following -For now, will continue plavix and 81mg  ASA per Neuro recs  - PT/OT/SLP  -Neurochecks scheduled  -Repeat carotid dopplers pending -2D echo done on 07/29/12, therefore was not ordered  HTN:  -Allow permissive hypertension for now  -Home BP meds held for now -Stable  DM:  -Recent a1c of 5.9  -Cont on SSI  CAD:  -Stable  -Cont on tele  Code Status: Full Family Communication: Pt in room (indicate person spoken with, relationship, and if by phone, the number) Disposition Plan: Pending   Consultants:  Neurology  HPI/Subjective: No complaints  Objective: Filed Vitals:   08/14/12 0017 08/14/12 0240 08/14/12 0440 08/14/12 0645  BP: 109/55 111/55 126/53 128/53  Pulse: 69 69 63 65  Temp: 97.5 F (36.4 C) 98.2 F (36.8 C) 98.1 F (36.7 C) 98.2 F (36.8 C)  TempSrc: Oral Oral Oral Oral  Resp: 18 18 16 18   Height:      Weight:      SpO2: 99% 99% 99% 99%   No intake or output data in the 24 hours ending 08/14/12 0801 Filed Weights   08/13/12 1957  Weight: 68.947 kg (152 lb)    Exam:   General:  Awake, in nad  Cardiovascular: regular, s1, s2  Respiratory: normal resp effort, no wheezing  Abdomen: soft, nondistended  Musculoskeletal: perfused, no clubbing   Data Reviewed: Basic Metabolic Panel:  Recent Labs Lab 08/13/12 1409 08/13/12 1429 08/13/12 1848 08/14/12 0601  NA 142 143  --  143  K 4.8 4.5  --  3.5  CL 102 103  --  107  CO2 33*  --   --  31  GLUCOSE 128* 129*  --  101*  BUN 24* 25*  --  21  CREATININE 1.24 1.30 1.25 1.08  CALCIUM 9.5  --   --  8.6   Liver Function Tests:  Recent Labs Lab 08/13/12 1409 08/14/12 0601  AST 22 18  ALT 13 10  ALKPHOS 53 43  BILITOT 0.9 0.8  PROT 6.7 5.4*  ALBUMIN 3.2* 2.6*   No results found for this basename: LIPASE, AMYLASE,  in  the last 168 hours No results found for this basename: AMMONIA,  in the last 168 hours CBC:  Recent Labs Lab 08/13/12 1409 08/13/12 1429 08/13/12 1848 08/14/12 0601  WBC 9.0  --  7.0 5.6  NEUTROABS 7.4  --   --   --   HGB 13.1 12.9* 12.7* 11.5*  HCT 38.5* 38.0* 37.4* 34.0*  MCV 91.7  --  90.8 91.9  PLT 155  --  149* 121*   Cardiac Enzymes:  Recent Labs Lab 08/13/12 1408  TROPONINI <0.30   BNP (last 3 results) No results found for this basename: PROBNP,  in the last 8760 hours CBG:  Recent Labs Lab 08/13/12 1558 08/13/12 2119 08/14/12 0020 08/14/12 0447  GLUCAP 118* 101* 99 98    Recent Results (from the past 240 hour(s))  MRSA PCR SCREENING     Status: None   Collection Time    08/14/12  3:12 AM      Result Value Range Status   MRSA by PCR NEGATIVE  NEGATIVE Final   Comment:            The GeneXpert MRSA Assay (FDA     approved for NASAL specimens     only), is one  component of a     comprehensive MRSA colonization     surveillance program. It is not     intended to diagnose MRSA     infection nor to guide or     monitor treatment for     MRSA infections.     Studies: Ct Head Wo Contrast  08/13/2012   *RADIOLOGY REPORT*  Clinical Data: Right sided heaviness.  History of stroke.  Speech getting worse.  CT HEAD WITHOUT CONTRAST  Technique:  Contiguous axial images were obtained from the base of the skull through the vertex without contrast.  Comparison: 08/25/2009 CT and MR.  Findings: No intracranial hemorrhage.  Remote infarcts frontal lobes bilaterally and right occipital lobe with encephalomalacia.  Remote corona radiata infarcts.  Small vessel disease type changes.  No CT evidence of large acute infarct.  Global atrophy without hydrocephalus.  No intracranial mass lesion detected on this unenhanced exam.  Vascular calcifications.  Mastoid air cells, middle ear cavities and majority paranasal sinuses are clear with exception of mild mucosal thickening left  sphenoid sinus air cell.  IMPRESSION: Remote infarcts without CT evidence of large acute infarct.  No intracranial hemorrhage.  Atrophy.  Mild mucosal thickening left sphenoid sinus.   Original Report Authenticated By: Lacy Duverney, M.D.   Mr Brain Wo Contrast  08/13/2012   *RADIOLOGY REPORT*  Clinical Data: Right lower extremity weakness. .  MRI HEAD WITHOUT CONTRAST  Technique:  Multiplanar, multiecho pulse sequences of the brain and surrounding structures were obtained according to standard protocol without intravenous contrast.  Comparison: 08/13/2012 CT.  08/25/2009 MR.  Findings: Subtle small acute non hemorrhagic infarct posterior left corona radiata extending to the posterior limb of the left internal capsule.  Remote bilateral frontal lobe infarcts and right occipital lobe infarct with encephalomalacia.  Prominent small vessel disease type changes.  No intracranial hemorrhage.  Global atrophy without hydrocephalus.  No intracranial mass lesion detected on this unenhanced exam.  Major intracranial vascular structures are patent.  Left vertebral artery is once again noted to be small.  Mild transverse ligament hypertrophy.  Cervical medullary junction, pituitary region, pineal region and orbital structures unremarkable.  IMPRESSION: Subtle small acute non hemorrhagic infarct posterior left corona radiata extending to the posterior limb of the left internal capsule.  Please see above.  Critical Value/emergent results were called by telephone at the time of interpretation on 08/13/2012 at 6:00 p.m. to Dr. Manus Gunning, who verbally acknowledged these results.   Original Report Authenticated By: Lacy Duverney, M.D.   Mr Lumbar Spine Wo Contrast  08/13/2012   *RADIOLOGY REPORT*  Clinical Data:  79 year old male right side weakness.  Right lower extremity heaviness and weakness beginning today.  MRI LUMBAR SPINE WITHOUT CONTRAST  Technique:  Multiplanar and multiecho pulse sequences of the lumbar spine were  obtained without intravenous contrast.  Comparison: 08/25/2009 abdominal radiographs.  Findings: Normal lumbar segmentation on comparison.  Vertebral height and alignment within normal limits.  Occasional mild endplate marrow edema appears to be degenerative. Visualized paraspinal soft tissues are within normal limits.  No acute osseous abnormality identified.   Visualized lower thoracic spinal cord is normal with conus medularis at L1.  Negative visualized abdominal viscera.  Sigmoid diverticulosis.  T10-T11:  Negative.  T11-T12:  Negative.  T12-L1:  Negative.  L1-L2:  Moderate to severe disc space loss.  Anterior eccentric circumferential disc osteophyte complex.  No stenosis.  L2-L3:  Moderate to severe disc space loss.  Anterior eccentric circumferential disc osteophyte complex.  Mild facet hypertrophy. No significant stenosis.  L3-L4:  Moderate disc space loss.  Right eccentric circumferential disc osteophyte complex.  Mild to moderate facet and ligament flavum hypertrophy.  Mild right lateral recess stenosis. Borderline to mild spinal stenosis and bilateral L3 foraminal stenosis.  L4-L5:  Mild to moderate circumferential disc osteophyte complex. Mild facet and ligament flavum hypertrophy.  Mild right L4 foraminal stenosis.  L5-S1:  Bulky right far lateral disc osteophyte complex.  Mild facet hypertrophy.  No spinal or lateral recess stenosis.  Mild left and mild to moderate right L5 foraminal stenosis.  IMPRESSION: 1.  Multilevel lumbar disc and endplate degeneration, but only borderline to mild spinal stenosis at the L3-L4 level occurs. 2.  Intermittent predominately mild lumbar neural foraminal stenosis.  There is mild to moderate right L5 foraminal stenosis.  3.  Occasional mild endplate marrow edema, appears to be degenerative in nature.   Original Report Authenticated By: Erskine Speed, M.D.    Scheduled Meds: . sodium chloride   Intravenous STAT  . aspirin EC  81 mg Oral Daily  . atorvastatin  80 mg  Oral Daily  . clopidogrel  75 mg Oral Daily  . enoxaparin (LOVENOX) injection  40 mg Subcutaneous Q24H  . furosemide  40 mg Oral QODAY  . insulin aspart  0-15 Units Subcutaneous Q4H  . potassium chloride SA  20 mEq Oral Daily  . pyridOXINE  100 mg Oral Daily  . sodium chloride  3 mL Intravenous Q12H   Continuous Infusions: . sodium chloride 75 mL/hr at 08/13/12 2217    Principal Problem:   Stroke Active Problems:   HYPERTENSION   TRANSIENT ISCHEMIC ATTACKS, HX OF   HEART VALVE REPLACEMENT, HX OF   DM (diabetes mellitus)   CAD (coronary artery disease) of artery bypass graft    Time spent:    Lakin Rhine K  [facility] Pager [phone]. If 7PM-7AM, please contact night-coverage at [email or web address], password [facility] 08/14/2012, 8:01 AM  LOS: 1 day

## 2012-08-14 NOTE — Evaluation (Signed)
Occupational Therapy Evaluation Patient Details Name: [name] MRN: [id number] DOB: 1921-10-12 Today's Date: 08/14/2012 Time: 4098-1191 OT Time Calculation (min): 14 min  OT Assessment / Plan / Recommendation History of present illness Pt adm with slurred speech and rt sided weakness.  Pt with small lt CVA.     Clinical Impression   Pt moving well in session, however when presented with safety questions, pt unable to answer appropriately. Daughter lives near pt but works during the day and not available for 24/7 assist. Recommending HHOT.    OT Assessment  Patient needs continued OT Services    Follow Up Recommendations  Home health OT;Supervision/Assistance - 24 hour    Barriers to Discharge Decreased caregiver support    Equipment Recommendations  None recommended by OT    Recommendations for Other Services Speech consult  Frequency  Min 2X/week    Precautions / Restrictions Restrictions Weight Bearing Restrictions: No   Pertinent Vitals/Pain No pain reported.     ADL  Eating/Feeding: Independent Where Assessed - Eating/Feeding: Chair Upper Body Bathing: Set up;Supervision/safety Where Assessed - Upper Body Bathing: Supported sitting Lower Body Bathing: Supervision/safety Where Assessed - Lower Body Bathing: Unsupported sit to stand Upper Body Dressing: Supervision/safety;Set up Where Assessed - Upper Body Dressing: Supported sitting Lower Body Dressing: Supervision/safety Where Assessed - Lower Body Dressing: Unsupported sit to stand Toilet Transfer: Simulated;Supervision/safety Toilet Transfer Method: Sit to Barista: Other (comment) (from chair without arms) Toileting - Clothing Manipulation and Hygiene: Supervision/safety Where Assessed - Engineer, mining and Hygiene: Standing Tub/Shower Transfer: Performed;Minimal assistance Tub/Shower Transfer Method: Science writer: Shower seat with  back Equipment Used: Gait belt;Rolling walker Transfers/Ambulation Related to ADLs: Supervision level. Mod I for sit <> stand from chair without arms and Mod I to sit in recliner chair ADL Comments: Pt unable to answer safety awareness questions appropriately. Pt also having speech difficulty.   Min A for hand held assist when getting in tub. Pt later told OT he only sponge bathes. Pt performing hygiene at sink when OT arrived. Pt at supervision level while standing supported.     OT Diagnosis: Generalized weakness;Cognitive deficits  OT Problem List: Decreased strength;Decreased cognition;Decreased safety awareness OT Treatment Interventions: Self-care/ADL training;Therapeutic activities;DME and/or AE instruction;Patient/family education;Cognitive remediation/compensation   OT Goals(Current goals can be found in the care plan section) Acute Rehab OT Goals OT Goal Formulation: With patient Time For Goal Achievement: 08/21/12 Potential to Achieve Goals: Good  Visit Information  Last OT Received On: 08/14/12 Assistance Needed: +1 PT/OT Co-Evaluation/Treatment: Yes History of Present Illness: Pt adm with slurred speech and rt sided weakness.  Pt with small lt CVA.         Prior Functioning     Home Living Family/patient expects to be discharged to:: Private residence Living Arrangements: Alone Available Help at Discharge: Family (daughter lives next door) Type of Home: House Home Access: Stairs to enter Secretary/administrator of Steps: 2 Entrance Stairs-Rails: Left;Right;Can reach both Home Layout: One level Home Equipment: Cane - single point;Bedside commode;Shower seat Prior Function Level of Independence: Needs assistance ADL's / Homemaking Assistance Needed: Pt receives meals from mobile meals and daugthers clean house for pt Comments: Amb independently with straight cane. Communication Communication: Expressive difficulties Dominant Hand: Left          Vision/Perception Vision - History Baseline Vision: Wears glasses all the time Patient Visual Report: No change from baseline   Cognition  Cognition Arousal/Alertness: Awake/alert Behavior During Therapy: [facility] for  tasks assessed/performed Overall Cognitive Status: Impaired/Different from baseline Area of Impairment: Safety/judgement General Comments: unable to answer safety questions appropriately.    Extremity/Trunk Assessment Upper Extremity Assessment Upper Extremity Assessment: Generalized weakness     Mobility Bed Mobility Bed Mobility: Not assessed Transfers Transfers: Sit to Stand;Stand to Sit Sit to Stand: With upper extremity assist;From chair/3-in-1;5: Supervision (from chair without arms) Stand to Sit: 6: Modified independent (Device/Increase time);With upper extremity assist;With armrests;To chair/3-in-1;5: Supervision (supervision to sit on chair without arms)     Exercise        End of Session OT - End of Session Equipment Utilized During Treatment: Gait belt;Rolling walker Activity Tolerance: Patient tolerated treatment well Patient left: in chair;with call bell/phone within reach  [employer] OTR/L [phone] 08/14/2012, 3:49 PM

## 2012-08-14 NOTE — Evaluation (Signed)
Clinical/Bedside Swallow Evaluation Patient Details  Name: [name] MRN: [id number] Date of Birth: 02-Apr-1921  Today's Date: 08/14/2012 Time: 2952-8413 SLP Time Calculation (min): 35 min  Past Medical History:  Past Medical History  Diagnosis Date  . Diabetes mellitus   . Stroke   . Cataract   . CAD (coronary artery disease)   . Carotid stenosis 09/24/11    right bulb/proximal ICA demonstrates a mild amont of fibrous plaque elevating velocitieswithin the proximal and mid segments of the internal carotid artery. Left CEA demonstrats a trace amount of fibrous plaque with  no evidence of a significant diameter reduction, tortuosity or any other vascular abnormality.  . Hypertension 08/28/09    ECHO- EF 50-55%  . Hyperlipidemia 06/02/08    Nuclear stress test- high risk scan EF 29% Post stress LV cavity enlargement suggests multi vessel disease.   Past Surgical History:  Past Surgical History  Procedure Laterality Date  . Appendectomy    . Coronary artery bypass graft      + AV replacement  . Carpal tunnel release      Left  . Carotid endarterectomy      with darcon patch angioplasty 03/17/2001  . Cardiac catheterization  06/10/2008   HPI:  79 year old male with hx CVA and DM admitted 08/13/12 due to right sided weakness and slurred speech, swallowing difficulty and expressive difficulty.  MRI indicated small acute infarct in the left posterior corona radiata, extending to the posterior limb of the internal capsule.     Assessment / Plan / Recommendation Clinical Impression  Pt exhibits slight right facial weakness, however, speech is fully intelligible, and no anterior leakage was observed or reported.  Oral motor assessment was otherwise unremarkable.  Pt exhibits no overt s/s aspiration with any consistency tested.  Will begin dys 3 (soft) diet with thin liquids, as his upper dentures are not present at this time.  May advance to regular when/if daughter brings upper dentures  in.    Aspiration Risk  Mild    Diet Recommendation Dysphagia 3 (Mechanical Soft);Thin liquid   Liquid Administration via: Cup;Straw Medication Administration: Whole meds with liquid Supervision: Patient able to self feed Compensations: Slow rate;Small sips/bites Postural Changes and/or Swallow Maneuvers: Seated upright 90 degrees    Other  Recommendations Oral Care Recommendations: Oral care BID   Follow Up Recommendations  None    Frequency and Duration        Pertinent Vitals/Pain None reported   Swallow Study Prior Functional Status   Pt reported tolerating regular diet and thin liquids at home.    General Date of Onset: 08/13/12 HPI: 80 year old male with hx CVA and DM admitted 08/13/12 due to right sided weakness and slurred speech, swallowing difficulty and expressive difficulty.  MRI indicated small acute infarct in the left posterior corona radiata, extending to the posterior limb of the internal capsule.   Type of Study: Bedside swallow evaluation Diet Prior to this Study: NPO Temperature Spikes Noted: No Respiratory Status: Supplemental O2 delivered via (comment) ([name]@1 .5L) History of Recent Intubation: No Behavior/Cognition: Alert;Cooperative;Pleasant mood Oral Cavity - Dentition: Dentures, not available (lower teeth present, upper not available.) Self-Feeding Abilities: Able to feed self Patient Positioning: Upright in bed Baseline Vocal Quality: Clear Volitional Cough: Weak Volitional Swallow: Able to elicit    Oral/Motor/Sensory Function Labial ROM: Within Functional Limits Labial Symmetry: Abnormal symmetry right Labial Strength: Within Functional Limits Labial Sensation: Within Functional Limits Lingual ROM: Within Functional Limits Lingual Symmetry: Within  Functional Limits Lingual Strength: Within Functional Limits Lingual Sensation: Within Functional Limits Facial ROM: Within Functional Limits Facial Symmetry: Right droop Facial Strength: Within  Functional Limits Facial Sensation: Within Functional Limits Velum: Within Functional Limits Mandible: Within Functional Limits   Ice Chips     Thin Liquid Thin Liquid: Within functional limits Presentation: Straw;Cup;Self Fed    Nectar Thick Nectar Thick Liquid: Not tested   Honey Thick Honey Thick Liquid: Not tested   Puree Puree: Within functional limits Presentation: Spoon;Self Fed   Solid   [name] B. Murvin Natal [facility], CCC-SLP [phone]    Solid: Within functional limits Presentation: Self Fed       Murvin Natal, Ruffin Pyo 08/14/2012,10:36 AM

## 2012-08-15 ENCOUNTER — Encounter (HOSPITAL_COMMUNITY)
Admission: EM | Disposition: A | Discharge status: Home Health | Payer: Self-pay | Source: Home / Self Care | Attending: Internal Medicine

## 2012-08-15 DIAGNOSIS — I6350 Cerebral infarction due to unspecified occlusion or stenosis of unspecified cerebral artery: Secondary | ICD-10-CM

## 2012-08-15 DIAGNOSIS — I6789 Other cerebrovascular disease: Secondary | ICD-10-CM

## 2012-08-15 HISTORY — PX: TEE WITHOUT CARDIOVERSION: SHX5443

## 2012-08-15 LAB — GLUCOSE, CAPILLARY
Glucose-Capillary: 108 mg/dL — ABNORMAL HIGH (ref 70–99)
Glucose-Capillary: 105 mg/dL — ABNORMAL HIGH (ref 70–99)
Glucose-Capillary: 119 mg/dL — ABNORMAL HIGH (ref 70–99)

## 2012-08-15 SURGERY — ECHOCARDIOGRAM, TRANSESOPHAGEAL
Anesthesia: Moderate Sedation

## 2012-08-15 MED ORDER — FENTANYL CITRATE 0.05 MG/ML IJ SOLN
INTRAMUSCULAR | Status: DC | PRN
  Administered 2012-08-15 (×2): 25 ug via INTRAVENOUS

## 2012-08-15 MED ORDER — MIDAZOLAM HCL 10 MG/2ML IJ SOLN
INTRAMUSCULAR | Status: DC | PRN
  Administered 2012-08-15 (×2): 2 mg via INTRAVENOUS

## 2012-08-15 MED ORDER — LIDOCAINE VISCOUS 2 % MT SOLN
OROMUCOSAL | Status: DC | PRN
  Administered 2012-08-15: 10 mL via OROMUCOSAL

## 2012-08-15 MED ORDER — SODIUM CHLORIDE 0.9 % IV SOLN: INTRAVENOUS | Status: DC

## 2012-08-15 NOTE — Progress Notes (Signed)
  Echocardiogram Echocardiogram Transesophageal has been performed.  [name] 08/15/2012, 2:02 PM

## 2012-08-15 NOTE — H&P (View-Only) (Signed)
Stroke Team Progress Note  HISTORY [name] is an 79 y.o. male Mr. diabetes mellitus, hypertension, hyperlipidemia, previous stroke and carotid stenosis presenting with new onset weakness involving his right side. He was last seen normal at bedtime at about 10:00 last night. He woke up with right-sided weakness which got worse prior to coming to the emergency room. He's been taking aspirin as well as Plavix daily. MRI of his brain showed a small non-hemorrhagic ischemic infarction involving the left corona radiata extending to the posterior limb internal capsule. NIH stroke score was 4.   LSN: 10 PM on 08/12/2012  tPA Given: No: Beyond time window for treatment consideration  MRankin: 2  SUBJECTIVE Right sided weakness persists but has improved. Sitting in chair, just finished walking with PT and using a walker.   OBJECTIVE Most recent Vital Signs: Filed Vitals:   08/14/12 0240 08/14/12 0440 08/14/12 0645 08/14/12 0830  BP: 111/55 126/53 128/53 135/54  Pulse: 69 63 65 65  Temp: 98.2 F (36.8 C) 98.1 F (36.7 C) 98.2 F (36.8 C) 97.8 F (36.6 C)  TempSrc: Oral Oral Oral Oral  Resp: 18 16 18 18  Height:      Weight:      SpO2: 99% 99% 99% 98%   CBG (last 3)   Recent Labs  08/13/12 2119 08/14/12 0020 08/14/12 0447  GLUCAP 101* 99 98    IV Fluid Intake:   . sodium chloride 75 mL/hr at 08/13/12 2217    MEDICATIONS  . sodium chloride   Intravenous STAT  . aspirin EC  81 mg Oral Daily  . atorvastatin  80 mg Oral Daily  . clopidogrel  75 mg Oral Daily  . enoxaparin (LOVENOX) injection  40 mg Subcutaneous Q24H  . furosemide  40 mg Oral QODAY  . insulin aspart  0-15 Units Subcutaneous Q4H  . potassium chloride SA  20 mEq Oral Daily  . pyridOXINE  100 mg Oral Daily  . sodium chloride  3 mL Intravenous Q12H   PRN:    Diet:  DYS 3 Activity: up with assistance DVT Prophylaxis:  lovenox  CLINICALLY SIGNIFICANT STUDIES Basic Metabolic Panel:  Recent Labs Lab  08/13/12 1409 08/13/12 1429 08/13/12 1848 08/14/12 0601  NA 142 143  --  143  K 4.8 4.5  --  3.5  CL 102 103  --  107  CO2 33*  --   --  31  GLUCOSE 128* 129*  --  101*  BUN 24* 25*  --  21  CREATININE 1.24 1.30 1.25 1.08  CALCIUM 9.5  --   --  8.6   Liver Function Tests:  Recent Labs Lab 08/13/12 1409 08/14/12 0601  AST 22 18  ALT 13 10  ALKPHOS 53 43  BILITOT 0.9 0.8  PROT 6.7 5.4*  ALBUMIN 3.2* 2.6*   CBC:  Recent Labs Lab 08/13/12 1409  08/13/12 1848 08/14/12 0601  WBC 9.0  --  7.0 5.6  NEUTROABS 7.4  --   --   --   HGB 13.1  < > 12.7* 11.5*  HCT 38.5*  < > 37.4* 34.0*  MCV 91.7  --  90.8 91.9  PLT 155  --  149* 121*  < > = values in this interval not displayed. Coagulation:  Recent Labs Lab 08/13/12 1409  LABPROT 13.3  INR 1.03   Cardiac Enzymes:  Recent Labs Lab 08/13/12 1408  TROPONINI <0.30   Urinalysis:  Recent Labs Lab 08/13/12 1832  COLORURINE YELLOW    LABSPEC 1.015  PHURINE 7.0  GLUCOSEU NEGATIVE  HGBUR NEGATIVE  BILIRUBINUR NEGATIVE  KETONESUR NEGATIVE  PROTEINUR NEGATIVE  UROBILINOGEN 0.2  NITRITE NEGATIVE  LEUKOCYTESUR NEGATIVE   Lipid Panel    Component Value Date/Time   CHOL 126 07/29/2012 0900   TRIG 101 07/29/2012 0900   HDL 43 07/29/2012 0900   CHOLHDL 2.9 07/29/2012 0900   VLDL 20 07/29/2012 0900   LDLCALC 63 07/29/2012 0900   HgbA1C  Lab Results  Component Value Date   HGBA1C 5.9* 07/29/2012    Urine Drug Screen:     Component Value Date/Time   LABOPIA NONE DETECTED 08/13/2012 1830   COCAINSCRNUR NONE DETECTED 08/13/2012 1830   LABBENZ NONE DETECTED 08/13/2012 1830   AMPHETMU NONE DETECTED 08/13/2012 1830   THCU NONE DETECTED 08/13/2012 1830   LABBARB NONE DETECTED 08/13/2012 1830    Alcohol Level:  Recent Labs Lab 08/13/12 1409  ETH <11    Ct Head Wo Contrast 08/13/2012  Remote infarcts without CT evidence of large acute infarct.  No intracranial hemorrhage.  Atrophy.  Mild mucosal thickening left sphenoid  sinus.      Mr Brain Wo Contrast 08/13/2012  Subtle small acute non hemorrhagic infarct posterior left corona radiata extending to the posterior limb of the left internal capsule. Remote bilateral frontal lobe infarcts and right occipital lobe infarct with encephalomalacia.  Mr Lumbar Spine Wo Contrast 08/13/2012   1.  Multilevel lumbar disc and endplate degeneration, but only borderline to mild spinal stenosis at the L3-L4 level occurs. 2.  Intermittent predominately mild lumbar neural foraminal stenosis.  There is mild to moderate right L5 foraminal stenosis.  3.  Occasional mild endplate marrow edema, appears to be degenerative in nature.   MRA of the brain    2D Echocardiogram   07/29/2012 EF 60% bioprosthetic aortic valve   Carotid Doppler    CXR    EKG  normal sinus rhythm.   Therapy Recommendations   Physical Exam   Neurologic Examination:  Mental Status:  Alert, disoriented to correct age and current month. Speech fluent without evidence of aphasia. Able to follow commands without difficulty.  Cranial Nerves:  II-Visual fields were normal to finger counting.  III/IV/VI-Pupils were equal and reacted. Extraocular movements were full and conjugate.  V/VII-no facial numbness and no facial weakness.  VIII-normal.  X-normal speech and symmetrical palatal movement.  Motor: Right upper extremity mild pronator drift; mild right hip flexor weakness with manual muscle testing; motor exam otherwise unremarkable.  Sensory: Normal throughout.  Deep Tendon Reflexes: 2+ and symmetric.  Plantars: Flexor bilaterally  Cerebellar: Normal finger-to-nose testing on the left; slightly impaired on the right.  Carotid auscultation: Normal   ASSESSMENT [name] is a 79 y.o. male presenting with right hemiparesis. Imaging confirms a left corona radiata infarct extending to the posterior limb internal capsule. Infarct felt to be embolic secondary to unknown source.  On clopidogrel 75 mg  orally every day prior to admission. Now on clopidogrel 75 mg and baby aspirin 81mg orally every day for secondary stroke prevention. Patient with resultant right hemiparesis. Work up underway.   Diabetes mellitus, hgb a1c 5.9  Coronary artery disease  Hypertension  Hyperlipidemia LDL 63  Hospital day # 1  TREATMENT/PLAN  Continue aspirin 81 mg orally every day and clopidogrel 75 mg orally every day for secondary stroke prevention.  Carotid doppler pending  Risk factor modification  TEE for am. I called [place].  Lynn D. Brown, PAC, MBA, MHA   [facility] Pager: [phone] 08/14/2012 9:06 AM  I have personally obtained a history, examined the patient, evaluated imaging results, and formulated the assessment and plan of care. I agree with the above. New left post limb int capsule stroke, but chronic bifrontal and right occipital stroke. Needs cardioembolic workup.  VIKRAM R. PENUMALLI, MD 08/14/2012, 11:08 PM Certified in Neurology, Neurophysiology and Neuroimaging  [facility] [address], [address] [place], [place] [address] [phone]  

## 2012-08-15 NOTE — Progress Notes (Signed)
Occupational Therapy Treatment Patient Details Name: [name] MRN: [id number] DOB: [birth year] Today's Date: 08/15/2012 Time: 1308-6578 OT Time Calculation (min): 26 min  OT Assessment / Plan / Recommendation  OT comments  Pt performed grooming at sink-cues to bring walker with him. Pt practiced simulated regular height toilet transfer and recommending 3 in 1. Able to answer 1 safety awareness scenario correctly. Daughter states pt has life alert at home and pt states he knows how to use it.   Follow Up Recommendations  Home health OT;Supervision/Assistance - 24 hour    Barriers to Discharge       Equipment Recommendations  3 in 1 bedside comode    Recommendations for Other Services Speech consult  Frequency Min 2X/week   Progress towards OT Goals Progress towards OT goals: Progressing toward goals  Plan Discharge plan remains appropriate    Precautions / Restrictions Restrictions Weight Bearing Restrictions: No   Pertinent Vitals/Pain No pain reported. O2 at 90% after activity. Nurse aware.     ADL  Grooming: Performed;Shaving;Modified independent Where Assessed - Grooming: Supported standing Lower Body Dressing: Performed;Supervision/safety Where Assessed - Lower Body Dressing: Unsupported sit to stand Toilet Transfer: Supervision/safety Toilet Transfer Method: Sit to Barista: Regular height toilet Toileting - Clothing Manipulation and Hygiene: Supervision/safety Where Assessed - Engineer, mining and Hygiene: Standing Equipment Used: Rolling walker Transfers/Ambulation Related to ADLs: Pt ambulated at supervision level.  Cues to bring walker with him to sink when performing grooming tasks.  ADL Comments: Pt donned pants with sit to stand transfer. OT educated pt to be near his bed or have chair behind him with walker in front for balance if needed. Pt practiced simulated regular toilet transfer and relying on UE assist and cues not  to pull on walker. Recommended 3 in 1. Pt able to verbalize 1/2 correct safety awareness scenarios. Daughter states that he has a life alert. Pt reports that he knows how to use this.    OT Diagnosis:    OT Problem List:   OT Treatment Interventions:     OT Goals(current goals can now be found in the care plan section) ADL Goals Pt Will Perform Grooming: with modified independence;standing Pt Will Perform Lower Body Bathing: with modified independence;sit to/from stand Pt Will Perform Lower Body Dressing: with modified independence;sit to/from stand Pt Will Transfer to Toilet: with modified independence;bedside commode Pt Will Perform Toileting - Clothing Manipulation and hygiene: with modified independence;sit to/from stand Additional ADL Goal #1: Pt will independently be able to respond appropriately to safety awareness questions.   Visit Information  Last OT Received On: 08/15/12 Assistance Needed: +1 History of Present Illness: Pt adm with slurred speech and rt sided weakness.  Pt with small lt CVA.      Subjective Data      Prior Functioning       Cognition  Cognition Arousal/Alertness: Awake/alert Behavior During Therapy: WFL for tasks assessed/performed Overall Cognitive Status: Impaired/Different from baseline (unsure if this is baseline?) Area of Impairment: Safety/judgement General Comments: pt able to answer 1/2 safety scenario appropriately, however daughter states he has a life alert button he knows how to use at home.    Mobility  Bed Mobility Bed Mobility: Not assessed Transfers Transfers: Sit to Stand;Stand to Sit Sit to Stand: 5: Supervision;With upper extremity assist;From toilet;6;From chair/3-in-1 Stand to Sit: 5: Supervision;6:With upper extremity assist;To chair/3-in-1;To toilet Details for Transfer Assistance: Supervision to low surface toilet and relying on UE assist-cues not to pull  up on walker-recommended 3 in 1. Cues for hand placement for sit <>  stand from 3 in 1. Cues for hand placement with transfers.    Exercises      Balance     End of Session OT - End of Session Equipment Utilized During Treatment: Rolling walker Activity Tolerance: Patient tolerated treatment well Patient left: in chair;with family/visitor present  GO     [name] OTR/L [phone] 08/15/2012, 11:43 AM

## 2012-08-15 NOTE — Progress Notes (Signed)
*  PRELIMINARY RESULTS* Vascular Ultrasound Carotid Duplex (Doppler) has been completed.  Preliminary findings: Right = 60-79% ICA stenosis. Left = 40-59% ICA stenosis. Antegrade vertebral flow.   Farrel Demark, RDMS, RVT  08/15/2012, 11:02 AM

## 2012-08-15 NOTE — Progress Notes (Signed)
Endo called to give report post TEE.

## 2012-08-15 NOTE — Interval H&P Note (Signed)
History and Physical Interval Note:  08/15/2012 1:13 PM  [name]  has presented today for surgery, with the diagnosis of stroke  The various methods of treatment have been discussed with the patient and family. After consideration of risks, benefits and other options for treatment, the patient has consented to  Procedure(s): TRANSESOPHAGEAL ECHOCARDIOGRAM (TEE) (N/A) as a surgical intervention .  The patient's history has been reviewed, patient examined, no change in status, stable for surgery.  I have reviewed the patient's chart and labs.  Questions were answered to the patient's satisfaction.     Dietrich Pates

## 2012-08-15 NOTE — Op Note (Signed)
Full report to follow 

## 2012-08-15 NOTE — Progress Notes (Signed)
Stroke Team Progress Note  HISTORY [name] is an 79 y.o. male Mr. diabetes mellitus, hypertension, hyperlipidemia, previous stroke and carotid stenosis presenting with new onset weakness involving his right side. He was last seen normal at bedtime at about 10:00 last night. He woke up with right-sided weakness which got worse prior to coming to the emergency room. He's been taking aspirin as well as Plavix daily. MRI of his brain showed a small non-hemorrhagic ischemic infarction involving the left corona radiata extending to the posterior limb internal capsule. NIH stroke score was 4.   LSN: 10 PM on 08/12/2012  tPA Given: No: Beyond time window for treatment consideration  MRankin: 2  SUBJECTIVE Right sided weakness persists but has improved. Sitting in chair, just finished walking with PT and using a walker.   OBJECTIVE Most recent Vital Signs: Filed Vitals:   08/14/12 1745 08/14/12 2214 08/15/12 0204 08/15/12 0552  BP: 160/63 159/65 130/64 134/67  Pulse: 71 72 74 68  Temp: 98.6 F (37 C) 98.5 F (36.9 C) 98 F (36.7 C) 97.8 F (36.6 C)  TempSrc: Oral Oral Oral Oral  Resp: 18 20 20 20   Height:      Weight:      SpO2: 96% 96% 94% 94%   CBG (last 3)   Recent Labs  08/15/12 0017 08/15/12 0414 08/15/12 0741  GLUCAP 108* 102* 108*    IV Fluid Intake:   . sodium chloride 75 mL/hr at 08/14/12 2239    MEDICATIONS  . aspirin EC  81 mg Oral Daily  . atorvastatin  80 mg Oral Daily  . clopidogrel  75 mg Oral Daily  . enoxaparin (LOVENOX) injection  40 mg Subcutaneous Q24H  . furosemide  40 mg Oral QODAY  . insulin aspart  0-15 Units Subcutaneous Q4H  . potassium chloride SA  20 mEq Oral Daily  . pyridOXINE  100 mg Oral Daily  . sodium chloride  3 mL Intravenous Q12H   PRN:    Diet:  DYS 3 Activity: up with assistance DVT Prophylaxis:  lovenox  CLINICALLY SIGNIFICANT STUDIES Basic Metabolic Panel:   Recent Labs Lab 08/13/12 1409 08/13/12 1429  08/13/12 1848 08/14/12 0601  NA 142 143  --  143  K 4.8 4.5  --  3.5  CL 102 103  --  107  CO2 33*  --   --  31  GLUCOSE 128* 129*  --  101*  BUN 24* 25*  --  21  CREATININE 1.24 1.30 1.25 1.08  CALCIUM 9.5  --   --  8.6   Liver Function Tests:   Recent Labs Lab 08/13/12 1409 08/14/12 0601  AST 22 18  ALT 13 10  ALKPHOS 53 43  BILITOT 0.9 0.8  PROT 6.7 5.4*  ALBUMIN 3.2* 2.6*   CBC:  Recent Labs Lab 08/13/12 1409  08/13/12 1848 08/14/12 0601  WBC 9.0  --  7.0 5.6  NEUTROABS 7.4  --   --   --   HGB 13.1  < > 12.7* 11.5*  HCT 38.5*  < > 37.4* 34.0*  MCV 91.7  --  90.8 91.9  PLT 155  --  149* 121*  < > = values in this interval not displayed. Coagulation:   Recent Labs Lab 08/13/12 1409  LABPROT 13.3  INR 1.03   Cardiac Enzymes:   Recent Labs Lab 08/13/12 1408  TROPONINI <0.30   Urinalysis:   Recent Labs Lab 08/13/12 1832  COLORURINE YELLOW  LABSPEC 1.015  PHURINE 7.0  GLUCOSEU NEGATIVE  HGBUR NEGATIVE  BILIRUBINUR NEGATIVE  KETONESUR NEGATIVE  PROTEINUR NEGATIVE  UROBILINOGEN 0.2  NITRITE NEGATIVE  LEUKOCYTESUR NEGATIVE   Lipid Panel    Component Value Date/Time   CHOL 126 07/29/2012 0900   TRIG 101 07/29/2012 0900   HDL 43 07/29/2012 0900   CHOLHDL 2.9 07/29/2012 0900   VLDL 20 07/29/2012 0900   LDLCALC 63 07/29/2012 0900   HgbA1C  Lab Results  Component Value Date   HGBA1C 5.9* 07/29/2012    Urine Drug Screen:     Component Value Date/Time   LABOPIA NONE DETECTED 08/13/2012 1830   COCAINSCRNUR NONE DETECTED 08/13/2012 1830   LABBENZ NONE DETECTED 08/13/2012 1830   AMPHETMU NONE DETECTED 08/13/2012 1830   THCU NONE DETECTED 08/13/2012 1830   LABBARB NONE DETECTED 08/13/2012 1830    Alcohol Level:   Recent Labs Lab 08/13/12 1409  ETH <11    Ct Head Wo Contrast 08/13/2012  Remote infarcts without CT evidence of large acute infarct.  No intracranial hemorrhage.  Atrophy.  Mild mucosal thickening left sphenoid sinus.      Mr Brain  Wo Contrast 08/13/2012  Subtle small acute non hemorrhagic infarct posterior left corona radiata extending to the posterior limb of the left internal capsule. Remote bilateral frontal lobe infarcts and right occipital lobe infarct with encephalomalacia.  Mr Lumbar Spine Wo Contrast 08/13/2012   1.  Multilevel lumbar disc and endplate degeneration, but only borderline to mild spinal stenosis at the L3-L4 level occurs. 2.  Intermittent predominately mild lumbar neural foraminal stenosis.  There is mild to moderate right L5 foraminal stenosis.  3.  Occasional mild endplate marrow edema, appears to be degenerative in nature.   MRA of the brain    2D Echocardiogram   07/29/2012 EF 60% bioprosthetic aortic valve   Carotid Doppler Preliminary findings: Right = 60-79% ICA stenosis. Left = 40-59% ICA stenosis.  CXR    EKG  normal sinus rhythm.   Therapy Recommendations home health  Physical Exam   Neurologic Examination:  Mental Status:  Alert. Speech fluent without evidence of aphasia. Able to follow commands without difficulty.  Cranial Nerves:  II-Visual fields were normal  III/IV/VI-Pupils were equal and reacted. Extraocular movements were full and conjugate.  V/VII-no facial numbness and no facial weakness.  VIII-normal.  X-normal speech and symmetrical palatal movement.  Motor: equal strength, no drift Sensory: Normal throughout.  Deep Tendon Reflexes: 2+ and symmetric.  Cerebellar: Normal finger-to-nose testing on the left; slightly impaired on the right.  Carotid auscultation: Normal   ASSESSMENT [name] is a 80 y.o. male presenting with right hemiparesis. Imaging confirms a left corona radiata infarct extending to the posterior limb internal capsule. Infarct felt to be embolic secondary to unknown source.  On clopidogrel 75 mg orally every day prior to admission. Now on clopidogrel 75 mg and baby aspirin 81mg  orally every day for secondary stroke prevention. Patient with  resultant right hemiparesis. Work up underway.   Diabetes mellitus, hgb a1c 5.9  Coronary artery disease  Hypertension  Hyperlipidemia LDL 63  Hospital day # 2  TREATMENT/PLAN  Continue aspirin 81 mg orally every day and clopidogrel 75 mg orally every day for secondary stroke prevention.  Carotid doppler shows left 40-59% ICA stenosis, given patients advanced age and non-severe stenosis would be conservative with medical management.  Risk factor modification  TEE today (arranged with [place] on 08/15/2012). If positive, patient will need LE dopplers to eval for  DVT as source of emboli. Please schedule outpatient telemetry monitoring to assess patient for atrial fibrillation as source of stroke. May be arranged with patient's cardiologist, or cardiologist of choice.     Gwendolyn Lima. Manson Passey, [facility], MBA, MHA [facility] Pager: [phone] 08/15/2012 8:16 AM  I reviewed note and agree with plan. Patient not in room when I went to see. Follow up TEE results and outpatient cardiac monitoring to evaluate for cardioembolic source. Recommend medical management of left carotid stenosis at this time.   Suanne Marker, MD 08/15/2012, 6:02 PM Certified in Neurology, Neurophysiology and Neuroimaging  [facility] [address] [place], [place] [address] [phone]

## 2012-08-15 NOTE — Progress Notes (Signed)
[facility] PROGRESS NOTE  [name] JXB:[id number] DOB: December 20, 1921 DOA: 08/13/2012 PCP: Leo Grosser, MD  Assessment/Plan: Stroke:  -Neurology following  -For now, will continue plavix and 81mg  ASA per Neuro recs  - PT/OT/SLP  -Neurochecks scheduled  - Carotid dopplers with 60-79% RICA stenosis and 40-59% LICA stenosis -2D echo done on 07/29/12, therefore was not ordered  - s/p TEE with results pending HTN:  -Allow permissive hypertension for now  -Home BP meds held for now  -Stable  DM:  -Recent a1c of 5.9  -Cont on SSI  CAD:  -Stable  -Cont on tele  Code Status: Full Family Communication: Pt and family in room (indicate person spoken with, relationship, and if by phone, the number) Disposition Plan: Pending   Consultants:  Neurology  Procedures:  Carotid dopplers 08/15/12 -  60-79% RICA stenosis and 40-59% LICA stenosis  TEE 08/15/12 - results pending  HPI/Subjective: Feels better today. No complaints. Wants to go home.  Objective: Filed Vitals:   08/15/12 1410 08/15/12 1420 08/15/12 1426 08/15/12 1446  BP: 116/65 114/59 118/64 135/70  Pulse:    82  Temp:    97.9 F (36.6 C)  TempSrc:    Oral  Resp: 30 34 29 28  Height:      Weight:      SpO2: 96% 93% 92% 92%    Intake/Output Summary (Last 24 hours) at 08/15/12 1747 Last data filed at 08/15/12 1400  Gross per 24 hour  Intake    325 ml  Output      0 ml  Net    325 ml   Filed Weights   08/13/12 1957  Weight: 68.947 kg (152 lb)    Exam:   General:  Awake, in nad  Cardiovascular: regular, s1, s2  Respiratory: normal resp effort, no wheezing  Abdomen: soft, nondistended  Musculoskeletal: perfused,no clubbing or cyanosis   Data Reviewed: Basic Metabolic Panel:  Recent Labs Lab 08/13/12 1409 08/13/12 1429 08/13/12 1848 08/14/12 0601  NA 142 143  --  143  K 4.8 4.5  --  3.5  CL 102 103  --  107  CO2 33*  --   --  31  GLUCOSE 128* 129*  --  101*  BUN 24* 25*  --  21   CREATININE 1.24 1.30 1.25 1.08  CALCIUM 9.5  --   --  8.6   Liver Function Tests:  Recent Labs Lab 08/13/12 1409 08/14/12 0601  AST 22 18  ALT 13 10  ALKPHOS 53 43  BILITOT 0.9 0.8  PROT 6.7 5.4*  ALBUMIN 3.2* 2.6*   No results found for this basename: LIPASE, AMYLASE,  in the last 168 hours No results found for this basename: AMMONIA,  in the last 168 hours CBC:  Recent Labs Lab 08/13/12 1409 08/13/12 1429 08/13/12 1848 08/14/12 0601  WBC 9.0  --  7.0 5.6  NEUTROABS 7.4  --   --   --   HGB 13.1 12.9* 12.7* 11.5*  HCT 38.5* 38.0* 37.4* 34.0*  MCV 91.7  --  90.8 91.9  PLT 155  --  149* 121*   Cardiac Enzymes:  Recent Labs Lab 08/13/12 1408  TROPONINI <0.30   BNP (last 3 results) No results found for this basename: PROBNP,  in the last 8760 hours CBG:  Recent Labs Lab 08/15/12 0741 08/15/12 1151 08/15/12 1233 08/15/12 1452 08/15/12 1700  GLUCAP 108* 125* 105* 108* 128*    Recent Results (from the past 240 hour(s))  MRSA PCR SCREENING     Status: None   Collection Time    08/14/12  3:12 AM      Result Value Range Status   MRSA by PCR NEGATIVE  NEGATIVE Final   Comment:            The GeneXpert MRSA Assay (FDA     approved for NASAL specimens     only), is one component of a     comprehensive MRSA colonization     surveillance program. It is not     intended to diagnose MRSA     infection nor to guide or     monitor treatment for     MRSA infections.     Studies: Mr Lumbar Spine Wo Contrast  08/13/2012   *RADIOLOGY REPORT*  Clinical Data:  79 year old male right side weakness.  Right lower extremity heaviness and weakness beginning today.  MRI LUMBAR SPINE WITHOUT CONTRAST  Technique:  Multiplanar and multiecho pulse sequences of the lumbar spine were obtained without intravenous contrast.  Comparison: 08/25/2009 abdominal radiographs.  Findings: Normal lumbar segmentation on comparison.  Vertebral height and alignment within normal limits.   Occasional mild endplate marrow edema appears to be degenerative. Visualized paraspinal soft tissues are within normal limits.  No acute osseous abnormality identified.   Visualized lower thoracic spinal cord is normal with conus medularis at L1.  Negative visualized abdominal viscera.  Sigmoid diverticulosis.  T10-T11:  Negative.  T11-T12:  Negative.  T12-L1:  Negative.  L1-L2:  Moderate to severe disc space loss.  Anterior eccentric circumferential disc osteophyte complex.  No stenosis.  L2-L3:  Moderate to severe disc space loss.  Anterior eccentric circumferential disc osteophyte complex.  Mild facet hypertrophy. No significant stenosis.  L3-L4:  Moderate disc space loss.  Right eccentric circumferential disc osteophyte complex.  Mild to moderate facet and ligament flavum hypertrophy.  Mild right lateral recess stenosis. Borderline to mild spinal stenosis and bilateral L3 foraminal stenosis.  L4-L5:  Mild to moderate circumferential disc osteophyte complex. Mild facet and ligament flavum hypertrophy.  Mild right L4 foraminal stenosis.  L5-S1:  Bulky right far lateral disc osteophyte complex.  Mild facet hypertrophy.  No spinal or lateral recess stenosis.  Mild left and mild to moderate right L5 foraminal stenosis.  IMPRESSION: 1.  Multilevel lumbar disc and endplate degeneration, but only borderline to mild spinal stenosis at the L3-L4 level occurs. 2.  Intermittent predominately mild lumbar neural foraminal stenosis.  There is mild to moderate right L5 foraminal stenosis.  3.  Occasional mild endplate marrow edema, appears to be degenerative in nature.   Original Report Authenticated By: Erskine Speed, M.D.    Scheduled Meds: . aspirin EC  81 mg Oral Daily  . atorvastatin  80 mg Oral Daily  . clopidogrel  75 mg Oral Daily  . enoxaparin (LOVENOX) injection  40 mg Subcutaneous Q24H  . furosemide  40 mg Oral QODAY  . insulin aspart  0-15 Units Subcutaneous Q4H  . potassium chloride SA  20 mEq Oral Daily   . pyridOXINE  100 mg Oral Daily  . sodium chloride  3 mL Intravenous Q12H   Continuous Infusions: . sodium chloride 75 mL/hr at 08/14/12 2239  . sodium chloride      Principal Problem:   Stroke Active Problems:   HYPERTENSION   TRANSIENT ISCHEMIC ATTACKS, HX OF   HEART VALVE REPLACEMENT, HX OF   DM (diabetes mellitus)   CAD (coronary artery disease) of artery bypass  graft    Time spent:    Nobel Brar K  [facility] Pager [phone]. If 7PM-7AM, please contact night-coverage at [email or web address], password [facility] 08/15/2012, 5:47 PM  LOS: 2 days

## 2012-08-16 DIAGNOSIS — I6350 Cerebral infarction due to unspecified occlusion or stenosis of unspecified cerebral artery: Secondary | ICD-10-CM

## 2012-08-16 LAB — GLUCOSE, CAPILLARY
Glucose-Capillary: 119 mg/dL — ABNORMAL HIGH (ref 70–99)
Glucose-Capillary: 87 mg/dL (ref 70–99)

## 2012-08-16 MED ORDER — ASPIRIN 81 MG PO TBEC: 81.0000 mg | DELAYED_RELEASE_TABLET | Freq: Every day | ORAL | Status: DC

## 2012-08-16 NOTE — Discharge Summary (Signed)
Physician Discharge Summary  [name] ZOX:[id number] DOB: 1921-05-04 DOA: 08/13/2012  PCP: Jason Grosser, MD  Admit date: 08/13/2012 Discharge date: 08/16/2012  Time spent: 25 minutes  Recommendations for Outpatient Follow-up:  1. Follow up with PCP in 1-2 weeks 2. Follow up with Dr. Pearlean Brownie in 1 month  Discharge Diagnoses:  Principal Problem:   Stroke Active Problems:   HYPERTENSION   TRANSIENT ISCHEMIC ATTACKS, HX OF   HEART VALVE REPLACEMENT, HX OF   DM (diabetes mellitus)   CAD (coronary artery disease) of artery bypass graft   Discharge Condition: Stable  Diet recommendation: Dysphagia 3 with thin liquids  Filed Weights   08/13/12 1957  Weight: 68.947 kg (152 lb)    History of present illness:  [name] is a 79 y.o. male with a hx of prior cva, cad, DM, htn, and chf who presents to the ED with worsening R sided weakness and slurred speech that started on the morning of admission. Prior to this, the patient had been continued on both ASA and plavix. In the ED, the pt was noted to have an acute infarct of the post L corona radiata. On further questioning, the patient was noted by family to have difficulty swallowing water and also expressive difficulties. No chest pain or SOB.  Hospital Course:  The patient was admitted to the inpatient service where he underwent carotid dopplers which demonstrated a 60-79 RICA and 40-59% LICA stenosis. Given concerns of a possible embolic source, a TEE was obtained which showed no PFO and no intra-atrial thrombus.   Procedures: Carotid dopplers on 08/15/12 - 60-79 RICA and 40-59% LICA stenosis. TEE 08/15/12 - no PFO and no intra-atrial thrombus.  Consultations:  Neurology  Discharge Exam: Filed Vitals:   08/15/12 1800 08/15/12 2200 08/16/12 0200 08/16/12 0600  BP: 178/82 153/69 146/63 145/68  Pulse: 91 76 79 86  Temp: 98.4 F (36.9 C) 97.6 F (36.4 C) 97.8 F (36.6 C) 98 F (36.7 C)  TempSrc: Oral Oral Oral Oral   Resp: 28 22 22 20   Height:      Weight:      SpO2: 92% 95% 96% 97%    General: Awake, in NAD Cardiovascular: Regular, s1, s2 Respiratory: normal resp effort, no wheezing  Discharge Instructions   Future Appointments Provider Department Dept Phone   08/26/2012 3:45 PM Lennette Bihari, MD [facility] [place] [phone]       Medication List    ASK your doctor about these medications       atorvastatin 80 MG tablet  Commonly known as:  LIPITOR  Take 1 tablet (80 mg total) by mouth daily.     benazepril 10 MG tablet  Commonly known as:  LOTENSIN  Take 10 mg by mouth daily.     carvedilol 12.5 MG tablet  Commonly known as:  COREG  Take 1 tablet (12.5 mg total) by mouth 2 (two) times daily with a meal.     clopidogrel 75 MG tablet  Commonly known as:  PLAVIX  Take 1 tablet (75 mg total) by mouth daily.     furosemide 40 MG tablet  Commonly known as:  LASIX  Take 40 mg by mouth daily.     potassium chloride SA 20 MEQ tablet  Commonly known as:  K-DUR,KLOR-CON  Take 20 mEq by mouth daily.     pyridOXINE 100 MG tablet  Commonly known as:  VITAMIN B-6  Take 100 mg by mouth daily.  VITAMIN D (CHOLECALCIFEROL) PO  Take 1,000 Units by mouth daily.       No Known Allergies    The results of significant diagnostics from this hospitalization (including imaging, microbiology, ancillary and laboratory) are listed below for reference.    Significant Diagnostic Studies: Ct Head Wo Contrast  08/13/2012   *RADIOLOGY REPORT*  Clinical Data: Right sided heaviness.  History of stroke.  Speech getting worse.  CT HEAD WITHOUT CONTRAST  Technique:  Contiguous axial images were obtained from the base of the skull through the vertex without contrast.  Comparison: 08/25/2009 CT and MR.  Findings: No intracranial hemorrhage.  Remote infarcts frontal lobes bilaterally and right occipital lobe with encephalomalacia.  Remote corona radiata infarcts.  Small  vessel disease type changes.  No CT evidence of large acute infarct.  Global atrophy without hydrocephalus.  No intracranial mass lesion detected on this unenhanced exam.  Vascular calcifications.  Mastoid air cells, middle ear cavities and majority paranasal sinuses are clear with exception of mild mucosal thickening left sphenoid sinus air cell.  IMPRESSION: Remote infarcts without CT evidence of large acute infarct.  No intracranial hemorrhage.  Atrophy.  Mild mucosal thickening left sphenoid sinus.   Original Report Authenticated By: Lacy Duverney, M.D.   Mr Brain Wo Contrast  08/13/2012   *RADIOLOGY REPORT*  Clinical Data: Right lower extremity weakness. .  MRI HEAD WITHOUT CONTRAST  Technique:  Multiplanar, multiecho pulse sequences of the brain and surrounding structures were obtained according to standard protocol without intravenous contrast.  Comparison: 08/13/2012 CT.  08/25/2009 MR.  Findings: Subtle small acute non hemorrhagic infarct posterior left corona radiata extending to the posterior limb of the left internal capsule.  Remote bilateral frontal lobe infarcts and right occipital lobe infarct with encephalomalacia.  Prominent small vessel disease type changes.  No intracranial hemorrhage.  Global atrophy without hydrocephalus.  No intracranial mass lesion detected on this unenhanced exam.  Major intracranial vascular structures are patent.  Left vertebral artery is once again noted to be small.  Mild transverse ligament hypertrophy.  Cervical medullary junction, pituitary region, pineal region and orbital structures unremarkable.  IMPRESSION: Subtle small acute non hemorrhagic infarct posterior left corona radiata extending to the posterior limb of the left internal capsule.  Please see above.  Critical Value/emergent results were called by telephone at the time of interpretation on 08/13/2012 at 6:00 p.m. to Dr. Manus Gunning, who verbally acknowledged these results.   Original Report Authenticated By:  Lacy Duverney, M.D.   Mr Lumbar Spine Wo Contrast  08/13/2012   *RADIOLOGY REPORT*  Clinical Data:  79 year old male right side weakness.  Right lower extremity heaviness and weakness beginning today.  MRI LUMBAR SPINE WITHOUT CONTRAST  Technique:  Multiplanar and multiecho pulse sequences of the lumbar spine were obtained without intravenous contrast.  Comparison: 08/25/2009 abdominal radiographs.  Findings: Normal lumbar segmentation on comparison.  Vertebral height and alignment within normal limits.  Occasional mild endplate marrow edema appears to be degenerative. Visualized paraspinal soft tissues are within normal limits.  No acute osseous abnormality identified.   Visualized lower thoracic spinal cord is normal with conus medularis at L1.  Negative visualized abdominal viscera.  Sigmoid diverticulosis.  T10-T11:  Negative.  T11-T12:  Negative.  T12-L1:  Negative.  L1-L2:  Moderate to severe disc space loss.  Anterior eccentric circumferential disc osteophyte complex.  No stenosis.  L2-L3:  Moderate to severe disc space loss.  Anterior eccentric circumferential disc osteophyte complex.  Mild facet hypertrophy. No significant stenosis.  L3-L4:  Moderate disc space loss.  Right eccentric circumferential disc osteophyte complex.  Mild to moderate facet and ligament flavum hypertrophy.  Mild right lateral recess stenosis. Borderline to mild spinal stenosis and bilateral L3 foraminal stenosis.  L4-L5:  Mild to moderate circumferential disc osteophyte complex. Mild facet and ligament flavum hypertrophy.  Mild right L4 foraminal stenosis.  L5-S1:  Bulky right far lateral disc osteophyte complex.  Mild facet hypertrophy.  No spinal or lateral recess stenosis.  Mild left and mild to moderate right L5 foraminal stenosis.  IMPRESSION: 1.  Multilevel lumbar disc and endplate degeneration, but only borderline to mild spinal stenosis at the L3-L4 level occurs. 2.  Intermittent predominately mild lumbar neural foraminal  stenosis.  There is mild to moderate right L5 foraminal stenosis.  3.  Occasional mild endplate marrow edema, appears to be degenerative in nature.   Original Report Authenticated By: Erskine Speed, M.D.    Microbiology: Recent Results (from the past 240 hour(s))  MRSA PCR SCREENING     Status: None   Collection Time    08/14/12  3:12 AM      Result Value Range Status   MRSA by PCR NEGATIVE  NEGATIVE Final   Comment:            The GeneXpert MRSA Assay (FDA     approved for NASAL specimens     only), is one component of a     comprehensive MRSA colonization     surveillance program. It is not     intended to diagnose MRSA     infection nor to guide or     monitor treatment for     MRSA infections.     Labs: Basic Metabolic Panel:  Recent Labs Lab 08/13/12 1409 08/13/12 1429 08/13/12 1848 08/14/12 0601  NA 142 143  --  143  K 4.8 4.5  --  3.5  CL 102 103  --  107  CO2 33*  --   --  31  GLUCOSE 128* 129*  --  101*  BUN 24* 25*  --  21  CREATININE 1.24 1.30 1.25 1.08  CALCIUM 9.5  --   --  8.6   Liver Function Tests:  Recent Labs Lab 08/13/12 1409 08/14/12 0601  AST 22 18  ALT 13 10  ALKPHOS 53 43  BILITOT 0.9 0.8  PROT 6.7 5.4*  ALBUMIN 3.2* 2.6*   No results found for this basename: LIPASE, AMYLASE,  in the last 168 hours No results found for this basename: AMMONIA,  in the last 168 hours CBC:  Recent Labs Lab 08/13/12 1409 08/13/12 1429 08/13/12 1848 08/14/12 0601  WBC 9.0  --  7.0 5.6  NEUTROABS 7.4  --   --   --   HGB 13.1 12.9* 12.7* 11.5*  HCT 38.5* 38.0* 37.4* 34.0*  MCV 91.7  --  90.8 91.9  PLT 155  --  149* 121*   Cardiac Enzymes:  Recent Labs Lab 08/13/12 1408  TROPONINI <0.30   BNP: BNP (last 3 results) No results found for this basename: PROBNP,  in the last 8760 hours CBG:  Recent Labs Lab 08/15/12 2001 08/15/12 2135 08/16/12 0004 08/16/12 0353 08/16/12 0739  GLUCAP 119* 119* 119* 87 106*       Signed:  Siddarth Hsiung,  Jonnell Hentges K  [facility] 08/16/2012, 8:02 AM

## 2012-08-16 NOTE — Progress Notes (Signed)
  08/16/12  [name] is an 79 y.o. male presenting with right hemiparesis. Imaging confirmed a left corona radiata infarct extending to the posterior limb internal capsule. Infarct felt to be embolic secondary to unknown source. On clopidogrel 75 mg orally every day prior to admission. Now on clopidogrel 75 mg and baby aspirin 81mg  orally every day for secondary stroke prevention. Patient with resultant right hemiparesis. Work up underway.  TEE  08/15/12  Ejection fraction normal. No PFO noted; although, a small PFO could not be ruled out completely. No evidence of an atrial thrombus.  Carotid Duplex (Doppler)  08/13/12  Preliminary findings: Right = 60-79% ICA stenosis. Left = 40-59% ICA stenosis. Antegrade vertebral flow.   Per Dr Marjory Lies 08/15/12  - "I reviewed note and agree with plan. Patient not in room when I went to see. Follow up TEE results and outpatient cardiac monitoring to evaluate for cardioembolic source. Recommend medical management of left carotid stenosis at this time."  Will sign off at this time. Please call if questions or concerns. F/U Dr Pearlean Brownie 4 weeks.  Delton See PA-C [facility] Pager [phone] 08/16/2012, 8:08 AM

## 2012-08-16 NOTE — Progress Notes (Signed)
   CARE MANAGEMENT NOTE 08/16/2012  Patient:  MAXAMILIAN, AMADON   Account Number:  [id number]  Date Initiated:  08/16/2012  Documentation initiated by:  [facility]  Subjective/Objective Assessment:     Action/Plan:   Anticipated DC Date:  08/16/2012   Anticipated DC Plan:  HOME W HOME HEALTH SERVICES      DC Planning Services  CM consult      [facility] Choice  HOME HEALTH   Choice offered to / List presented to:  C-4 Adult Children        HH arranged  HH-2 PT  HH-3 OT      [facility] agency  [facility]   Status of service:  Completed, signed off Medicare Important Message given?   (If response is "NO", the following Medicare IM given date fields will be blank) Date Medicare IM given:   Date Additional Medicare IM given:    Discharge Disposition:  HOME W HOME HEALTH SERVICES  Per UR Regulation:    If discussed at Long Length of Stay Meetings, dates discussed:    Comments:  08/16/2012 1300 NCM spoke to dtr, Chip Boer. Offered choice for [facility] PT/OT, dtr requested [facility]. Faxed referral. Isidoro Donning RN CM CCM [facility] phone [phone]

## 2012-08-18 ENCOUNTER — Encounter (HOSPITAL_COMMUNITY): Payer: Self-pay | Admitting: Internal Medicine

## 2012-08-26 ENCOUNTER — Ambulatory Visit (INDEPENDENT_AMBULATORY_CARE_PROVIDER_SITE_OTHER): Payer: Medicare Other | Admitting: Cardiovascular Disease

## 2012-08-26 ENCOUNTER — Encounter: Payer: Self-pay | Admitting: Cardiovascular Disease

## 2012-08-26 VITALS — BP 158/82 | Ht 69.0 in | Wt 144.5 lb

## 2012-08-26 DIAGNOSIS — I10 Essential (primary) hypertension: Secondary | ICD-10-CM

## 2012-08-26 DIAGNOSIS — I2510 Atherosclerotic heart disease of native coronary artery without angina pectoris: Secondary | ICD-10-CM

## 2012-08-26 DIAGNOSIS — I6350 Cerebral infarction due to unspecified occlusion or stenosis of unspecified cerebral artery: Secondary | ICD-10-CM

## 2012-08-26 DIAGNOSIS — I639 Cerebral infarction, unspecified: Secondary | ICD-10-CM

## 2012-08-26 DIAGNOSIS — E119 Type 2 diabetes mellitus without complications: Secondary | ICD-10-CM

## 2012-08-26 DIAGNOSIS — E782 Mixed hyperlipidemia: Secondary | ICD-10-CM

## 2012-08-26 DIAGNOSIS — E785 Hyperlipidemia, unspecified: Secondary | ICD-10-CM

## 2012-08-26 NOTE — Patient Instructions (Addendum)
Your physician has recommended you make the following change in your medication: restart your furosemide at 20 mg/day.  Atorvastatin should be 40 mg. ( if not already).  Your physician recommends that you return for lab work fasting in 4 WEEKS.  Your physician recommends that you schedule a follow-up appointment in: 3 MONTHS.

## 2012-08-28 ENCOUNTER — Inpatient Hospital Stay: Payer: Self-pay | Admitting: Family Medicine

## 2012-08-29 ENCOUNTER — Ambulatory Visit (INDEPENDENT_AMBULATORY_CARE_PROVIDER_SITE_OTHER): Payer: Medicare Other | Admitting: Family Medicine

## 2012-08-29 ENCOUNTER — Encounter: Payer: Self-pay | Admitting: Family Medicine

## 2012-08-29 VITALS — BP 130/72 | HR 70 | Temp 98.4°F | Resp 18 | Wt 142.0 lb

## 2012-08-29 DIAGNOSIS — I639 Cerebral infarction, unspecified: Secondary | ICD-10-CM

## 2012-08-29 DIAGNOSIS — E119 Type 2 diabetes mellitus without complications: Secondary | ICD-10-CM

## 2012-08-29 NOTE — Progress Notes (Signed)
Subjective:    Patient ID: [name], male    DOB: 1921/07/16, 79 y.o.   MRN: [id number]  HPI Patient was recently admitted at the end of June do to weakness in his right arm and right leg.  He was found to have a small nonhemorrhagic stroke in the left corona radiata.  MRI and carotid doppler results are below: Carotid doppler- The vertebral arteries appear patent with antegrade flow. - Findings consistent with 60 - 79 percent stenosis involving the right internal carotid artery. - Findings consistent with 40 - 59 percent stenosis involving the left internal carotid artery. - ICA/CCA ratio: right = 2.14. left = 1.51.  MRI-Subtle small acute non hemorrhagic infarct posterior left corona  radiata extending to the posterior limb of the left internal  capsule.  He was discharged home on aspirin 81 mg by mouth daily and Plavix 75 mg by mouth daily. He has made it back to his pre-stroke baseline. He is ambulating throughout the house. He is not using a walker. He is able to rise from a chair or toilet without assistance.  His appetite has declined and he has lost 9 pounds since his last office visit. His random blood sugars at home are all less than 130. His blood pressure is well controlled. He denies any chest pain shortness of breath or dyspnea on exertion. Past Medical History  Diagnosis Date  . Diabetes mellitus   . Stroke   . Cataract   . CAD (coronary artery disease)   . Carotid stenosis 09/24/11    right bulb/proximal ICA demonstrates a mild amont of fibrous plaque elevating velocitieswithin the proximal and mid segments of the internal carotid artery. Left CEA demonstrats a trace amount of fibrous plaque with  no evidence of a significant diameter reduction, tortuosity or any other vascular abnormality.  . Hypertension 08/28/09    ECHO- EF 50-55%  . Hyperlipidemia 06/02/08    Nuclear stress test- high risk scan EF 29% Post stress LV cavity enlargement suggests multi vessel  disease.   Past Surgical History  Procedure Laterality Date  . Appendectomy    . Coronary artery bypass graft      + AV replacement  . Carpal tunnel release      Left  . Carotid endarterectomy      with darcon patch angioplasty 03/17/2001  . Cardiac catheterization  06/10/2008  . Tee without cardioversion N/A 08/15/2012    Procedure: TRANSESOPHAGEAL ECHOCARDIOGRAM (TEE);  Surgeon: Pricilla Riffle, MD;  Location: [facility] ENDOSCOPY;  Service: Cardiovascular;  Laterality: N/A;   Current Outpatient Prescriptions on File Prior to Visit  Medication Sig Dispense Refill  . aspirin EC 81 MG EC tablet Take 1 tablet (81 mg total) by mouth daily.      Marland Kitchen atorvastatin (LIPITOR) 80 MG tablet Take 1 tablet (80 mg total) by mouth daily.  30 tablet  5  . benazepril (LOTENSIN) 10 MG tablet Take 10 mg by mouth 2 (two) times daily.       . carvedilol (COREG) 12.5 MG tablet Take 1 tablet (12.5 mg total) by mouth 2 (two) times daily with a meal.  30 tablet  11  . clopidogrel (PLAVIX) 75 MG tablet Take 1 tablet (75 mg total) by mouth daily.  30 tablet  5  . potassium chloride SA (K-DUR,KLOR-CON) 20 MEQ tablet Take 20 mEq by mouth daily.       Marland Kitchen pyridOXINE (VITAMIN B-6) 100 MG tablet Take 100 mg by mouth daily.      Marland Kitchen  VITAMIN D, CHOLECALCIFEROL, PO Take 1,000 Units by mouth daily.         No current facility-administered medications on file prior to visit.   No Known Allergies History   Social History  . Marital Status: Widowed    Spouse Name: N/A    Number of Children: N/A  . Years of Education: N/A   Occupational History  . Not on file.   Social History Main Topics  . Smoking status: Former Smoker    Quit date: 02/28/1978  . Smokeless tobacco: Never Used  . Alcohol Use: No  . Drug Use: No  . Sexually Active: Not on file   Other Topics Concern  . Not on file   Social History Narrative  . No narrative on file     Review of Systems  All other systems reviewed and are negative.        Objective:   Physical Exam  Vitals reviewed. Constitutional: He is oriented to person, place, and time.  Neck: Neck supple. No thyromegaly present.  Cardiovascular: Normal rate, regular rhythm, normal heart sounds and intact distal pulses.  Exam reveals no gallop and no friction rub.   No murmur heard. Pulmonary/Chest: Effort normal and breath sounds normal. No respiratory distress. He has no wheezes. He has no rales. He exhibits no tenderness.  Abdominal: Soft. Bowel sounds are normal. He exhibits no distension and no mass. There is no tenderness. There is no rebound and no guarding.  Lymphadenopathy:    He has no cervical adenopathy.  Neurological: He is alert and oriented to person, place, and time. No cranial nerve deficit. He exhibits normal muscle tone. Coordination normal.  Skin: Skin is warm and dry. No rash noted. No erythema. No pallor.          Assessment & Plan:  1. Type II or unspecified type diabetes mellitus without mention of complication, not stated as uncontrolled Check a hemoglobin A1c to make sure it is less than 7. This is just to try to prevent any future strokes - Hemoglobin A1c  2. CVA (cerebral infarction) Patient has completely recovered. I will check a CBC to make sure there is not an occult GI bleed on antiplatelet therapy.  Also assess his liver and kidney function while we are drawing bloodwork. - COMPLETE METABOLIC PANEL WITH GFR - CBC with Differential - Hemoglobin A1c

## 2012-08-30 LAB — CBC WITH DIFFERENTIAL/PLATELET
Eosinophils Absolute: 0.2 10*3/uL (ref 0.0–0.7)
Eosinophils Relative: 2 % (ref 0–5)
Hemoglobin: 12.9 g/dL — ABNORMAL LOW (ref 13.0–17.0)
Lymphocytes Relative: 14 % (ref 12–46)
Lymphs Abs: 0.9 10*3/uL (ref 0.7–4.0)
MCHC: 33.4 g/dL (ref 30.0–36.0)
Monocytes Absolute: 0.8 10*3/uL (ref 0.1–1.0)
Monocytes Relative: 12 % (ref 3–12)
Neutro Abs: 4.4 10*3/uL (ref 1.7–7.7)
Neutrophils Relative %: 72 % (ref 43–77)
RDW: 13.5 % (ref 11.5–15.5)
WBC: 6.3 10*3/uL (ref 4.0–10.5)

## 2012-08-30 LAB — COMPLETE METABOLIC PANEL WITH GFR
BUN: 21 mg/dL (ref 6–23)
CO2: 32 mEq/L (ref 19–32)
GFR, Est African American: 59 mL/min — ABNORMAL LOW
GFR, Est Non African American: 51 mL/min — ABNORMAL LOW
Glucose, Bld: 120 mg/dL — ABNORMAL HIGH (ref 70–99)
Sodium: 141 mEq/L (ref 135–145)
Total Bilirubin: 0.4 mg/dL (ref 0.3–1.2)
Total Protein: 6.3 g/dL (ref 6.0–8.3)

## 2012-08-31 ENCOUNTER — Encounter: Payer: Self-pay | Admitting: Cardiovascular Disease

## 2012-08-31 DIAGNOSIS — I2510 Atherosclerotic heart disease of native coronary artery without angina pectoris: Secondary | ICD-10-CM | POA: Insufficient documentation

## 2012-08-31 DIAGNOSIS — E785 Hyperlipidemia, unspecified: Secondary | ICD-10-CM | POA: Insufficient documentation

## 2012-08-31 NOTE — Progress Notes (Signed)
Patient ID: [name], male   DOB: 05-07-1921, 79 y.o.   MRN: [id number]  Patient ID: [name], male   DOB: February 16, 1922, 79 y.o.   MRN: [id number]   HPI: [name], is a 79 y.o. male who is a former patient of Dr. Caprice Kluver and established cardiology care with me on July 21, 2012.  [name] has a history of known coronary as well as valvular disease. In April 2010 was found to have severe coronary artery disease with 95% left main stenosis, segmental 95 and 90% circumflex stenoses, and total occlusion of the proximal RCA with left to right collaterals. I did perform the catheterization at that time and he also had a 22 mm peak to peak gradient. He underwent CABG revascularization surgery with a LIMA to the LAD, SVG to the diagonal, SVG to the circumflex, and an SVG to the PDA and underwent tissue valve replacement for his aortic stenosis. The following year he suffered a CVA in May 2011.  In 2013 he suffered a TIA with almost complete resolution of symptoms. He does have a prior history of left carotid endarterectomy. He last saw Dr. Clarene Duke in July 2013. When I saw him initially he denied any recent chest pain. He did note ankle swelling right greater than left and I adjusted his diuretic therapy.  In review of the chart reveals his last echo was in 2011 and I recommended that he have a  followup echo Doppler study to reassess his valve and LV function. The echo Doppler study was done on 07/29/2012 and showed an ejection fraction in the 55-60% range. The possibility for mild basal posterior hypokinesis could not be excluded. He did have grade 1 diastolic dysfunction. His bioprosthetic aortic valve was well-seated. There was no aortic insufficiency. He did have mild left atrial enlargement and mild pulmonary hypertension with estimated PA pressure 40 mm. Mild to moderate TR.  Apparently, [name]  presented to [facility] emergency room on June 25 and was evaluated by by Dr. Dawson Bills with  complaints of difficulty walking, right leg heaviness, and worsening slurred speech. He was felt to have acute stroke on MRI and neurology was consulted. His Plavix was continued. He was on the service of the hospitalist. A limited subsequent TEE did not show any evidence for left atrial thrombus. There is no PFO by color Doppler, with agitated saline a few bubbles was seen later and a PFO could not be completely excluded. Apparently during his hospitalization he was taken off Lasix. He has noted some recurrence of leg swelling. He denies chest pain. He presents today for cardiology followup evaluation.  Past Medical History  Diagnosis Date  . Diabetes mellitus   . Stroke   . Cataract   . CAD (coronary artery disease)   . Carotid stenosis 09/24/11    right bulb/proximal ICA demonstrates a mild amont of fibrous plaque elevating velocitieswithin the proximal and mid segments of the internal carotid artery. Left CEA demonstrats a trace amount of fibrous plaque with  no evidence of a significant diameter reduction, tortuosity or any other vascular abnormality.  . Hypertension 08/28/09    ECHO- EF 50-55%  . Hyperlipidemia 06/02/08    Nuclear stress test- high risk scan EF 29% Post stress LV cavity enlargement suggests multi vessel disease.    Past Surgical History  Procedure Laterality Date  . Appendectomy    . Coronary artery bypass graft      + AV replacement  .  Carpal tunnel release      Left  . Carotid endarterectomy      with darcon patch angioplasty 03/17/2001  . Cardiac catheterization  06/10/2008  . Tee without cardioversion N/A 08/15/2012    Procedure: TRANSESOPHAGEAL ECHOCARDIOGRAM (TEE);  Surgeon: Pricilla Riffle, MD;  Location: [facility] ENDOSCOPY;  Service: Cardiovascular;  Laterality: N/A;    No Known Allergies  Current Outpatient Prescriptions  Medication Sig Dispense Refill  . aspirin EC 81 MG EC tablet Take 1 tablet (81 mg total) by mouth daily.      Marland Kitchen atorvastatin (LIPITOR) 80 MG  tablet Take 1 tablet (80 mg total) by mouth daily.  30 tablet  5  . benazepril (LOTENSIN) 10 MG tablet Take 10 mg by mouth 2 (two) times daily.       . carvedilol (COREG) 12.5 MG tablet Take 1 tablet (12.5 mg total) by mouth 2 (two) times daily with a meal.  30 tablet  11  . clopidogrel (PLAVIX) 75 MG tablet Take 1 tablet (75 mg total) by mouth daily.  30 tablet  5  . potassium chloride SA (K-DUR,KLOR-CON) 20 MEQ tablet Take 20 mEq by mouth daily.       Marland Kitchen pyridOXINE (VITAMIN B-6) 100 MG tablet Take 100 mg by mouth daily.      Marland Kitchen VITAMIN D, CHOLECALCIFEROL, PO Take 1,000 Units by mouth daily.        . furosemide (LASIX) 20 MG tablet Take 20 mg by mouth.       No current facility-administered medications for this visit.   Social he is widowed he remains relatively active he has 2 children. No alcohol tobacco use.  ROS is negative for fever chills or night sweats. Does admit to leg swelling particularly at the ankles right greater than left. There is a mild shortness of breath. He denies chest pressure. He is unaware of tachycardia palpitations. He denies presyncope or syncope. He denies chest pain. He denies difficulty with speech. He denies bleeding per he denies any orthostatic complaints. He denies myalgias other system review is negative.  PE BP 158/82  Ht 5\' 9"  (1.753 m)  Wt 144 lb 8 oz (65.545 kg)  BMI 21.33 kg/m2  General: Alert, oriented, no distress.  HEENT: Normocephalic, atraumatic. Pupils round and reactive; sclera anicteric;  Nose without nasal septal hypertrophy Mouth/Parynx benign; Mallinpatti scale 3 Neck: No JVD, no carotid briuts Lungs: clear to ausculatation and percussion; no wheezing or rales Heart: RRR, s1 s2 normal with a 2/6 systolic murmur in aortic region with no appreciable aortic insufficiency to Abdomen: soft, nontender; no hepatosplenomehaly, BS+; abdominal aorta nontender and not dilated by palpation. Pulses 2+ Extremities: 1+ bilateral ankle swelling, Homan's  sign negative  Neurologic: grossly nonfocal  ECG: Sinus rhythm at 69 beats per minute with poor progression anteriorly V1 through V3. No ectopy.  LABS:  BMET    Component Value Date/Time   NA 141 08/29/2012 1636   K 4.2 08/29/2012 1636   CL 102 08/29/2012 1636   CO2 32 08/29/2012 1636   GLUCOSE 120* 08/29/2012 1636   BUN 21 08/29/2012 1636   CREATININE 1.25 08/29/2012 1636   CREATININE 1.08 08/14/2012 0601   CALCIUM 9.1 08/29/2012 1636   GFRNONAA 59* 08/14/2012 0601   GFRAA 69* 08/14/2012 0601     Hepatic Function Panel     Component Value Date/Time   PROT 6.3 08/29/2012 1636   ALBUMIN 3.8 08/29/2012 1636   AST 20 08/29/2012 1636   ALT 13 08/29/2012  1636   ALKPHOS 47 08/29/2012 1636   BILITOT 0.4 08/29/2012 1636   BILIDIR 0.2 08/25/2009 0902   IBILI 0.9 08/25/2009 0902     CBC    Component Value Date/Time   WBC 6.3 08/29/2012 1636   RBC 4.22 08/29/2012 1636   HGB 12.9* 08/29/2012 1636   HCT 38.6* 08/29/2012 1636   PLT 201 08/29/2012 1636   MCV 91.5 08/29/2012 1636   MCH 30.6 08/29/2012 1636   MCHC 33.4 08/29/2012 1636   RDW 13.5 08/29/2012 1636   LYMPHSABS 0.9 08/29/2012 1636   MONOABS 0.8 08/29/2012 1636   EOSABS 0.2 08/29/2012 1636   BASOSABS 0.0 08/29/2012 1636     BNP    Component Value Date/Time   PROBNP 365.0* 09/17/2008 1526    Lipid Panel     Component Value Date/Time   CHOL 126 07/29/2012 0900   TRIG 101 07/29/2012 0900   HDL 43 07/29/2012 0900   CHOLHDL 2.9 07/29/2012 0900   VLDL 20 07/29/2012 0900   LDLCALC 63 07/29/2012 0900     RADIOLOGY: No results found.    ASSESSMENT AND PLAN: Mr. Caylor is now 79 years old. He is 4 years that is post CABG surgery as well as tissue aortic valve replacement surgery. He has a history of hyperlipidemia as well as hypertension presently he does have signs of ankle edema as well as blood pressure elevation. Apparently he has been off Lasix therapy since his hospitalization per recommended he resume this at 20 mg daily. His echo  Doppler assessment demonstrated a competent bioprosthetic aortic valve. Normal systolic function with grade 1 diastolic dysfunction. On his recent CT scan he was finally found to have a subtle small acute nonhemorrhagic infarct in the posterior left corona radiate extending into the posterior limb of the left internal capsule. There is evidence of remote bilateral frontal lobe infarcts and right occipital lobe infarct with encephalomalacia. A prominent small vessel disease type changes. There is no evidence for intracranial hemorrhage. He is a followup neurologic appointment to see Dr. Pearlean Brownie. He is maintaining sinus rhythm.  He is on aspirin and Plavix. While in the hospital he also had a carotid duplex study which showed 60-79% right internal carotid artery narrowing and 40-59% left internal carotid narrowing. I will see him in the office in several months for followup evaluation    Lennette Bihari, MD, [facility]  08/31/2012 2:36 PM

## 2012-09-23 ENCOUNTER — Other Ambulatory Visit: Payer: Self-pay | Admitting: *Deleted

## 2012-09-23 MED ORDER — FUROSEMIDE 20 MG PO TABS: 20.0000 mg | ORAL_TABLET | Freq: Every day | ORAL | Status: DC

## 2012-09-23 NOTE — Telephone Encounter (Signed)
Rx was sent to pharmacy electronically. 

## 2012-10-08 ENCOUNTER — Other Ambulatory Visit: Payer: Self-pay | Admitting: *Deleted

## 2012-10-08 MED ORDER — POTASSIUM CHLORIDE CRYS ER 20 MEQ PO TBCR
20.0000 meq | EXTENDED_RELEASE_TABLET | Freq: Every day | ORAL | Status: DC
Start: 2012-10-08 — End: 2013-10-12

## 2012-10-08 NOTE — Telephone Encounter (Signed)
Rx was sent to pharmacy electronically. 

## 2012-11-10 ENCOUNTER — Ambulatory Visit: Payer: Self-pay | Admitting: Neurology

## 2012-11-17 ENCOUNTER — Encounter: Payer: Self-pay | Admitting: Nurse Practitioner

## 2012-11-17 ENCOUNTER — Ambulatory Visit (INDEPENDENT_AMBULATORY_CARE_PROVIDER_SITE_OTHER): Payer: Medicare Other | Admitting: Nurse Practitioner

## 2012-11-17 VITALS — BP 149/76 | HR 76 | Temp 98.0°F | Ht 67.25 in | Wt 147.0 lb

## 2012-11-17 DIAGNOSIS — I10 Essential (primary) hypertension: Secondary | ICD-10-CM

## 2012-11-17 DIAGNOSIS — E119 Type 2 diabetes mellitus without complications: Secondary | ICD-10-CM

## 2012-11-17 DIAGNOSIS — I639 Cerebral infarction, unspecified: Secondary | ICD-10-CM

## 2012-11-17 DIAGNOSIS — I6529 Occlusion and stenosis of unspecified carotid artery: Secondary | ICD-10-CM

## 2012-11-17 DIAGNOSIS — I25810 Atherosclerosis of coronary artery bypass graft(s) without angina pectoris: Secondary | ICD-10-CM

## 2012-11-17 DIAGNOSIS — I6521 Occlusion and stenosis of right carotid artery: Secondary | ICD-10-CM

## 2012-11-17 DIAGNOSIS — I6350 Cerebral infarction due to unspecified occlusion or stenosis of unspecified cerebral artery: Secondary | ICD-10-CM

## 2012-11-17 NOTE — Patient Instructions (Signed)
Continue clopidogrel 75 mg orally every day  for secondary stroke prevention and maintain strict control of hypertension with blood pressure goal below 130/90, diabetes with hemoglobin A1c goal below 6.5% and lipids with LDL cholesterol goal below 100 mg/dL.  Stop daily aspirin. Followup in 3 months, we will order repeat Carotid dopplers and TCD.

## 2012-11-17 NOTE — Progress Notes (Signed)
[facility]  PATIENT: [name] DOB: 1921/09/18   HISTORY FROM: patient, daughter, chart REASON FOR VISIT: routine follow up  HISTORY OF PRESENT ILLNESS:  [name] is an 79 y.o. male presenting with right hemiparesis on 08/12/12.  Imaging confirmed a left corona radiata infarct extending to the posterior limb internal capsule.  On clopidogrel 75 mg orally every day prior to admission. Now on clopidogrel 75 mg and baby aspirin 81mg  orally every day for secondary stroke prevention. Patient with resultant mild right hemiparesis.   UPDATE 11/17/12 (LL): [name] comes to the office for hospital follow up.  His right-sided weakness has almost resolved, still has weakness in right hand grip and decreased fine motor skills.  He has a bioprosthetic heart valve, heart surgery in 2010.  Prior stroke in May 2011.  Left carotid endarterectomy in 2003.  He was discharged on Plavix and aspirin daily.  Patient denies medication side effects, with moderate bruising.  Complains of bad headache last night, which is unusual for him, no other neuro symptoms noted.  Diabetes well controlled, last hgba1c was 5.9.  BP reportedly runs 130-140 systolic at home, is 149/76 in office today.  REVIEW OF SYSTEMS: Full 14 system review of systems performed and notable only for: constitutional: N/A  cardiovascular: N/A respiratory: N/A endocrine: flushing ear/nose/throat: N/A  Hematology/Lymph: easy bleeding, easy bruising musculoskeletal: N/A skin: N/A genitourinary: N/A Gastrointestinal: N/A allergy/immunology: N/A neurological: N/A sleep: N/A psychiatric: N/A   ALLERGIES: No Known Allergies  HOME MEDICATIONS: Outpatient Prescriptions Prior to Visit  Medication Sig Dispense Refill  . aspirin EC 81 MG EC tablet Take 1 tablet (81 mg total) by mouth daily.      . benazepril (LOTENSIN) 10 MG tablet Take 10 mg by mouth daily.       . carvedilol (COREG) 12.5 MG tablet Take 1  tablet (12.5 mg total) by mouth 2 (two) times daily with a meal.  30 tablet  11  . clopidogrel (PLAVIX) 75 MG tablet Take 1 tablet (75 mg total) by mouth daily.  30 tablet  5  . potassium chloride SA (K-DUR,KLOR-CON) 20 MEQ tablet Take 1 tablet (20 mEq total) by mouth daily.  30 tablet  11  . pyridOXINE (VITAMIN B-6) 100 MG tablet Take 100 mg by mouth daily.      Marland Kitchen VITAMIN D, CHOLECALCIFEROL, PO Take 1,000 Units by mouth daily.        . furosemide (LASIX) 20 MG tablet Take 1 tablet (20 mg total) by mouth daily.  30 tablet  11  . atorvastatin (LIPITOR) 80 MG tablet Take 1 tablet (80 mg total) by mouth daily.  30 tablet  5   No facility-administered medications prior to visit.    PAST MEDICAL HISTORY: Past Medical History  Diagnosis Date  . Diabetes mellitus   . Stroke   . Cataract   . CAD (coronary artery disease)   . Carotid stenosis 09/24/11    right bulb/proximal ICA demonstrates a mild amont of fibrous plaque elevating velocitieswithin the proximal and mid segments of the internal carotid artery. Left CEA demonstrats a trace amount of fibrous plaque with  no evidence of a significant diameter reduction, tortuosity or any other vascular abnormality.  . Hypertension 08/28/09    ECHO- EF 50-55%  . Hyperlipidemia 06/02/08    Nuclear stress test- high risk scan EF 29% Post stress LV cavity enlargement suggests multi vessel disease.    PAST SURGICAL HISTORY: Past Surgical History  Procedure Laterality Date  . Appendectomy    . Coronary artery bypass graft      + AV replacement  . Carpal tunnel release      Left  . Carotid endarterectomy      with darcon patch angioplasty 03/17/2001  . Cardiac catheterization  06/10/2008  . Tee without cardioversion N/A 08/15/2012    Procedure: TRANSESOPHAGEAL ECHOCARDIOGRAM (TEE);  Surgeon: Pricilla Riffle, MD;  Location: [facility] ENDOSCOPY;  Service: Cardiovascular;  Laterality: N/A;    FAMILY HISTORY: No family history on file.  SOCIAL  HISTORY: History   Social History  . Marital Status: Widowed    Spouse Name: N/A    Number of Children: 2  . Years of Education: 10   Occupational History  . retired    Social History Main Topics  . Smoking status: Former Smoker    Quit date: 02/28/1978  . Smokeless tobacco: Never Used  . Alcohol Use: No  . Drug Use: No  . Sexual Activity: Not on file   Other Topics Concern  . Not on file   Social History Narrative  . No narrative on file     PHYSICAL EXAM  Filed Vitals:   11/17/12 1537  BP: 149/76  Pulse: 76  Temp: 98 F (36.7 C)  TempSrc: Oral  Height: 5' 7.25" (1.708 m)  Weight: 147 lb (66.679 kg)   Body mass index is 22.86 kg/(m^2).  Generalized: In no acute distress, pleasant elderly Caucasian male  Neck: Supple, Soft carotid bruits   Cardiac: Regular rate rhythm, no murmur   Pulmonary: Clear to auscultation bilaterally   Musculoskeletal: No deformity   Neurological examination   Mentation: Alert oriented to time, place, history taking, language fluent, and casual conversation  Cranial nerve II-XII: Pupils were equal round reactive to light extraocular movements were full, visual field were full on confrontational test. facial sensation and strength were normal. hearing was intact to finger rubbing bilaterally. Uvula tongue midline. head turning and shoulder shrug and were normal and symmetric.Tongue protrusion into cheek strength was normal. MOTOR: normal bulk and tone, full strength in the BUE, BLE, fine finger movements decreased on right, no pronator drift, weaker grip on right. SENSORY: normal and symmetric to light touch COORDINATION: finger-nose-finger, heel-to-shin bilaterally, there was no truncal ataxia REFLEXES: Symmetric and 1+ GAIT/STATION: Rising up from seated position without assistance, normal stance, without trunk ataxia, moderate stride, good arm swing, smooth turning, unable to perform tiptoe, and heel walking without difficulty.     DIAGNOSTIC DATA (LABS, IMAGING, TESTING) - I reviewed patient records, labs, notes, testing and imaging myself where available.  Lab Results  Component Value Date   WBC 6.3 08/29/2012   HGB 12.9* 08/29/2012   HCT 38.6* 08/29/2012   MCV 91.5 08/29/2012   PLT 201 08/29/2012      Component Value Date/Time   NA 141 08/29/2012 1636   K 4.2 08/29/2012 1636   CL 102 08/29/2012 1636   CO2 32 08/29/2012 1636   GLUCOSE 120* 08/29/2012 1636   BUN 21 08/29/2012 1636   CREATININE 1.25 08/29/2012 1636   CREATININE 1.08 08/14/2012 0601   CALCIUM 9.1 08/29/2012 1636   PROT 6.3 08/29/2012 1636   ALBUMIN 3.8 08/29/2012 1636   AST 20 08/29/2012 1636   ALT 13 08/29/2012 1636   ALKPHOS 47 08/29/2012 1636   BILITOT 0.4 08/29/2012 1636   GFRNONAA 59* 08/14/2012 0601   GFRAA 69* 08/14/2012 0601   Lab Results  Component Value Date  CHOL 126 07/29/2012   HDL 43 07/29/2012   LDLCALC 63 07/29/2012   TRIG 101 07/29/2012   CHOLHDL 2.9 07/29/2012   Lab Results  Component Value Date   HGBA1C 5.8* 08/29/2012   No results found for this basename: [id number]   Lab Results  Component Value Date   TSH 1.255 07/29/2012    Ct Head Wo Contrast  08/13/2012 Remote infarcts without CT evidence of large acute infarct. No intracranial hemorrhage. Atrophy. Mild mucosal thickening left sphenoid sinus.  Mr Brain Wo Contrast  08/13/2012 Subtle small acute non hemorrhagic infarct posterior left corona radiata extending to the posterior limb of the left internal capsule. Remote bilateral frontal lobe infarcts and right occipital lobe infarct with encephalomalacia.  Mr Lumbar Spine Wo Contrast  08/13/2012 1. Multilevel lumbar disc and endplate degeneration, but only borderline to mild spinal stenosis at the L3-L4 level occurs. 2. Intermittent predominately mild lumbar neural foraminal stenosis. There is mild to moderate right L5 foraminal stenosis. 3. Occasional mild endplate marrow edema, appears to be degenerative in nature.  2D  Echocardiogram  07/29/2012 EF 60% bioprosthetic aortic valve  Carotid Doppler 08/15/12  Preliminary findings: Right = 60-79% ICA stenosis. Left = 40-59% ICA stenosis. TEE 08/15/12 No PFO by color doppler With injection ofagitated saline there are few bubbles later, consistent withintrapulmonary shunt, cannot completely exclude tiny PFO.  ASSESSMENT AND PLAN Mr. Jomo Forand Herrle is a 79 y.o. male with left corona radiata infarct extending to the posterior limb internal capsule. Infarct felt to be secondary to small vessel disease. Patient with resultant mild right weakness in right hand.  Continue clopidogrel 75 mg orally every day  for secondary stroke prevention and maintain strict control of hypertension with blood pressure goal below 130/90, diabetes with hemoglobin A1c goal below 6.5% and lipids with LDL cholesterol goal below 100 mg/dL.  Stop daily aspirin. Followup in 3 months, we will order repeat Carotid dopplers and TCD.  Akshay Spang NP-C 11/17/2012, 3:53 PM [facility] [address], [address] [place], [place] [address] [phone]  I have personally examined this patient, reviewed pertinent data, developed plan of care and discussed with patient and agree with above.  Delia Heady, MD

## 2012-11-19 ENCOUNTER — Other Ambulatory Visit: Payer: Self-pay | Admitting: Nurse Practitioner

## 2012-11-19 DIAGNOSIS — I6521 Occlusion and stenosis of right carotid artery: Secondary | ICD-10-CM

## 2012-11-19 DIAGNOSIS — I639 Cerebral infarction, unspecified: Secondary | ICD-10-CM

## 2012-11-20 ENCOUNTER — Other Ambulatory Visit: Payer: Self-pay | Admitting: Neurology

## 2012-11-21 ENCOUNTER — Ambulatory Visit (INDEPENDENT_AMBULATORY_CARE_PROVIDER_SITE_OTHER): Payer: Medicare Other | Admitting: Family Medicine

## 2012-11-21 DIAGNOSIS — Z23 Encounter for immunization: Secondary | ICD-10-CM

## 2012-11-25 ENCOUNTER — Other Ambulatory Visit: Payer: Self-pay | Admitting: Neurology

## 2012-12-16 ENCOUNTER — Ambulatory Visit (INDEPENDENT_AMBULATORY_CARE_PROVIDER_SITE_OTHER): Payer: Medicare Other

## 2012-12-16 VITALS — BP 172/83 | HR 68 | Resp 32 | Ht 68.0 in | Wt 150.0 lb

## 2012-12-16 DIAGNOSIS — L600 Ingrowing nail: Secondary | ICD-10-CM

## 2012-12-16 NOTE — Patient Instructions (Signed)

## 2012-12-16 NOTE — Progress Notes (Signed)
  Subjective:    Patient ID: [name], male    DOB: 1922-02-07, 79 y.o.   MRN: [id number] "I have an ingrown toenail on my left big toe."   . No complaints of pain or discomfort may have bumped it couple days ago however examination this time reveals no edema erythema no discharge or drainage. They were just cut a month ago.  HPI no changes in health history or meds   Review of Systems deferred at this time     Objective:   Physical Exam Unremarkable pedal pulses are palpable no edema erythema no increased temperature no discharge or drainage from the toes. Patient's family members call to they're concerned he may have had an ingrown nail however exam of this time there is nothing identifiable.       Assessment & Plan:  No abnormal findings noted. No signs of infection no pain or discomfort no edema or erythema patient discharged for followup in appointment in 2-3 months for palliative care with Dr. Al Corpus.  Alvan Dame DP

## 2012-12-21 ENCOUNTER — Other Ambulatory Visit: Payer: Self-pay | Admitting: Family Medicine

## 2013-01-04 ENCOUNTER — Other Ambulatory Visit: Payer: Self-pay | Admitting: Family Medicine

## 2013-01-26 ENCOUNTER — Other Ambulatory Visit: Payer: Self-pay | Admitting: Family Medicine

## 2013-01-26 ENCOUNTER — Other Ambulatory Visit: Payer: Self-pay | Admitting: Cardiovascular Disease

## 2013-01-27 NOTE — Telephone Encounter (Signed)
Rx was sent to pharmacy electronically. 

## 2013-02-04 ENCOUNTER — Ambulatory Visit (INDEPENDENT_AMBULATORY_CARE_PROVIDER_SITE_OTHER): Payer: Medicare Other

## 2013-02-04 DIAGNOSIS — Z0289 Encounter for other administrative examinations: Secondary | ICD-10-CM

## 2013-02-04 DIAGNOSIS — I6529 Occlusion and stenosis of unspecified carotid artery: Secondary | ICD-10-CM

## 2013-02-04 DIAGNOSIS — I10 Essential (primary) hypertension: Secondary | ICD-10-CM

## 2013-02-04 DIAGNOSIS — I639 Cerebral infarction, unspecified: Secondary | ICD-10-CM

## 2013-02-04 DIAGNOSIS — I6350 Cerebral infarction due to unspecified occlusion or stenosis of unspecified cerebral artery: Secondary | ICD-10-CM

## 2013-02-04 DIAGNOSIS — I6521 Occlusion and stenosis of right carotid artery: Secondary | ICD-10-CM

## 2013-02-04 DIAGNOSIS — I25810 Atherosclerosis of coronary artery bypass graft(s) without angina pectoris: Secondary | ICD-10-CM

## 2013-02-09 ENCOUNTER — Other Ambulatory Visit: Payer: Self-pay | Admitting: Family Medicine

## 2013-02-10 ENCOUNTER — Ambulatory Visit (INDEPENDENT_AMBULATORY_CARE_PROVIDER_SITE_OTHER): Payer: Medicare Other | Admitting: Podiatry

## 2013-02-10 ENCOUNTER — Encounter: Payer: Self-pay | Admitting: Podiatry

## 2013-02-10 VITALS — BP 154/73 | HR 73 | Resp 18 | Ht 68.0 in | Wt 150.0 lb

## 2013-02-10 DIAGNOSIS — B351 Tinea unguium: Secondary | ICD-10-CM

## 2013-02-10 DIAGNOSIS — M79609 Pain in unspecified limb: Secondary | ICD-10-CM

## 2013-02-10 NOTE — Progress Notes (Signed)
Pt states it is time to get his toenails trimmed because they're long and painful.  Objective: Pulses are palpable bilateral nails are thick yellow dystrophic onychomycotic and painful palpation.  Assessment: Pain in limb secondary to onychomycosis 1 through 5 bilateral.  Plan: Debridement nails 1 through 5 bilateral is cover service secondary to pain.

## 2013-02-24 ENCOUNTER — Telehealth: Payer: Self-pay | Admitting: Nurse Practitioner

## 2013-02-24 ENCOUNTER — Encounter: Payer: Self-pay | Admitting: Nurse Practitioner

## 2013-02-24 NOTE — Telephone Encounter (Signed)
Error. Please disregard note.

## 2013-03-05 ENCOUNTER — Ambulatory Visit: Payer: Medicare Other | Admitting: Family Medicine

## 2013-03-06 ENCOUNTER — Ambulatory Visit (INDEPENDENT_AMBULATORY_CARE_PROVIDER_SITE_OTHER): Payer: Medicare Other | Admitting: Family Medicine

## 2013-03-06 ENCOUNTER — Encounter: Payer: Self-pay | Admitting: Family Medicine

## 2013-03-06 VITALS — BP 150/84 | HR 72 | Temp 97.1°F | Resp 22 | Wt 145.0 lb

## 2013-03-06 DIAGNOSIS — Z23 Encounter for immunization: Secondary | ICD-10-CM

## 2013-03-06 DIAGNOSIS — M7020 Olecranon bursitis, unspecified elbow: Secondary | ICD-10-CM

## 2013-03-06 DIAGNOSIS — M7022 Olecranon bursitis, left elbow: Secondary | ICD-10-CM

## 2013-03-06 NOTE — Progress Notes (Signed)
Subjective:    Patient ID: [name], male    DOB: 05/19/1921, 80 y.o.   MRN: [id number]  HPI Patient is a very pleasant 80 year old white male who presents with a painless swelling over his left elbow for one week. He denies any specific injury to the elbow. There is no erythema or warmth  to the left elbow. He has no pain with range of motion. He has no fever. He has a noticeablly enlarged olecranon bursa.  He denies any pain. He was just concerned as to what this was. Past Medical History  Diagnosis Date  . Diabetes mellitus   . Stroke   . Cataract   . CAD (coronary artery disease)   . Carotid stenosis 09/24/11    right bulb/proximal ICA demonstrates a mild amont of fibrous plaque elevating velocitieswithin the proximal and mid segments of the internal carotid artery. Left CEA demonstrats a trace amount of fibrous plaque with  no evidence of a significant diameter reduction, tortuosity or any other vascular abnormality.  . Hypertension 08/28/09    ECHO- EF 50-55%  . Hyperlipidemia 06/02/08    Nuclear stress test- high risk scan EF 29% Post stress LV cavity enlargement suggests multi vessel disease.   Current Outpatient Prescriptions on File Prior to Visit  Medication Sig Dispense Refill  . atorvastatin (LIPITOR) 80 MG tablet Take 80 mg by mouth daily. Take a half tablet by mouth daily      . benazepril (LOTENSIN) 10 MG tablet TAKE 1 TABLET BY MOUTH DAILY  30 tablet  1  . benazepril (LOTENSIN) 10 MG tablet TAKE 1 TABLET BY MOUTH DAILY  30 tablet  3  . carvedilol (COREG) 12.5 MG tablet TAKE 1 TABLET BY MOUTH TWICE DAILY WITH MEALS.  60 tablet  7  . clopidogrel (PLAVIX) 75 MG tablet TAKE 1 TABLET BY MOUTH DAILY  30 tablet  5  . furosemide (LASIX) 20 MG tablet Take 20 mg by mouth daily. Take half tablet daily      . potassium chloride SA (K-DUR,KLOR-CON) 20 MEQ tablet Take 1 tablet (20 mEq total) by mouth daily.  30 tablet  11  . pyridOXINE (VITAMIN B-6) 100 MG tablet Take 100 mg by  mouth daily.      Marland Kitchen. VITAMIN D, CHOLECALCIFEROL, PO Take 1,000 Units by mouth daily.         No current facility-administered medications on file prior to visit.   No Known Allergies History   Social History  . Marital Status: Widowed    Spouse Name: N/A    Number of Children: 2  . Years of Education: 10   Occupational History  . retired    Social History Main Topics  . Smoking status: Former Smoker    Quit date: 02/28/1978  . Smokeless tobacco: Never Used  . Alcohol Use: No  . Drug Use: No  . Sexual Activity: Not on file   Other Topics Concern  . Not on file   Social History Narrative  . No narrative on file      Review of Systems  All other systems reviewed and are negative.       Objective:   Physical Exam  Vitals reviewed. Musculoskeletal: Normal range of motion. He exhibits no edema and no tenderness.   patient has a swollen left olecranon bursa. There is no erythema or warmth to indicate infection. He has no pain with range of motion in the left elbow. He is no pain in the left  arm at all.  The overlying skin is normal in color and temperature.        Assessment & Plan:  1. Olecranon bursitis of left elbow Patient is asymptomatic. I told the patient that if we did nothing, the fluid collection would likely persist unchanged.  However I on the patient that this is not dangerous and would most likely cause him no problems.  I also offered to treat by aspirating the fluid collection, and injecting cortisone into the bursa sac to calm any inflammation, and then wrap his entire left elbow in a compressive dressing.  The patient does not want to undergo this procedure and I agree that it is not necessary.  Time being we will clinically monitor only.  I did give the patient prevnar 13 while he was here in clinic.

## 2013-04-15 ENCOUNTER — Ambulatory Visit (INDEPENDENT_AMBULATORY_CARE_PROVIDER_SITE_OTHER): Payer: Medicare Other | Admitting: Family Medicine

## 2013-04-15 VITALS — BP 160/86 | HR 78 | Temp 98.2°F | Resp 18 | Ht 65.0 in | Wt 145.0 lb

## 2013-04-15 DIAGNOSIS — M7020 Olecranon bursitis, unspecified elbow: Secondary | ICD-10-CM

## 2013-04-15 DIAGNOSIS — M7022 Olecranon bursitis, left elbow: Secondary | ICD-10-CM

## 2013-04-15 DIAGNOSIS — R600 Localized edema: Secondary | ICD-10-CM

## 2013-04-15 DIAGNOSIS — I10 Essential (primary) hypertension: Secondary | ICD-10-CM

## 2013-04-15 DIAGNOSIS — R609 Edema, unspecified: Secondary | ICD-10-CM

## 2013-04-15 NOTE — Patient Instructions (Signed)
Continue current medications Bursitis is improved F/U as needed

## 2013-04-17 ENCOUNTER — Encounter: Payer: Self-pay | Admitting: Family Medicine

## 2013-04-17 DIAGNOSIS — R600 Localized edema: Secondary | ICD-10-CM | POA: Insufficient documentation

## 2013-04-17 NOTE — Assessment & Plan Note (Signed)
BP was rechecked, he tends to run on higher end, but I am reluctant to change his BP meds as he does flutuate and does not have any symptoms in the range of 150-160systolic, will have daughter monitor, he also has cardiology

## 2013-04-17 NOTE — Assessment & Plan Note (Signed)
No change the Lasix dose, he is doing fairly well. He does cross his legs which causes more swelling in the right leg compared to the left

## 2013-04-17 NOTE — Progress Notes (Signed)
Patient ID: [name], male   DOB: Apr 11, 1921, 80 y.o.   MRN: [id number]   Subjective:    Patient ID: [name], male    DOB: 1921-06-16, 80 y.o.   MRN: [id number]  Patient presents for 6 week follow up  patient here with his daughter to followup left elbow bursitis. He had acute swelling of his L. decision was made to just monitor this and see if it improves on its own which it did. He's not had any pain in the arm and no further swelling. Disorder will like me to check his feet as well. He tends to cross his legs and often has more swelling in his right ankle compared to his left. He does take Lasix as prescribed. He has no specific concerns today no chest pain no shortness of breath. His appetite is stable.     Review Of Systems:  GEN- denies fatigue, fever, weight loss,weakness, recent illness HEENT- denies eye drainage, change in vision, nasal discharge, CVS- denies chest pain, palpitations RESP- denies SOB, cough, wheeze MSK- denies joint pain, muscle aches, injury Neuro- denies headache, dizziness, syncope, seizure activity       Objective:    BP 160/86  Pulse 78  Temp(Src) 98.2 F (36.8 C) (Oral)  Resp 18  Ht 5\' 5"  (1.651 m)  Wt 145 lb (65.772 kg)  BMI 24.13 kg/m2 GEN- NAD, alert and oriented x3 HEENT- PERRL, EOMI, non injected sclera, pink conjunctiva, MMM, oropharynx clear  CVS- RRR, 3/6 SEM RESP-CTAB MSK- Left elbow normal inspection, no effusion, no warmth, NT, good ROM compared to right EXT- pedal edema R>L Pulses- Radial 2+ , DP diminished bilat        Assessment & Plan:      Problem List Items Addressed This Visit   Pedal edema     No change the Lasix dose, he is doing fairly well. He does cross his legs which causes more swelling in the right leg compared to the left     Other Visit Diagnoses   Olecranon bursitis of left elbow    -  Primary    Swelling has resolved. We'll continue to monitor at this time no further intervention needed       Note: This dictation was prepared with Dragon dictation along with smaller phrase technology. Any transcriptional errors that result from this process are unintentional.

## 2013-05-01 ENCOUNTER — Ambulatory Visit (INDEPENDENT_AMBULATORY_CARE_PROVIDER_SITE_OTHER): Payer: Medicare Other | Admitting: Podiatry

## 2013-05-01 ENCOUNTER — Encounter: Payer: Self-pay | Admitting: Podiatry

## 2013-05-01 VITALS — BP 152/68 | HR 82 | Resp 12

## 2013-05-01 DIAGNOSIS — B351 Tinea unguium: Secondary | ICD-10-CM

## 2013-05-01 DIAGNOSIS — M79609 Pain in unspecified limb: Secondary | ICD-10-CM

## 2013-05-01 NOTE — Progress Notes (Signed)
He presents today chief complaint of painful elongated toenails.  Objective: Vital signs are stable he is alert and oriented x3. Pulses are palpable bilateral. Nails are thick yellow dystrophic with mycotic.  Assessment: Pain in limb secondary to onychomycosis 1 through 5 bilateral.  Plan: Debridement of nails 1 through 5 bilateral covered service secondary to pain.

## 2013-05-05 ENCOUNTER — Ambulatory Visit: Payer: Medicare Other | Admitting: Podiatry

## 2013-06-18 ENCOUNTER — Ambulatory Visit
Admission: RE | Admit: 2013-06-18 | Discharge: 2013-06-18 | Disposition: A | Discharge status: Home / Self Care | Payer: Medicare Other | Source: Ambulatory Visit | Attending: Family Medicine | Admitting: Family Medicine

## 2013-06-18 ENCOUNTER — Encounter: Payer: Self-pay | Admitting: Family Medicine

## 2013-06-18 ENCOUNTER — Ambulatory Visit (INDEPENDENT_AMBULATORY_CARE_PROVIDER_SITE_OTHER): Payer: Medicare Other | Admitting: Family Medicine

## 2013-06-18 VITALS — BP 128/74 | HR 80 | Temp 98.4°F | Resp 22 | Ht 65.0 in | Wt 143.0 lb

## 2013-06-18 DIAGNOSIS — M25579 Pain in unspecified ankle and joints of unspecified foot: Secondary | ICD-10-CM

## 2013-06-18 NOTE — Progress Notes (Signed)
Subjective:    Patient ID: [name], male    DOB: 1921/07/12, 80 y.o.   MRN: [id number]  HPI Patient fell Friday. Ever since he's had pain and swelling in the dorsum of his right foot. He now has +2 pitting edema in the right foot and +1 edema up to the midshin. He is very tender to palpation over the dorsum of the right midfoot particularly over the metatarsal. He has no pain with range of motion in his ankle. He does complain of pain with walking. Past Medical History  Diagnosis Date  . Diabetes mellitus   . Stroke   . Cataract   . CAD (coronary artery disease)   . Carotid stenosis 09/24/11    right bulb/proximal ICA demonstrates a mild amont of fibrous plaque elevating velocitieswithin the proximal and mid segments of the internal carotid artery. Left CEA demonstrats a trace amount of fibrous plaque with  no evidence of a significant diameter reduction, tortuosity or any other vascular abnormality.  . Hypertension 08/28/09    ECHO- EF 50-55%  . Hyperlipidemia 06/02/08    Nuclear stress test- high risk scan EF 29% Post stress LV cavity enlargement suggests multi vessel disease.   Current Outpatient Prescriptions on File Prior to Visit  Medication Sig Dispense Refill  . atorvastatin (LIPITOR) 80 MG tablet Take 80 mg by mouth daily. Take a half tablet by mouth daily      . benazepril (LOTENSIN) 10 MG tablet TAKE 1 TABLET BY MOUTH DAILY  30 tablet  1  . carvedilol (COREG) 12.5 MG tablet TAKE 1 TABLET BY MOUTH TWICE DAILY WITH MEALS.  60 tablet  7  . clopidogrel (PLAVIX) 75 MG tablet TAKE 1 TABLET BY MOUTH DAILY  30 tablet  5  . furosemide (LASIX) 20 MG tablet Take half tablet daily      . potassium chloride SA (K-DUR,KLOR-CON) 20 MEQ tablet Take 1 tablet (20 mEq total) by mouth daily.  30 tablet  11  . pyridOXINE (VITAMIN B-6) 100 MG tablet Take 100 mg by mouth daily.      Marland Kitchen VITAMIN D, CHOLECALCIFEROL, PO Take 1,000 Units by mouth daily.         No current facility-administered  medications on file prior to visit.   No Known Allergies History   Social History  . Marital Status: Widowed    Spouse Name: N/A    Number of Children: 2  . Years of Education: 10   Occupational History  . retired    Social History Main Topics  . Smoking status: Former Smoker    Quit date: 02/28/1978  . Smokeless tobacco: Never Used  . Alcohol Use: No  . Drug Use: No  . Sexual Activity: Not on file   Other Topics Concern  . Not on file   Social History Narrative  . No narrative on file      Review of Systems  All other systems reviewed and are negative.      Objective:   Physical Exam  Vitals reviewed. Cardiovascular: Normal rate and regular rhythm.   Pulmonary/Chest: Effort normal and breath sounds normal.  Musculoskeletal: He exhibits edema.       Right ankle: He exhibits swelling and ecchymosis. He exhibits normal range of motion.       Right foot: He exhibits decreased range of motion, tenderness, bony tenderness and swelling.          Assessment & Plan:  Pain in joint, ankle and foot -  Plan: DG Foot Complete Right, DG Ankle Complete Right  Fracture of 4th MT vs sprain.  Obtain xray to eval further.  Post op shoe for 4 weeks if fractured.  Await x-ray results.

## 2013-06-23 ENCOUNTER — Other Ambulatory Visit: Payer: Self-pay | Admitting: Family Medicine

## 2013-06-23 DIAGNOSIS — S92301A Fracture of unspecified metatarsal bone(s), right foot, initial encounter for closed fracture: Secondary | ICD-10-CM

## 2013-06-24 NOTE — Telephone Encounter (Signed)
Medication refilled per protocol. 

## 2013-07-07 ENCOUNTER — Ambulatory Visit (INDEPENDENT_AMBULATORY_CARE_PROVIDER_SITE_OTHER): Payer: Medicare Other | Admitting: Family Medicine

## 2013-07-07 ENCOUNTER — Encounter: Payer: Self-pay | Admitting: Family Medicine

## 2013-07-07 ENCOUNTER — Ambulatory Visit
Admission: RE | Admit: 2013-07-07 | Discharge: 2013-07-07 | Disposition: A | Discharge status: Home / Self Care | Payer: Medicare Other | Source: Ambulatory Visit | Attending: Family Medicine | Admitting: Family Medicine

## 2013-07-07 VITALS — BP 160/70 | HR 74 | Temp 97.9°F | Resp 16 | Ht 65.0 in | Wt 142.0 lb

## 2013-07-07 DIAGNOSIS — S92301A Fracture of unspecified metatarsal bone(s), right foot, initial encounter for closed fracture: Secondary | ICD-10-CM

## 2013-07-07 DIAGNOSIS — S92309A Fracture of unspecified metatarsal bone(s), unspecified foot, initial encounter for closed fracture: Secondary | ICD-10-CM

## 2013-07-07 DIAGNOSIS — M7989 Other specified soft tissue disorders: Secondary | ICD-10-CM

## 2013-07-07 DIAGNOSIS — S92353A Displaced fracture of fifth metatarsal bone, unspecified foot, initial encounter for closed fracture: Secondary | ICD-10-CM

## 2013-07-07 NOTE — Progress Notes (Signed)
Subjective:    Patient ID: [name], male    DOB: 12/26/1921, 80 y.o.   MRN: [id number]  HPI 06/18/13 Patient fell Friday. Ever since he's had pain and swelling in the dorsum of his right foot. He now has +2 pitting edema in the right foot and +1 edema up to the midshin. He is very tender to palpation over the dorsum of the right midfoot particularly over the metatarsal. He has no pain with range of motion in his ankle. He does complain of pain with walking.At that time, my plan was:  Fracture of 4th MT vs sprain.  Obtain xray to eval further.  Post op shoe for 4 weeks if fractured.  Await x-ray results. 07/07/13 Patient's x-ray revealed a fracture of the fifth metatarsal that was mild. We elected to try conservative therapy and place the patient in a postop shoe and recommended weightbearing as tolerated. The patient is here today for a recheck. Repeat x-ray shows no healing of the spiral fracture in the fifth metatarsal. However the patient is completely asymptomatic. He denies any pain in his foot. He has no pain with ambulation. He has no pain with palpation over the fractured bone. Unfortunately he is having significant edema in his right foot and up to the mid level of the right shin that is 2+ in nature and asymmetric compared to the other side.  Also complaining of some pain in his inferior calf. Past Medical History  Diagnosis Date  . Diabetes mellitus   . Stroke   . Cataract   . CAD (coronary artery disease)   . Carotid stenosis 09/24/11    right bulb/proximal ICA demonstrates a mild amont of fibrous plaque elevating velocitieswithin the proximal and mid segments of the internal carotid artery. Left CEA demonstrats a trace amount of fibrous plaque with  no evidence of a significant diameter reduction, tortuosity or any other vascular abnormality.  . Hypertension 08/28/09    ECHO- EF 50-55%  . Hyperlipidemia 06/02/08    Nuclear stress test- high risk scan EF 29% Post stress LV  cavity enlargement suggests multi vessel disease.   Current Outpatient Prescriptions on File Prior to Visit  Medication Sig Dispense Refill  . atorvastatin (LIPITOR) 80 MG tablet Take 80 mg by mouth daily. Take a half tablet by mouth daily      . benazepril (LOTENSIN) 10 MG tablet TAKE 1 TABLET BY MOUTH DAILY  30 tablet  1  . carvedilol (COREG) 12.5 MG tablet TAKE 1 TABLET BY MOUTH TWICE DAILY WITH MEALS.  60 tablet  7  . clopidogrel (PLAVIX) 75 MG tablet TAKE 1 TABLET BY MOUTH DAILY  30 tablet  2  . furosemide (LASIX) 20 MG tablet Take half tablet daily      . potassium chloride SA (K-DUR,KLOR-CON) 20 MEQ tablet Take 1 tablet (20 mEq total) by mouth daily.  30 tablet  11  . pyridOXINE (VITAMIN B-6) 100 MG tablet Take 100 mg by mouth daily.      Marland Kitchen. VITAMIN D, CHOLECALCIFEROL, PO Take 1,000 Units by mouth daily.         No current facility-administered medications on file prior to visit.   No Known Allergies History   Social History  . Marital Status: Widowed    Spouse Name: N/A    Number of Children: 2  . Years of Education: 10   Occupational History  . retired    Social History Main Topics  . Smoking status: Former Smoker  Quit date: 02/28/1978  . Smokeless tobacco: Never Used  . Alcohol Use: No  . Drug Use: No  . Sexual Activity: Not on file   Other Topics Concern  . Not on file   Social History Narrative  . No narrative on file      Review of Systems  All other systems reviewed and are negative.      Objective:   Physical Exam  Vitals reviewed. Cardiovascular: Normal rate and regular rhythm.   Pulmonary/Chest: Effort normal and breath sounds normal.  Musculoskeletal: He exhibits edema.       Right ankle: He exhibits swelling. He exhibits normal range of motion and no ecchymosis.       Right foot: He exhibits normal range of motion, no tenderness, no bony tenderness and no swelling.          Assessment & Plan:  Leg swelling - Plan: US Venous Img  Lower Unilateral Right  Fracture of 5th metatarsal  I discussed referring the patient to orthopedic surgeon for open reduction internal fixation of the spiral fracture.  However, I, the patient, and his daughter agree that given his age and lack of symptoms, the patient would not benefit from such an invasive procedure. Therefore we have recommended conservative therapy only. He will continue to wear the postop shoe for another 2-4 weeks. Afterwards he is in no pain, we will discontinue the postop shoe altogether. In [place] concerned about them and swelling he is having in the right foot. While this could be dependent edema due to his fracture, I will schedule the patient for a venous ultrasound to rule out DVT

## 2013-07-08 ENCOUNTER — Ambulatory Visit
Admission: RE | Admit: 2013-07-08 | Discharge: 2013-07-08 | Disposition: A | Discharge status: Home / Self Care | Payer: Medicare Other | Source: Ambulatory Visit | Attending: Family Medicine | Admitting: Family Medicine

## 2013-07-08 DIAGNOSIS — M7989 Other specified soft tissue disorders: Secondary | ICD-10-CM

## 2013-07-11 ENCOUNTER — Other Ambulatory Visit: Payer: Self-pay | Admitting: Family Medicine

## 2013-08-13 ENCOUNTER — Other Ambulatory Visit: Payer: Self-pay | Admitting: Family Medicine

## 2013-08-13 NOTE — Telephone Encounter (Signed)
Refill appropriate and filled per protocol. 

## 2013-08-25 ENCOUNTER — Ambulatory Visit (INDEPENDENT_AMBULATORY_CARE_PROVIDER_SITE_OTHER): Payer: Medicare Other | Admitting: Podiatry

## 2013-08-25 ENCOUNTER — Encounter: Payer: Self-pay | Admitting: Podiatry

## 2013-08-25 VITALS — BP 168/84 | HR 67 | Resp 16

## 2013-08-25 DIAGNOSIS — L600 Ingrowing nail: Secondary | ICD-10-CM

## 2013-08-25 DIAGNOSIS — B351 Tinea unguium: Secondary | ICD-10-CM

## 2013-08-25 DIAGNOSIS — M79609 Pain in unspecified limb: Secondary | ICD-10-CM

## 2013-08-25 DIAGNOSIS — M79676 Pain in unspecified toe(s): Secondary | ICD-10-CM

## 2013-08-25 NOTE — Patient Instructions (Signed)
Diabetes and Foot Care Diabetes may cause you to have problems because of poor blood supply (circulation) to your feet and legs. This may cause the skin on your feet to become thinner, break easier, and heal more slowly. Your skin may become dry, and the skin may peel and crack. You may also have nerve damage in your legs and feet causing decreased feeling in them. You may not notice minor injuries to your feet that could lead to infections or more serious problems. Taking care of your feet is one of the most important things you can do for yourself.  HOME CARE INSTRUCTIONS  Wear shoes at all times, even in the house. Do not go barefoot. Bare feet are easily injured.  Check your feet daily for blisters, cuts, and redness. If you cannot see the bottom of your feet, use a mirror or ask someone for help.  Wash your feet with warm water (do not use hot water) and mild soap. Then pat your feet and the areas between your toes until they are completely dry. Do not soak your feet as this can dry your skin.  Apply a moisturizing lotion or petroleum jelly (that does not contain alcohol and is unscented) to the skin on your feet and to dry, brittle toenails. Do not apply lotion between your toes.  Trim your toenails straight across. Do not dig under them or around the cuticle. File the edges of your nails with an emery board or nail file.  Do not cut corns or calluses or try to remove them with medicine.  Wear clean socks or stockings every day. Make sure they are not too tight. Do not wear knee-high stockings since they may decrease blood flow to your legs.  Wear shoes that fit properly and have enough cushioning. To break in new shoes, wear them for just a few hours a day. This prevents you from injuring your feet. Always look in your shoes before you put them on to be sure there are no objects inside.  Do not cross your legs. This may decrease the blood flow to your feet.  If you find a minor scrape,  cut, or break in the skin on your feet, keep it and the skin around it clean and dry. These areas may be cleansed with mild soap and water. Do not cleanse the area with peroxide, alcohol, or iodine.  When you remove an adhesive bandage, be sure not to damage the skin around it.  If you have a wound, look at it several times a day to make sure it is healing.  Do not use heating pads or hot water bottles. They may burn your skin. If you have lost feeling in your feet or legs, you may not know it is happening until it is too late.  Make sure your health care provider performs a complete foot exam at least annually or more often if you have foot problems. Report any cuts, sores, or bruises to your health care provider immediately. SEEK MEDICAL CARE IF:   You have an injury that is not healing.  You have cuts or breaks in the skin.  You have an ingrown nail.  You notice redness on your legs or feet.  You feel burning or tingling in your legs or feet.  You have pain or cramps in your legs and feet.  Your legs or feet are numb.  Your feet always feel cold. SEEK IMMEDIATE MEDICAL CARE IF:   There is increasing redness,   swelling, or pain in or around a wound.  There is a red line that goes up your leg.  Pus is coming from a wound.  You develop a fever or as directed by your health care provider.  You notice a bad smell coming from an ulcer or wound. Document Released: 02/03/2000 Document Revised: 10/08/2012 Document Reviewed: 07/15/2012 [employer] Patient Information 2015 [employer]. This information is not intended to replace advice given to you by your health care provider. Make sure you discuss any questions you have with your health care provider.  

## 2013-08-25 NOTE — Progress Notes (Signed)
He presents today with a chief complaint of painful elongated toenails one through 5 bilateral. History of nondisplaced fifth metatarsal fracture.  Objective: Pulses are palpable bilateral. Nails are thick yellow dystrophic onychomycotic. Right foot is swollen. No pain on palpation fifth metatarsal right foot.  Assessment: Well-healing fracture fifth met right. Pain in limb secondary to onychomycosis 1 through 5 bilateral.  Plan: Debridement of nails 1 through 5 bilateral covered service secondary to pain.

## 2013-09-09 ENCOUNTER — Telehealth: Payer: Self-pay | Admitting: Family Medicine

## 2013-09-09 NOTE — Telephone Encounter (Signed)
Called and LM for pt to call and schedule [facility] and CPE

## 2013-09-13 ENCOUNTER — Other Ambulatory Visit: Payer: Self-pay | Admitting: Family Medicine

## 2013-09-13 ENCOUNTER — Other Ambulatory Visit: Payer: Self-pay | Admitting: Cardiovascular Disease

## 2013-09-15 ENCOUNTER — Other Ambulatory Visit: Payer: Self-pay | Admitting: Family Medicine

## 2013-09-15 DIAGNOSIS — Z0000 Encounter for general adult medical examination without abnormal findings: Secondary | ICD-10-CM

## 2013-09-15 DIAGNOSIS — I2510 Atherosclerotic heart disease of native coronary artery without angina pectoris: Secondary | ICD-10-CM

## 2013-09-15 DIAGNOSIS — Z79899 Other long term (current) drug therapy: Secondary | ICD-10-CM

## 2013-09-15 DIAGNOSIS — I10 Essential (primary) hypertension: Secondary | ICD-10-CM

## 2013-09-15 DIAGNOSIS — Z125 Encounter for screening for malignant neoplasm of prostate: Secondary | ICD-10-CM

## 2013-09-15 DIAGNOSIS — E119 Type 2 diabetes mellitus without complications: Secondary | ICD-10-CM

## 2013-09-15 DIAGNOSIS — E785 Hyperlipidemia, unspecified: Secondary | ICD-10-CM

## 2013-09-25 ENCOUNTER — Other Ambulatory Visit: Payer: Medicare Other

## 2013-09-25 DIAGNOSIS — I2510 Atherosclerotic heart disease of native coronary artery without angina pectoris: Secondary | ICD-10-CM

## 2013-09-25 DIAGNOSIS — Z125 Encounter for screening for malignant neoplasm of prostate: Secondary | ICD-10-CM

## 2013-09-25 DIAGNOSIS — I10 Essential (primary) hypertension: Secondary | ICD-10-CM

## 2013-09-25 DIAGNOSIS — Z79899 Other long term (current) drug therapy: Secondary | ICD-10-CM

## 2013-09-25 DIAGNOSIS — Z0000 Encounter for general adult medical examination without abnormal findings: Secondary | ICD-10-CM

## 2013-09-25 DIAGNOSIS — E785 Hyperlipidemia, unspecified: Secondary | ICD-10-CM

## 2013-09-25 DIAGNOSIS — E119 Type 2 diabetes mellitus without complications: Secondary | ICD-10-CM

## 2013-09-25 LAB — COMPLETE METABOLIC PANEL WITH GFR
ALK PHOS: 48 U/L (ref 39–117)
ALT: 14 U/L (ref 0–53)
AST: 21 U/L (ref 0–37)
Albumin: 3.7 g/dL (ref 3.5–5.2)
BILIRUBIN TOTAL: 0.7 mg/dL (ref 0.2–1.2)
BUN: 21 mg/dL (ref 6–23)
CO2: 29 mEq/L (ref 19–32)
CREATININE: 1.26 mg/dL (ref 0.50–1.35)
Calcium: 9.5 mg/dL (ref 8.4–10.5)
Chloride: 104 mEq/L (ref 96–112)
GFR, Est African American: 58 mL/min — ABNORMAL LOW
GFR, Est Non African American: 50 mL/min — ABNORMAL LOW
Glucose, Bld: 131 mg/dL — ABNORMAL HIGH (ref 70–99)
Potassium: 4.8 mEq/L (ref 3.5–5.3)
SODIUM: 141 meq/L (ref 135–145)
TOTAL PROTEIN: 6.2 g/dL (ref 6.0–8.3)

## 2013-09-25 LAB — LIPID PANEL
CHOL/HDL RATIO: 2.6 ratio
Cholesterol: 114 mg/dL (ref 0–200)
HDL: 44 mg/dL (ref >39)
LDL CALC: 55 mg/dL (ref 0–99)
Triglycerides: 75 mg/dL (ref <150)
VLDL: 15 mg/dL (ref 0–40)

## 2013-09-25 LAB — CBC WITH DIFFERENTIAL/PLATELET
BASOS PCT: 0 % (ref 0–1)
Basophils Absolute: 0 10*3/uL (ref 0.0–0.1)
EOS PCT: 2 % (ref 0–5)
Eosinophils Absolute: 0.1 10*3/uL (ref 0.0–0.7)
HCT: 39.3 % (ref 39.0–52.0)
HEMOGLOBIN: 13.7 g/dL (ref 13.0–17.0)
LYMPHS ABS: 0.9 10*3/uL (ref 0.7–4.0)
Lymphocytes Relative: 15 % (ref 12–46)
MCH: 31.1 pg (ref 26.0–34.0)
MCHC: 34.9 g/dL (ref 30.0–36.0)
MCV: 89.1 fL (ref 78.0–100.0)
MONOS PCT: 9 % (ref 3–12)
Monocytes Absolute: 0.5 10*3/uL (ref 0.1–1.0)
NEUTROS PCT: 74 % (ref 43–77)
Neutro Abs: 4.4 10*3/uL (ref 1.7–7.7)
Platelets: 161 10*3/uL (ref 150–400)
RBC: 4.41 MIL/uL (ref 4.22–5.81)
RDW: 13.8 % (ref 11.5–15.5)
WBC: 6 10*3/uL (ref 4.0–10.5)

## 2013-09-25 LAB — HEMOGLOBIN A1C
Hgb A1c MFr Bld: 5.9 % — ABNORMAL HIGH (ref <5.7)
Mean Plasma Glucose: 123 mg/dL — ABNORMAL HIGH (ref <117)

## 2013-09-25 LAB — TSH: TSH: 1.609 u[IU]/mL (ref 0.350–4.500)

## 2013-09-26 LAB — PSA, MEDICARE: PSA: 0.19 ng/mL (ref <=4.00)

## 2013-10-02 ENCOUNTER — Encounter: Payer: Self-pay | Admitting: Family Medicine

## 2013-10-02 ENCOUNTER — Ambulatory Visit (INDEPENDENT_AMBULATORY_CARE_PROVIDER_SITE_OTHER): Payer: Medicare Other | Admitting: Family Medicine

## 2013-10-02 VITALS — BP 140/80 | HR 68 | Temp 97.6°F | Resp 20 | Ht 65.0 in | Wt 138.0 lb

## 2013-10-02 DIAGNOSIS — Z0000 Encounter for general adult medical examination without abnormal findings: Secondary | ICD-10-CM

## 2013-10-05 ENCOUNTER — Encounter: Payer: Self-pay | Admitting: Family Medicine

## 2013-10-05 NOTE — Progress Notes (Signed)
Subjective:    Patient ID: [name], male    DOB: [birth year], 80 y.o.   MRN: [id number]  HPI Subjective:   Patient presents for Medicare Annual/Subsequent preventive examination.  Patient is here today for complete physical exam. He does report that he has some difficulty with his memory. He also reports difficulty with balance and occasional falls. Patient is unable to remember 3 objects on rapid recall. He is unable to perform serial sevens or spell WORLD backward.  He does get confused on what year it is. He is coming today by his daughter who states that he is still performing high ADLs at home without supervision. Concerning his poor balance, he is working with a physical therapist at home (who is his neighbor).  His most recent lab work is listed below: Appointment on 09/25/2013  Component Date Value Ref Range Status  . WBC 09/25/2013 6.0  4.0 - 10.5 K/uL Final  . RBC 09/25/2013 4.41  4.22 - 5.81 MIL/uL Final  . Hemoglobin 09/25/2013 13.7  13.0 - 17.0 g/dL Final  . HCT 09/25/2013 39.3  39.0 - 52.0 % Final  . MCV 09/25/2013 89.1  78.0 - 100.0 fL Final  . MCH 09/25/2013 31.1  26.0 - 34.0 pg Final  . MCHC 09/25/2013 34.9  30.0 - 36.0 g/dL Final  . RDW 09/25/2013 13.8  11.5 - 15.5 % Final  . Platelets 09/25/2013 161  150 - 400 K/uL Final  . Neutrophils Relative % 09/25/2013 74  43 - 77 % Final  . Neutro Abs 09/25/2013 4.4  1.7 - 7.7 K/uL Final  . Lymphocytes Relative 09/25/2013 15  12 - 46 % Final  . Lymphs Abs 09/25/2013 0.9  0.7 - 4.0 K/uL Final  . Monocytes Relative 09/25/2013 9  3 - 12 % Final  . Monocytes Absolute 09/25/2013 0.5  0.1 - 1.0 K/uL Final  . Eosinophils Relative 09/25/2013 2  0 - 5 % Final  . Eosinophils Absolute 09/25/2013 0.1  0.0 - 0.7 K/uL Final  . Basophils Relative 09/25/2013 0  0 - 1 % Final  . Basophils Absolute 09/25/2013 0.0  0.0 - 0.1 K/uL Final  . Smear Review 09/25/2013 Criteria for review not met   Final  . Cholesterol 09/25/2013 114  0 - 200 mg/dL  Final   Comment: ATP III Classification:                                < 200        mg/dL        Desirable                               200 - 239     mg/dL        Borderline High                               >= 240        mg/dL        High                             . Triglycerides 09/25/2013 75  <150 mg/dL Final  . HDL 09/25/2013 44  >39 mg/dL Final  . Total CHOL/HDL Ratio 09/25/2013 2.6   Final  .  VLDL 09/25/2013 15  0 - 40 mg/dL Final  . LDL Cholesterol 09/25/2013 55  0 - 99 mg/dL Final   Comment:                            Total Cholesterol/HDL Ratio:CHD Risk                                                 Coronary Heart Disease Risk Table                                                                 Men       Women                                   1/2 Average Risk              3.4        3.3                                       Average Risk              5.0        4.4                                    2X Average Risk              9.6        7.1                                    3X Average Risk             23.4       11.0                          Use the calculated Patient Ratio above and the CHD Risk table                           to determine the patient's CHD Risk.                          ATP III Classification (LDL):                                < 100        mg/dL         Optimal                               100 - 129  mg/dL         Near or Above Optimal                               130 - 159     mg/dL         Borderline High                               160 - 189     mg/dL         High                                > 190        mg/dL         Very High                             . PSA 09/25/2013 0.19  <=4.00 ng/mL Final   Comment: Test Methodology: ECLIA PSA (Electrochemiluminescence Immunoassay)                                                     For PSA values from 2.5-4.0, particularly in younger men <60 years                          old, the AUA and  NCCN suggest testing for % Free PSA (3515) and                          evaluation of the rate of increase in PSA (PSA velocity).  . TSH 09/25/2013 1.609  0.350 - 4.500 uIU/mL Final  . Sodium 09/25/2013 141  135 - 145 mEq/L Final  . Potassium 09/25/2013 4.8  3.5 - 5.3 mEq/L Final  . Chloride 09/25/2013 104  96 - 112 mEq/L Final  . CO2 09/25/2013 29  19 - 32 mEq/L Final  . Glucose, Bld 09/25/2013 131* 70 - 99 mg/dL Final  . BUN 09/25/2013 21  6 - 23 mg/dL Final  . Creat 09/25/2013 1.26  0.50 - 1.35 mg/dL Final  . Total Bilirubin 09/25/2013 0.7  0.2 - 1.2 mg/dL Final  . Alkaline Phosphatase 09/25/2013 48  39 - 117 U/L Final  . AST 09/25/2013 21  0 - 37 U/L Final  . ALT 09/25/2013 14  0 - 53 U/L Final  . Total Protein 09/25/2013 6.2  6.0 - 8.3 g/dL Final  . Albumin 09/25/2013 3.7  3.5 - 5.2 g/dL Final  . Calcium 09/25/2013 9.5  8.4 - 10.5 mg/dL Final  . GFR, Est African American 09/25/2013 58*  Final  . GFR, Est Non African American 09/25/2013 50*  Final   Comment:                            The estimated GFR is a calculation valid for adults (>=36 years old)                          that uses the  CKD-EPI algorithm to adjust for age and sex. It is                            not to be used for children, pregnant women, hospitalized patients,                             patients on dialysis, or with rapidly changing kidney function.                          According to the NKDEP, eGFR >89 is normal, 60-89 shows mild                          impairment, 30-59 shows moderate impairment, 15-29 shows severe                          impairment and <15 is ESRD.                             Marland Kitchen Hemoglobin A1C 09/25/2013 5.9* <5.7 % Final   Comment:                                                                                                 According to the ADA Clinical Practice Recommendations for 2011, when                          HbA1c is used as a screening test:                                                         >=6.5%   Diagnostic of Diabetes Mellitus                                     (if abnormal result is confirmed)                                                     5.7-6.4%   Increased risk of developing Diabetes Mellitus                                                     References:Diagnosis and Classification of Diabetes Mellitus,Diabetes  JKKX,3818,29(HBZJI 1):S62-S69 and Standards of Medical Care in                                  Diabetes - 2011,Diabetes RCVE,9381,01 (Suppl 1):S11-S61.                             . Mean Plasma Glucose 09/25/2013 123* <117 mg/dL Final    Review Past Medical/Family/Social: Past Medical History  Diagnosis Date  . Diabetes mellitus   . Stroke   . Cataract   . CAD (coronary artery disease)   . Carotid stenosis 09/24/11    right bulb/proximal ICA demonstrates a mild amont of fibrous plaque elevating velocitieswithin the proximal and mid segments of the internal carotid artery. Left CEA demonstrats a trace amount of fibrous plaque with  no evidence of a significant diameter reduction, tortuosity or any other vascular abnormality.  . Hypertension 08/28/09    ECHO- EF 50-55%  . Hyperlipidemia 06/02/08    Nuclear stress test- high risk scan EF 29% Post stress LV cavity enlargement suggests multi vessel disease.   Past Surgical History  Procedure Laterality Date  . Appendectomy    . Coronary artery bypass graft      + AV replacement  . Carpal tunnel release      Left  . Carotid endarterectomy      with darcon patch angioplasty 03/17/2001  . Cardiac catheterization  06/10/2008  . Tee without cardioversion N/A 08/15/2012    Procedure: TRANSESOPHAGEAL ECHOCARDIOGRAM (TEE);  Surgeon: Fay Records, MD;  Location: [facility] ENDOSCOPY;  Service: Cardiovascular;  Laterality: N/A;   Current Outpatient Prescriptions on File Prior to Visit  Medication Sig Dispense Refill  . atorvastatin (LIPITOR) 80 MG tablet Take 80  mg by mouth daily. Take a half tablet by mouth daily      . atorvastatin (LIPITOR) 80 MG tablet TAKE 1 TABLET BY MOUTH EVERY DAY  30 tablet  6  . benazepril (LOTENSIN) 10 MG tablet TAKE 1 TABLET BY MOUTH DAILY  30 tablet  3  . carvedilol (COREG) 12.5 MG tablet TAKE 1 TABLET BY MOUTH TWICE DAILY WITH MEALS  60 tablet  2  . clopidogrel (PLAVIX) 75 MG tablet TAKE 1 TABLET BY MOUTH DAILY  30 tablet  5  . furosemide (LASIX) 20 MG tablet Take half tablet daily      . potassium chloride SA (K-DUR,KLOR-CON) 20 MEQ tablet Take 1 tablet (20 mEq total) by mouth daily.  30 tablet  11  . pyridOXINE (VITAMIN B-6) 100 MG tablet Take 100 mg by mouth daily.      Marland Kitchen VITAMIN D, CHOLECALCIFEROL, PO Take 1,000 Units by mouth daily.         No current facility-administered medications on file prior to visit.   No Known Allergies History   Social History  . Marital Status: Widowed    Spouse Name: N/A    Number of Children: 2  . Years of Education: 10   Occupational History  . retired    Social History Main Topics  . Smoking status: Former Smoker    Quit date: 02/28/1978  . Smokeless tobacco: Never Used  . Alcohol Use: No  . Drug Use: No  . Sexual Activity: Not on file   Other Topics Concern  . Not on file   Social History Narrative  . No narrative on file  No family history on file.  Depression Screen  (Note: if answer to either of the following is "Yes", a more complete depression screening is indicated)  Over the past two weeks, have you felt down, depressed or hopeless? No Over the past two weeks, have you felt little interest or pleasure in doing things? No Have you lost interest or pleasure in daily life? No Do you often feel hopeless? No Do you cry easily over simple problems? No   Activities of Daily Living  In your present state of health, do you have any difficulty performing the following activities?:  Driving? No  Managing money? No  Feeding yourself? No  Getting from bed to  chair? No  Climbing a flight of stairs? No  Preparing food and eating?: No  Bathing or showering? No  Getting dressed: No  Getting to the toilet? No  Using the toilet:No  Moving around from place to place: No  In the past year have you fallen or had a near fall?:No  Are you sexually active? No  Do you have more than one partner? No   Hearing Difficulties: No  Do you often ask people to speak up or repeat themselves? No  Do you experience ringing or noises in your ears? No Do you have difficulty understanding soft or whispered voices? No  Do you feel that you have a problem with memory? YES Do you often misplace items? No  Do you feel safe at home? Yes  Cognitive Testing  Alert? Yes Normal Appearance?Yes  Oriented to person? Yes Place? Yes  Time? Yes  Recall of three objects? NO Can perform simple calculations? NO Displays appropriate judgment?Yes  Can read the correct time from a watch face?Yes     Screening Tests / Date Colonoscopy   due                  Pneumovax utd prevnar utd    Review of Systems  All other systems reviewed and are negative.      Objective:   Physical Exam  Vitals reviewed. Constitutional: He is oriented to person, place, and time. He appears well-developed and well-nourished. No distress.  HENT:  Head: Normocephalic and atraumatic.  Right Ear: External ear normal.  Left Ear: External ear normal.  Nose: Nose normal.  Mouth/Throat: Oropharynx is clear and moist. No oropharyngeal exudate.  Eyes: Conjunctivae and EOM are normal. Pupils are equal, round, and reactive to light. Right eye exhibits no discharge. Left eye exhibits no discharge. No scleral icterus.  Neck: Normal range of motion. Neck supple. No JVD present. No tracheal deviation present. No thyromegaly present.  Cardiovascular: Normal rate, regular rhythm, normal heart sounds and intact distal pulses.  Exam reveals no gallop and no friction rub.   No murmur heard. Pulmonary/Chest:  Effort normal and breath sounds normal. No stridor. No respiratory distress. He has no wheezes. He has no rales. He exhibits no tenderness.  Abdominal: Soft. Bowel sounds are normal. He exhibits no distension and no mass. There is no tenderness. There is no rebound and no guarding.  Musculoskeletal: Normal range of motion. He exhibits no edema and no tenderness.  Lymphadenopathy:    He has no cervical adenopathy.  Neurological: He is alert and oriented to person, place, and time. He has normal reflexes. He displays normal reflexes. No cranial nerve deficit. He exhibits normal muscle tone. Coordination normal.  Skin: Skin is warm. No rash noted. He is not diaphoretic. No erythema. No pallor.  Psychiatric:  He has a normal mood and affect. His behavior is normal. Judgment and thought content normal.          Assessment & Plan:  Routine general medical examination at a health care facility  I had a long discussion with the patient regarding his age. At this point I do not feel it is wise to proceed with colonoscopy or prostate screening. His immunizations are up to date. Also had a discussion regarding fall prevention. I recommended home health physical therapy but at this time, he and his daughter are comfortable and is working with his neighbor.  He also had a discussion about possibly starting Aricept for dimension. The daughter would like to discuss with the patient further as long as her sister before making a decision. Medicare Attestation  I have personally reviewed:  The patient's medical and social history  Their use of alcohol, tobacco or illicit drugs  Their current medications and supplements  The patient's functional ability including ADLs,fall risks, home safety risks, cognitive, and hearing and visual impairment  Diet and physical activities  Evidence for depression or mood disorders  The patient's weight, height, BMI, and visual acuity have been recorded in the chart. I have made  referrals, counseling, and provided education to the patient based on review of the above and I have provided the patient with a written personalized care plan for preventive services.

## 2013-10-12 ENCOUNTER — Other Ambulatory Visit: Payer: Self-pay | Admitting: Cardiovascular Disease

## 2013-10-12 NOTE — Telephone Encounter (Signed)
Rx refill sent to patient pharmacy   

## 2013-11-07 ENCOUNTER — Other Ambulatory Visit: Payer: Self-pay | Admitting: Cardiovascular Disease

## 2013-11-09 NOTE — Telephone Encounter (Signed)
Rx was sent to pharmacy electronically. Patient needs appointment for future refills. Second warning given. 

## 2013-11-23 ENCOUNTER — Other Ambulatory Visit: Payer: Self-pay | Admitting: Cardiovascular Disease

## 2013-11-24 NOTE — Telephone Encounter (Signed)
Rx was sent to pharmacy electronically. OV 11/18 with Dr. Tresa Endo

## 2013-12-01 ENCOUNTER — Other Ambulatory Visit: Payer: Self-pay | Admitting: Cardiovascular Disease

## 2013-12-01 ENCOUNTER — Ambulatory Visit (INDEPENDENT_AMBULATORY_CARE_PROVIDER_SITE_OTHER): Payer: Medicare Other | Admitting: *Deleted

## 2013-12-01 DIAGNOSIS — Z23 Encounter for immunization: Secondary | ICD-10-CM

## 2013-12-01 NOTE — Telephone Encounter (Signed)
Rx was sent to pharmacy electronically. 

## 2013-12-08 ENCOUNTER — Ambulatory Visit (INDEPENDENT_AMBULATORY_CARE_PROVIDER_SITE_OTHER): Payer: Medicare Other | Admitting: Podiatry

## 2013-12-08 ENCOUNTER — Encounter: Payer: Self-pay | Admitting: Podiatry

## 2013-12-08 DIAGNOSIS — M79676 Pain in unspecified toe(s): Secondary | ICD-10-CM

## 2013-12-08 DIAGNOSIS — B351 Tinea unguium: Secondary | ICD-10-CM

## 2013-12-08 NOTE — Patient Instructions (Signed)
Diabetes and Foot Care Diabetes may cause you to have problems because of poor blood supply (circulation) to your feet and legs. This may cause the skin on your feet to become thinner, break easier, and heal more slowly. Your skin may become dry, and the skin may peel and crack. You may also have nerve damage in your legs and feet causing decreased feeling in them. You may not notice minor injuries to your feet that could lead to infections or more serious problems. Taking care of your feet is one of the most important things you can do for yourself.  HOME CARE INSTRUCTIONS  Wear shoes at all times, even in the house. Do not go barefoot. Bare feet are easily injured.  Check your feet daily for blisters, cuts, and redness. If you cannot see the bottom of your feet, use a mirror or ask someone for help.  Wash your feet with warm water (do not use hot water) and mild soap. Then pat your feet and the areas between your toes until they are completely dry. Do not soak your feet as this can dry your skin.  Apply a moisturizing lotion or petroleum jelly (that does not contain alcohol and is unscented) to the skin on your feet and to dry, brittle toenails. Do not apply lotion between your toes.  Trim your toenails straight across. Do not dig under them or around the cuticle. File the edges of your nails with an emery board or nail file.  Do not cut corns or calluses or try to remove them with medicine.  Wear clean socks or stockings every day. Make sure they are not too tight. Do not wear knee-high stockings since they may decrease blood flow to your legs.  Wear shoes that fit properly and have enough cushioning. To break in new shoes, wear them for just a few hours a day. This prevents you from injuring your feet. Always look in your shoes before you put them on to be sure there are no objects inside.  Do not cross your legs. This may decrease the blood flow to your feet.  If you find a minor scrape,  cut, or break in the skin on your feet, keep it and the skin around it clean and dry. These areas may be cleansed with mild soap and water. Do not cleanse the area with peroxide, alcohol, or iodine.  When you remove an adhesive bandage, be sure not to damage the skin around it.  If you have a wound, look at it several times a day to make sure it is healing.  Do not use heating pads or hot water bottles. They may burn your skin. If you have lost feeling in your feet or legs, you may not know it is happening until it is too late.  Make sure your health care provider performs a complete foot exam at least annually or more often if you have foot problems. Report any cuts, sores, or bruises to your health care provider immediately. SEEK MEDICAL CARE IF:   You have an injury that is not healing.  You have cuts or breaks in the skin.  You have an ingrown nail.  You notice redness on your legs or feet.  You feel burning or tingling in your legs or feet.  You have pain or cramps in your legs and feet.  Your legs or feet are numb.  Your feet always feel cold. SEEK IMMEDIATE MEDICAL CARE IF:   There is increasing redness,   swelling, or pain in or around a wound.  There is a red line that goes up your leg.  Pus is coming from a wound.  You develop a fever or as directed by your health care provider.  You notice a bad smell coming from an ulcer or wound. Document Released: 02/03/2000 Document Revised: 10/08/2012 Document Reviewed: 07/15/2012 [employer] Patient Information 2015 [employer]. This information is not intended to replace advice given to you by your health care provider. Make sure you discuss any questions you have with your health care provider.  

## 2013-12-08 NOTE — Progress Notes (Signed)
   Subjective:    Patient ID: [name], male    DOB: 10/05/1921, 80 y.o.   MRN: [id number]  HPI Comments: Presents today chief complaint of painful elongated toenails.  Objective: Pulses are palpable bilateral nails are thick, yellow dystrophic onychomycosis and painful palpation.   Assessment: Onychomycosis with pain in limb.  Plan: Treatment of nails in thickness and length as covered service secondary to pain.      Review of Systems     Objective:   Physical Exam        Assessment & Plan:   

## 2013-12-14 ENCOUNTER — Other Ambulatory Visit: Payer: Self-pay | Admitting: Family Medicine

## 2013-12-15 ENCOUNTER — Other Ambulatory Visit: Payer: Self-pay | Admitting: Cardiovascular Disease

## 2013-12-15 NOTE — Telephone Encounter (Signed)
Rx was sent to pharmacy electronically. Appointment 11/18

## 2014-01-06 ENCOUNTER — Encounter: Payer: Self-pay | Admitting: Cardiovascular Disease

## 2014-01-06 ENCOUNTER — Ambulatory Visit (INDEPENDENT_AMBULATORY_CARE_PROVIDER_SITE_OTHER): Payer: Medicare Other | Admitting: Cardiovascular Disease

## 2014-01-06 VITALS — BP 130/80 | HR 70 | Ht 68.0 in | Wt 135.1 lb

## 2014-01-06 DIAGNOSIS — I10 Essential (primary) hypertension: Secondary | ICD-10-CM

## 2014-01-06 DIAGNOSIS — I779 Disorder of arteries and arterioles, unspecified: Secondary | ICD-10-CM

## 2014-01-06 DIAGNOSIS — I2510 Atherosclerotic heart disease of native coronary artery without angina pectoris: Secondary | ICD-10-CM

## 2014-01-06 DIAGNOSIS — I739 Peripheral vascular disease, unspecified: Secondary | ICD-10-CM

## 2014-01-06 DIAGNOSIS — Z8679 Personal history of other diseases of the circulatory system: Secondary | ICD-10-CM

## 2014-01-06 DIAGNOSIS — E785 Hyperlipidemia, unspecified: Secondary | ICD-10-CM

## 2014-01-06 DIAGNOSIS — G459 Transient cerebral ischemic attack, unspecified: Secondary | ICD-10-CM

## 2014-01-06 DIAGNOSIS — I6523 Occlusion and stenosis of bilateral carotid arteries: Secondary | ICD-10-CM

## 2014-01-06 NOTE — Progress Notes (Signed)
Patient ID: [name], male   DOB: [birth year], 80 y.o.   MRN: [id number]    HPI: [name], is a 80 y.o. male who is a former patient of Dr. Caprice Kluver who established cardiology care with me on July 21, 2012. He presents to the office for one year cardiology evaluation.  [name] has a history of known coronary as well as valvular disease. In April 2010 was found to have severe coronary artery disease with 95% left main stenosis, segmental 95 and 90% circumflex stenoses, and total occlusion of the proximal RCA with left to right collaterals. I did perform the catheterization at that time and he also had a 22 mm peak to peak gradient. He underwent CABG revascularization surgery with a LIMA to the LAD, SVG to the diagonal, SVG to the circumflex, and an SVG to the PDA and underwent tissue valve replacement for his aortic stenosis. The following year he suffered a CVA in May 2011.  In 2013 he suffered a TIA with almost complete resolution of symptoms. He does have a prior history of left carotid endarterectomy. He last saw Dr. Clarene Duke in July 2013. When I saw him initially he denied any recent chest pain. He did note ankle swelling right greater than left and I adjusted his diuretic therapy. An echo Doppler study on 07/29/2012 showed an ejection fraction in the 55-60% range. The possibility for mild basal posterior hypokinesis could not be excluded. He did have grade 1 diastolic dysfunction. His bioprosthetic aortic valve was well-seated. There was no aortic insufficiency. He did have mild left atrial enlargement and mild pulmonary hypertension with estimated PA pressure 40 mm. Mild to moderate TR.  [name]  presented to [facility] emergency room on August 13, 2012 and was evaluated by by Dr. Dawson Bills with complaints of difficulty walking, right leg heaviness, and worsening slurred speech. He was felt to have acute stroke on MRI and neurology was consulted. His Plavix was continued. On CT scan he was  found to have a subtle small acute nonhemorrhagic infarct in the posterior left corona radiata extending into the posterior limb of the left internal capsule.  There were also was evidence for remote bilateral frontal lobe infarcts and right occipital lobe infarct with encephalomalacia.  There was no evidence for intracranial hemorrhage. A limited subsequent TEE did not show any evidence for left atrial thrombus. There is no PFO by color Doppler, with agitated saline a few bubbles was seen later and a PFO could not be completely excluded. Apparently during that hospitalization he was taken off Lasix.   Since I saw him one year ago, his daughter feels that he may have had another mild TIA.  His last carotid study was in June 2014 which showed 60-79% stenosis involving the right internal carotid artery, 40-59%.  She stenosis involving the left internal carotid artery.  Over the past year, he denies chest pain.  He is taking but hasn't filled 10 mg and is back on Lasix 20 mg for blood pressure and leg swelling and also takes carvedilol 12.5 mg twice a day.  He is on atorvastatin 80 mg for hyperlipidemia.  He presents now for one-year evaluation.  Past Medical History  Diagnosis Date  . Diabetes mellitus   . Stroke   . Cataract   . CAD (coronary artery disease)   . Carotid stenosis 09/24/11    right bulb/proximal ICA demonstrates a mild amont of fibrous plaque elevating velocitieswithin the proximal and mid  segments of the internal carotid artery. Left CEA demonstrats a trace amount of fibrous plaque with  no evidence of a significant diameter reduction, tortuosity or any other vascular abnormality.  . Hypertension 08/28/09    ECHO- EF 50-55%  . Hyperlipidemia 06/02/08    Nuclear stress test- high risk scan EF 29% Post stress LV cavity enlargement suggests multi vessel disease.    Past Surgical History  Procedure Laterality Date  . Appendectomy    . Coronary artery bypass graft      + AV  replacement  . Carpal tunnel release      Left  . Carotid endarterectomy      with darcon patch angioplasty 03/17/2001  . Cardiac catheterization  06/10/2008  . Tee without cardioversion N/A 08/15/2012    Procedure: TRANSESOPHAGEAL ECHOCARDIOGRAM (TEE);  Surgeon: Pricilla RifflePaula V Ross, MD;  [facility];  Service: Cardiovascular;  Laterality: N/A;    No Known Allergies  Current Outpatient Prescriptions  Medication Sig Dispense Refill  . atorvastatin (LIPITOR) 80 MG tablet Take 80 mg by mouth daily. Take a half tablet by mouth daily    . benazepril (LOTENSIN) 10 MG tablet TAKE 1 TABLET BY MOUTH DAILY 30 tablet 11  . carvedilol (COREG) 12.5 MG tablet Take 1 tablet (12.5 mg total) by mouth 2 (two) times daily with a meal. MUST KEEP APPOINTMENT 01/06/2014 WITH DR Tresa EndoKELLY FOR FUTURE REFILLS. 60 tablet 1  . clopidogrel (PLAVIX) 75 MG tablet TAKE 1 TABLET BY MOUTH DAILY 30 tablet 5  . furosemide (LASIX) 20 MG tablet Take 1 tablet (20 mg total) by mouth daily. (Patient taking differently: Take 20 mg by mouth daily. Pt take 1/2. (10 mg daily)) 30 tablet 0  . potassium chloride SA (K-DUR,KLOR-CON) 20 MEQ tablet TAKE 1 TABLET BY MOUTH DAILY 30 tablet 1  . pyridOXINE (VITAMIN B-6) 100 MG tablet Take 100 mg by mouth daily.    Marland Kitchen. VITAMIN D, CHOLECALCIFEROL, PO Take 1,000 Units by mouth daily.       No current facility-administered medications for this visit.   Social he is widowed he remains relatively active he has 2 children. No alcohol tobacco use.  ROS General: Negative; No fevers, chills, or night sweats;  HEENT: Negative; No changes in vision or hearing, sinus congestion, difficulty swallowing Pulmonary: Negative; No cough, wheezing, shortness of breath, hemoptysis Cardiovascular: Negative; No chest pain, presyncope, syncope, palpitations GI: Negative; No nausea, vomiting, diarrhea, or abdominal pain GU: Negative; No dysuria, hematuria, or difficulty voiding Musculoskeletal: Negative; no  myalgias, joint pain, or weakness Hematologic/Oncology: Negative; no easy bruising, bleeding Endocrine: Negative; no heat/cold intolerance; no diabetes Neuro: see history of present illness with remote infarct.; no changes in balance, headaches Skin: Negative; No rashes or skin lesions Psychiatric: Negative; No behavioral problems, depression Sleep: Negative; No snoring, daytime sleepiness, hypersomnolence, bruxism, restless legs, hypnogognic hallucinations, no cataplexy Other comprehensive 14 point system review is negative.   PE BP 130/80 mmHg  Pulse 70  Ht 5\' 8"  (1.727 m)  Wt 135 lb 1.6 oz (61.281 kg)  BMI 20.55 kg/m2  General: Alert, oriented, no distress.  HEENT: Normocephalic, atraumatic. Pupils round and reactive; sclera anicteric;  Nose without nasal septal hypertrophy Mouth/Parynx benign; Mallinpatti scale 3 Neck: No JVD, soft carotid bruits Lungs: clear to ausculatation and percussion; no wheezing or rales Heart: RRR, s1 s2 normal with a 2/6 systolic murmur in aortic region with no appreciable aortic insufficiency to Abdomen: soft, nontender; no hepatosplenomehaly, BS+; abdominal aorta nontender and not dilated by  palpation. Pulses 2+ Extremities: trace bilateral ankle swelling, Homan's sign negative  Neurologic: grossly nonfocal Psychological: Normal mood.  ECG (independently read by me):  Normal sinus rhythm with PAC, normal intervals.  Poor progression V1 through V3.  Prior July 2014 ECG: Sinus rhythm at 69 beats per minute with poor progression anteriorly V1 through V3. No ectopy.  LABS:  BMET    Component Value Date/Time   NA 141 09/25/2013 0848   K 4.8 09/25/2013 0848   CL 104 09/25/2013 0848   CO2 29 09/25/2013 0848   GLUCOSE 131* 09/25/2013 0848   BUN 21 09/25/2013 0848   CREATININE 1.26 09/25/2013 0848   CREATININE 1.08 08/14/2012 0601   CALCIUM 9.5 09/25/2013 0848   GFRNONAA 50* 09/25/2013 0848   GFRNONAA 59* 08/14/2012 0601   GFRAA 58* 09/25/2013  0848   GFRAA 69* 08/14/2012 0601     Hepatic Function Panel     Component Value Date/Time   PROT 6.2 09/25/2013 0848   ALBUMIN 3.7 09/25/2013 0848   AST 21 09/25/2013 0848   ALT 14 09/25/2013 0848   ALKPHOS 48 09/25/2013 0848   BILITOT 0.7 09/25/2013 0848   BILIDIR 0.2 08/25/2009 0902   IBILI 0.9 08/25/2009 0902     CBC    Component Value Date/Time   WBC 6.0 09/25/2013 0848   RBC 4.41 09/25/2013 0848   HGB 13.7 09/25/2013 0848   HCT 39.3 09/25/2013 0848   PLT 161 09/25/2013 0848   MCV 89.1 09/25/2013 0848   MCH 31.1 09/25/2013 0848   MCHC 34.9 09/25/2013 0848   RDW 13.8 09/25/2013 0848   LYMPHSABS 0.9 09/25/2013 0848   MONOABS 0.5 09/25/2013 0848   EOSABS 0.1 09/25/2013 0848   BASOSABS 0.0 09/25/2013 0848     BNP    Component Value Date/Time   PROBNP 365.0* 09/17/2008 1526    Lipid Panel     Component Value Date/Time   CHOL 114 09/25/2013 0848   TRIG 75 09/25/2013 0848   HDL 44 09/25/2013 0848   CHOLHDL 2.6 09/25/2013 0848   VLDL 15 09/25/2013 0848   LDLCALC 55 09/25/2013 0848     RADIOLOGY: No results found.    ASSESSMENT AND PLAN: Mr. Caccamo is and 80 year old years gentleman who is 5 years status post CABG surgery as well as tissue aortic valve replacement surgery. He has a history of hyperlipidemia as well as hypertension    His last echo Doppler assessment demonstrated a competent bioprosthetic aortic valve with normal systolic function with grade 1 diastolic dysfunction. On his CT scan last year he  found to have a subtle small acute nonhemorrhagic infarct in the posterior left corona radiate extending into the posterior limb of the left internal capsule. There was evidence of remote bilateral frontal lobe infarcts and right occipital lobe infarct with encephalomalacia and  prominent small vessel disease type changes. There was no evidence for intracranial hemorrhage. He now is antiplatelet therapy with Plavix.  I have recommended he resume taking  aspirin 81 mg particular with his established CAD for dual antiplatelet therapy  He is not having any anginal symptoms with reference to his CAD.  His blood pressure today is controlled on benazapril, carvedilol and furosemide.  I have recommended a one-year follow-up carotid duplex scan.  I will see him in the office in 6 months for cardiology reevaluation.   Jason Bihari, MD, [facility]  01/06/2014 4:43 PM

## 2014-01-06 NOTE — Patient Instructions (Signed)
Your physician has requested that you have a carotid duplex. This test is an ultrasound of the carotid arteries in your neck. It looks at blood flow through these arteries that supply the brain with blood. Allow one hour for this exam. There are no restrictions or special instructions. This will be done in April.  Your physician wants you to follow-up in: 6 months with Dr. Carmin Richmond will receive a reminder letter in the mail two months in advance. If you don't receive a letter, please call our office to schedule the follow-up appointment.

## 2014-01-17 ENCOUNTER — Other Ambulatory Visit: Payer: Self-pay | Admitting: Cardiovascular Disease

## 2014-01-18 NOTE — Telephone Encounter (Signed)
Rx has been sent to the pharmacy electronically. ° °

## 2014-02-01 ENCOUNTER — Other Ambulatory Visit: Payer: Self-pay | Admitting: Cardiovascular Disease

## 2014-02-14 ENCOUNTER — Other Ambulatory Visit: Payer: Self-pay | Admitting: Cardiovascular Disease

## 2014-02-15 NOTE — Telephone Encounter (Signed)
Rx refill sent to patient pharmacy   

## 2014-03-09 ENCOUNTER — Ambulatory Visit: Payer: Medicare Other | Admitting: Podiatry

## 2014-03-11 ENCOUNTER — Ambulatory Visit (INDEPENDENT_AMBULATORY_CARE_PROVIDER_SITE_OTHER): Payer: Medicare Other | Admitting: Podiatry

## 2014-03-11 DIAGNOSIS — M79673 Pain in unspecified foot: Secondary | ICD-10-CM | POA: Diagnosis not present

## 2014-03-11 DIAGNOSIS — B351 Tinea unguium: Secondary | ICD-10-CM | POA: Diagnosis not present

## 2014-03-11 NOTE — Progress Notes (Signed)
   Subjective:    Patient ID: [name], male    DOB: 1922/02/16, 81 y.o.   MRN: [id number]  HPI Comments: Presents today chief complaint of painful elongated toenails.  Objective: Pulses are palpable bilateral nails are thick, yellow dystrophic onychomycosis and painful palpation.   Assessment: Onychomycosis with pain in limb.  Plan: Treatment of nails in thickness and length as covered service secondary to pain.      Review of Systems     Objective:   Physical Exam        Assessment & Plan:

## 2014-03-21 ENCOUNTER — Other Ambulatory Visit: Payer: Self-pay | Admitting: Family Medicine

## 2014-06-17 ENCOUNTER — Ambulatory Visit (INDEPENDENT_AMBULATORY_CARE_PROVIDER_SITE_OTHER): Payer: Medicare Other

## 2014-06-17 DIAGNOSIS — M79673 Pain in unspecified foot: Secondary | ICD-10-CM | POA: Diagnosis not present

## 2014-06-17 DIAGNOSIS — B351 Tinea unguium: Secondary | ICD-10-CM

## 2014-06-22 NOTE — Progress Notes (Signed)
HPI Presents today chief complaint of painful elongated toenails.  Objective: Pulses are palpable bilateral nails are thick, yellow dystrophic onychomycosis and painful palpation.   Assessment: Onychomycosis with pain in limb.  Plan: Treatment of nails in thickness and length as covered service secondary to pain.  

## 2014-07-25 ENCOUNTER — Other Ambulatory Visit: Payer: Self-pay | Admitting: Family Medicine

## 2014-08-29 ENCOUNTER — Other Ambulatory Visit: Payer: Self-pay | Admitting: Cardiovascular Disease

## 2014-08-30 ENCOUNTER — Other Ambulatory Visit: Payer: Self-pay | Admitting: Cardiovascular Disease

## 2014-08-30 NOTE — Telephone Encounter (Signed)
Rx(s) sent to pharmacy electronically.  

## 2014-09-11 ENCOUNTER — Emergency Department (HOSPITAL_COMMUNITY): Payer: Medicare Other

## 2014-09-11 ENCOUNTER — Emergency Department (HOSPITAL_COMMUNITY)
Admission: EM | Admit: 2014-09-11 | Discharge: 2014-09-11 | Disposition: A | Discharge status: Home / Self Care | Payer: Medicare Other | Attending: Emergency Medicine | Admitting: Emergency Medicine

## 2014-09-11 ENCOUNTER — Encounter (HOSPITAL_COMMUNITY): Payer: Self-pay | Admitting: Emergency Medicine

## 2014-09-11 DIAGNOSIS — R05 Cough: Secondary | ICD-10-CM | POA: Insufficient documentation

## 2014-09-11 DIAGNOSIS — R110 Nausea: Secondary | ICD-10-CM | POA: Insufficient documentation

## 2014-09-11 DIAGNOSIS — E785 Hyperlipidemia, unspecified: Secondary | ICD-10-CM | POA: Insufficient documentation

## 2014-09-11 DIAGNOSIS — Z79899 Other long term (current) drug therapy: Secondary | ICD-10-CM | POA: Insufficient documentation

## 2014-09-11 DIAGNOSIS — Z8673 Personal history of transient ischemic attack (TIA), and cerebral infarction without residual deficits: Secondary | ICD-10-CM | POA: Insufficient documentation

## 2014-09-11 DIAGNOSIS — I10 Essential (primary) hypertension: Secondary | ICD-10-CM | POA: Insufficient documentation

## 2014-09-11 DIAGNOSIS — Z7902 Long term (current) use of antithrombotics/antiplatelets: Secondary | ICD-10-CM | POA: Insufficient documentation

## 2014-09-11 DIAGNOSIS — Z951 Presence of aortocoronary bypass graft: Secondary | ICD-10-CM | POA: Diagnosis not present

## 2014-09-11 DIAGNOSIS — R0682 Tachypnea, not elsewhere classified: Secondary | ICD-10-CM | POA: Diagnosis not present

## 2014-09-11 DIAGNOSIS — R404 Transient alteration of awareness: Secondary | ICD-10-CM | POA: Diagnosis not present

## 2014-09-11 DIAGNOSIS — H269 Unspecified cataract: Secondary | ICD-10-CM | POA: Insufficient documentation

## 2014-09-11 DIAGNOSIS — E119 Type 2 diabetes mellitus without complications: Secondary | ICD-10-CM | POA: Diagnosis not present

## 2014-09-11 DIAGNOSIS — Z87891 Personal history of nicotine dependence: Secondary | ICD-10-CM | POA: Diagnosis not present

## 2014-09-11 DIAGNOSIS — R42 Dizziness and giddiness: Secondary | ICD-10-CM | POA: Diagnosis not present

## 2014-09-11 DIAGNOSIS — R0602 Shortness of breath: Secondary | ICD-10-CM | POA: Diagnosis not present

## 2014-09-11 DIAGNOSIS — I2510 Atherosclerotic heart disease of native coronary artery without angina pectoris: Secondary | ICD-10-CM | POA: Insufficient documentation

## 2014-09-11 DIAGNOSIS — I160 Hypertensive urgency: Secondary | ICD-10-CM

## 2014-09-11 LAB — I-STAT TROPONIN, ED
TROPONIN I, POC: 0.07 ng/mL (ref 0.00–0.08)
TROPONIN I, POC: 0.05 ng/mL (ref 0.00–0.08)

## 2014-09-11 LAB — COMPREHENSIVE METABOLIC PANEL
ALT: 16 U/L — AB (ref 17–63)
AST: 26 U/L (ref 15–41)
Albumin: 3.2 g/dL — ABNORMAL LOW (ref 3.5–5.0)
Alkaline Phosphatase: 44 U/L (ref 38–126)
Anion gap: 5 (ref 5–15)
BILIRUBIN TOTAL: 0.9 mg/dL (ref 0.3–1.2)
BUN: 16 mg/dL (ref 6–20)
CALCIUM: 9.1 mg/dL (ref 8.9–10.3)
CO2: 29 mmol/L (ref 22–32)
CREATININE: 1.11 mg/dL (ref 0.61–1.24)
Chloride: 105 mmol/L (ref 101–111)
GFR calc Af Amer: >60 mL/min (ref >60)
GFR, EST NON AFRICAN AMERICAN: 56 mL/min — AB (ref >60)
GLUCOSE: 128 mg/dL — AB (ref 65–99)
Potassium: 4.4 mmol/L (ref 3.5–5.1)
Sodium: 139 mmol/L (ref 135–145)
TOTAL PROTEIN: 6.2 g/dL — AB (ref 6.5–8.1)

## 2014-09-11 LAB — URINALYSIS, ROUTINE W REFLEX MICROSCOPIC
Bilirubin Urine: NEGATIVE
Glucose, UA: NEGATIVE mg/dL
Hgb urine dipstick: NEGATIVE
KETONES UR: NEGATIVE mg/dL
Leukocytes, UA: NEGATIVE
Nitrite: NEGATIVE
PH: 7 (ref 5.0–8.0)
PROTEIN: NEGATIVE mg/dL
Specific Gravity, Urine: 1.009 (ref 1.005–1.030)
Urobilinogen, UA: 0.2 mg/dL (ref 0.0–1.0)

## 2014-09-11 LAB — CBC
HCT: 38.8 % — ABNORMAL LOW (ref 39.0–52.0)
Hemoglobin: 12.8 g/dL — ABNORMAL LOW (ref 13.0–17.0)
MCH: 30.8 pg (ref 26.0–34.0)
MCHC: 33 g/dL (ref 30.0–36.0)
MCV: 93.3 fL (ref 78.0–100.0)
PLATELETS: 154 10*3/uL (ref 150–400)
RBC: 4.16 MIL/uL — ABNORMAL LOW (ref 4.22–5.81)
RDW: 12.7 % (ref 11.5–15.5)
WBC: 5.4 10*3/uL (ref 4.0–10.5)

## 2014-09-11 LAB — PROTIME-INR
INR: 1.08 (ref 0.00–1.49)
PROTHROMBIN TIME: 14.2 s (ref 11.6–15.2)

## 2014-09-11 MED ORDER — CARVEDILOL 6.25 MG PO TABS
6.2500 mg | ORAL_TABLET | Freq: Once | ORAL | Status: AC
  Administered 2014-09-11: 6.25 mg via ORAL

## 2014-09-11 MED ORDER — BENAZEPRIL HCL 5 MG PO TABS
5.0000 mg | ORAL_TABLET | Freq: Every day | ORAL | Status: DC
  Administered 2014-09-11: 5 mg via ORAL
  Filled 2014-09-11: qty 1

## 2014-09-11 MED ORDER — CARVEDILOL 6.25 MG PO TABS
6.2500 mg | ORAL_TABLET | Freq: Two times a day (BID) | ORAL | Status: DC
  Filled 2014-09-11 (×2): qty 1

## 2014-09-11 NOTE — ED Notes (Signed)
Per EMS: pt from home for eval of dizziness, nausea and HTN that started this morning. Pt with hx of HTN and has taken meds today however EMS noted BP 200/100 upon arrival. Pt with hx of stroke with speech impairment and vision loss in peripheral. EMS noted pt was sob as well, pt denies. Pt denies any pain at this time. Nad noted. No neuro deficits noted.

## 2014-09-11 NOTE — ED Provider Notes (Signed)
CSN: [id number]     Arrival date & time 09/11/14  1218 History   First MD Initiated Contact with Patient 09/11/14 1228     Chief Complaint  Patient presents with  . Nausea  . Hypertension     (Consider location/radiation/quality/duration/timing/severity/associated sxs/prior Treatment) Patient is a 81 y.o. male presenting with hypertension and general illness.  Hypertension This is a chronic problem. The current episode started 1 to 2 hours ago. The problem occurs constantly. The problem has been gradually improving. Associated symptoms include headaches. Pertinent negatives include no chest pain, no abdominal pain and no shortness of breath (pt denies, however family reports he appeared to be breathign harder before). Nothing aggravates the symptoms. Nothing relieves the symptoms. He has tried nothing for the symptoms. The treatment provided no relief.  Illness Location:  "just not feeling right", headache Severity:  Mild Associated symptoms: cough (chronic cough per family, recent signs of aspiration at times with feeding), headaches and nausea (pt denies, family unaware, however pt reported this to EMS)   Associated symptoms: no abdominal pain, no chest pain, no diarrhea, no fever, no rash, no shortness of breath (pt denies, however family reports he appeared to be breathign harder before), no sore throat and no vomiting     Past Medical History  Diagnosis Date  . Diabetes mellitus   . Stroke   . Cataract   . CAD (coronary artery disease)   . Carotid stenosis 09/24/11    right bulb/proximal ICA demonstrates a mild amont of fibrous plaque elevating velocitieswithin the proximal and mid segments of the internal carotid artery. Left CEA demonstrats a trace amount of fibrous plaque with  no evidence of a significant diameter reduction, tortuosity or any other vascular abnormality.  . Hypertension 08/28/09    ECHO- EF 50-55%  . Hyperlipidemia 06/02/08    Nuclear stress test- high risk  scan EF 29% Post stress LV cavity enlargement suggests multi vessel disease.   Past Surgical History  Procedure Laterality Date  . Appendectomy    . Coronary artery bypass graft      + AV replacement  . Carpal tunnel release      Left  . Carotid endarterectomy      with darcon patch angioplasty 03/17/2001  . Cardiac catheterization  06/10/2008  . Tee without cardioversion N/A 08/15/2012    Procedure: TRANSESOPHAGEAL ECHOCARDIOGRAM (TEE);  Surgeon: Pricilla Riffle, MD;  Location: [facility] ENDOSCOPY;  Service: Cardiovascular;  Laterality: N/A;   No family history on file. History  Substance Use Topics  . Smoking status: Former Smoker    Quit date: 02/28/1978  . Smokeless tobacco: Never Used  . Alcohol Use: No    Review of Systems  Constitutional: Negative for fever and appetite change.  HENT: Negative for sore throat.   Eyes: Negative for visual disturbance.  Respiratory: Positive for cough (chronic cough per family, recent signs of aspiration at times with feeding). Negative for shortness of breath (pt denies, however family reports he appeared to be breathign harder before).   Cardiovascular: Negative for chest pain.  Gastrointestinal: Positive for nausea (pt denies, family unaware, however pt reported this to EMS). Negative for vomiting, abdominal pain, diarrhea and constipation.  Genitourinary: Negative for difficulty urinating.  Musculoskeletal: Negative for back pain and neck stiffness.  Skin: Negative for rash.  Neurological: Positive for speech difficulty (unchanged from baseline), light-headedness (unclear if litghtheaded or vertigo) and headaches. Negative for dizziness, syncope, facial asymmetry, weakness and numbness.      Allergies  Review of patient's allergies indicates no known allergies.  Home Medications   Prior to Admission medications   Medication Sig Start Date End Date Taking? Authorizing Provider  atorvastatin (LIPITOR) 80 MG tablet Take 80 mg by mouth daily.  Take a half tablet by mouth daily 07/29/12   Donita Brooks, MD  atorvastatin (LIPITOR) 80 MG tablet TAKE 1 TABLET BY MOUTH EVERY DAY 07/26/14   Donita Brooks, MD  benazepril (LOTENSIN) 10 MG tablet TAKE 1 TABLET BY MOUTH DAILY 12/15/13   Donita Brooks, MD  carvedilol (COREG) 12.5 MG tablet Take 1 tablet (12.5 mg total) by mouth 2 (two) times daily with a meal. 08/30/14   Lennette Bihari, MD  clopidogrel (PLAVIX) 75 MG tablet TAKE 1 TABLET BY MOUTH DAILY 03/22/14   Donita Brooks, MD  furosemide (LASIX) 20 MG tablet Take 1 tablet (20 mg total) by mouth daily. 02/15/14   Lennette Bihari, MD  potassium chloride SA (K-DUR,KLOR-CON) 20 MEQ tablet TAKE 1 TABLET BY MOUTH DAILY 01/18/14   Lennette Bihari, MD  pyridOXINE (VITAMIN B-6) 100 MG tablet Take 100 mg by mouth daily.    Historical Provider, MD  VITAMIN D, CHOLECALCIFEROL, PO Take 1,000 Units by mouth daily.      Historical Provider, MD   BP 185/92 mmHg  Pulse 88  Temp(Src) 97.7 F (36.5 C) (Oral)  Resp 18  Ht 5\' 10"  (1.778 m)  Wt 120 lb (54.432 kg)  BMI 17.22 kg/m2  SpO2 99% Physical Exam  Constitutional: He is oriented to person, place, and time. He appears well-developed and well-nourished. No distress.  HENT:  Head: Normocephalic and atraumatic.  Mouth/Throat: No oropharyngeal exudate.  Eyes: Conjunctivae and EOM are normal. Pupils are equal, round, and reactive to light.  Neck: Normal range of motion.  Cardiovascular: Normal rate, regular rhythm, normal heart sounds and intact distal pulses.  Exam reveals no gallop and no friction rub.   No murmur heard. Pulmonary/Chest: Effort normal and breath sounds normal. No respiratory distress. He has no wheezes. He has no rales.  Mild tachypnea (baseline per family)   Abdominal: Soft. He exhibits no distension. There is no tenderness. There is no guarding.  Musculoskeletal: He exhibits no edema.  Neurological: He is alert and oriented to person, place, and time. He has normal strength.  He displays no tremor. No cranial nerve deficit or sensory deficit. Coordination and gait normal. GCS eye subscore is 4. GCS verbal subscore is 5. GCS motor subscore is 6.  Difficulty finding words (baseline per family)  Normal    Skin: Skin is warm and dry. He is not diaphoretic.  Nursing note and vitals reviewed.   ED Course  Procedures (including critical care time) Labs Review Labs Reviewed - No data to display  Imaging Review No results found.   EKG Interpretation   Date/Time:  Saturday September 11 2014 12:26:53 EDT Ventricular Rate:  87 PR Interval:  206 QRS Duration: 108 QT Interval:  398 QTC Calculation: 479 R Axis:   115 Text Interpretation:  Sinus rhythm Anterior infarct, old Minimal ST  depression, lateral leads Baseline wander Similar to previous tracing.  TECHNICALLY DIFFICULT Confirmed by [facility] MD, Abriana Saltos ([id number]) on  09/11/2014 12:45:16 PM      MDM   Final diagnoses:  None   81 year old male with a history of CAD, CVA, hypertension, diabetes presents with concern of "just not feeling right", headache and hypertension at home. At this time patient reports he is back  to normal and asymptomatic. They deny any recent falls, his neurologic exam is normal, however given patient reported headache with hypertension and age on Plavix, a CT head was done to evaluate for bleed. CT head showed no acute abnormality, signs of known past CVAs. An EKG was done and evaluated by me and while there is artifact limiting its evaluation does not show any signs of ST elevations, and is overall similar to prior. An i-STAT troponin was checked and was negative. Patient denied any chest pain during this episode.  Family reports patient recently had more aspiration events and chest x-ray was done which showed no sign of pneumonia. Electrolytes were within normal limits. Urinalysis was negative.   Patient remained asymptomatic during ED stay, with family and pt reporting he is at baseline, no  shortness of breath, no increased breathing from baseline, and pt requesting to go home.  Delta troponin done to again evaluate for cardiac ischemia and was negative..  BP initially 209/85 on arrival to ED, however decreased spontaneously to 180s/170s and pt given home BP medications (at smaller doses given pt taken AM dose.)  Prior symptoms may indicate hypertensive urgency which resolved.  Recommend close PCP follow up, and return to the ED if symptoms return or worsen.    Alvira Monday, MD 09/11/14 1827

## 2014-09-11 NOTE — Discharge Instructions (Signed)

## 2014-09-16 ENCOUNTER — Encounter: Payer: Self-pay | Admitting: Family Medicine

## 2014-09-16 ENCOUNTER — Encounter: Payer: Self-pay | Admitting: Podiatry

## 2014-09-16 ENCOUNTER — Ambulatory Visit (INDEPENDENT_AMBULATORY_CARE_PROVIDER_SITE_OTHER): Payer: Medicare Other | Admitting: Podiatry

## 2014-09-16 ENCOUNTER — Ambulatory Visit (INDEPENDENT_AMBULATORY_CARE_PROVIDER_SITE_OTHER): Payer: Medicare Other | Admitting: Family Medicine

## 2014-09-16 VITALS — BP 180/80 | HR 78 | Temp 98.2°F | Resp 16 | Ht 65.0 in | Wt 131.0 lb

## 2014-09-16 DIAGNOSIS — I10 Essential (primary) hypertension: Secondary | ICD-10-CM | POA: Diagnosis not present

## 2014-09-16 DIAGNOSIS — B351 Tinea unguium: Secondary | ICD-10-CM | POA: Diagnosis not present

## 2014-09-16 DIAGNOSIS — M79676 Pain in unspecified toe(s): Secondary | ICD-10-CM | POA: Diagnosis not present

## 2014-09-16 MED ORDER — BENAZEPRIL HCL 40 MG PO TABS: 40.0000 mg | ORAL_TABLET | Freq: Every day | ORAL | Status: DC

## 2014-09-16 NOTE — Progress Notes (Signed)
Subjective:    Patient ID: [name], male    DOB: 1921/02/26, 81 y.o.   MRN: [id number]  HPI Patient is a very pleasant 81 year old white male who comes in today for follow-up of his emergency room visit. He went to the emergency room because he was "not feeling right". He was found to have significantly elevated blood pressure at home and in the emergency room with systolic blood pressure ranging 180-200. Patient denies any chest pain shortness of breath or dyspnea on exertion. He denies any headache or blurred vision. History is difficult to obtain due to word finding aphasia from a previous stroke. Here today, the patient states he feels better. On exam today, his blood pressure continues to remain elevated at 180/80. I reviewed the emergency room records, troponins were negative. There were no EKG changes. CT of the brain was normal. CMP and CBC were within normal limits. Past Medical History  Diagnosis Date  . Diabetes mellitus   . Stroke   . Cataract   . CAD (coronary artery disease)   . Carotid stenosis 09/24/11    right bulb/proximal ICA demonstrates a mild amont of fibrous plaque elevating velocitieswithin the proximal and mid segments of the internal carotid artery. Left CEA demonstrats a trace amount of fibrous plaque with  no evidence of a significant diameter reduction, tortuosity or any other vascular abnormality.  . Hypertension 08/28/09    ECHO- EF 50-55%  . Hyperlipidemia 06/02/08    Nuclear stress test- high risk scan EF 29% Post stress LV cavity enlargement suggests multi vessel disease.   Past Surgical History  Procedure Laterality Date  . Appendectomy    . Coronary artery bypass graft      + AV replacement  . Carpal tunnel release      Left  . Carotid endarterectomy      with darcon patch angioplasty 03/17/2001  . Cardiac catheterization  06/10/2008  . Tee without cardioversion N/A 08/15/2012    Procedure: TRANSESOPHAGEAL ECHOCARDIOGRAM (TEE);  Surgeon: Pricilla Riffle, MD;  Location: [facility] ENDOSCOPY;  Service: Cardiovascular;  Laterality: N/A;   Current Outpatient Prescriptions on File Prior to Visit  Medication Sig Dispense Refill  . aspirin EC 81 MG tablet Take 81 mg by mouth daily.    Marland Kitchen atorvastatin (LIPITOR) 80 MG tablet Take 80 mg by mouth daily. Take a half tablet by mouth daily    . carvedilol (COREG) 12.5 MG tablet Take 1 tablet (12.5 mg total) by mouth 2 (two) times daily with a meal. 180 tablet 0  . clopidogrel (PLAVIX) 75 MG tablet TAKE 1 TABLET BY MOUTH DAILY 30 tablet 11  . furosemide (LASIX) 20 MG tablet Take 1 tablet (20 mg total) by mouth daily. 30 tablet 10  . OVER THE COUNTER MEDICATION Take 1 tablet by mouth at bedtime. swisskres stool softner    . potassium chloride SA (K-DUR,KLOR-CON) 20 MEQ tablet TAKE 1 TABLET BY MOUTH DAILY 30 tablet 9  . pyridOXINE (VITAMIN B-6) 100 MG tablet Take 100 mg by mouth daily.    Marland Kitchen VITAMIN D, CHOLECALCIFEROL, PO Take 1,000 Units by mouth daily.       No current facility-administered medications on file prior to visit.   No Known Allergies History   Social History  . Marital Status: Widowed    Spouse Name: N/A  . Number of Children: 2  . Years of Education: 10   Occupational History  . retired    Social History Main Topics  .  Smoking status: Former Smoker    Quit date: 02/28/1978  . Smokeless tobacco: Never Used  . Alcohol Use: No  . Drug Use: No  . Sexual Activity: Not on file   Other Topics Concern  . Not on file   Social History Narrative      Review of Systems  All other systems reviewed and are negative.      Objective:   Physical Exam  Constitutional: He appears well-developed and well-nourished.  Neck: Neck supple. No JVD present.  Cardiovascular: Normal rate, regular rhythm and normal heart sounds.   Pulmonary/Chest: Effort normal and breath sounds normal. No respiratory distress. He has no wheezes. He has no rales.  Abdominal: Soft. Bowel sounds are normal. He  exhibits no distension. There is no tenderness. There is no rebound.  Musculoskeletal: He exhibits no edema.  Lymphadenopathy:    He has no cervical adenopathy.  Vitals reviewed.         Assessment & Plan:  Benign essential HTN  Patient's blood pressure is elevated today. I will increase his benazepril from 10 mg a day to 40 mg a day and recheck his blood pressure in 2 weeks. I believe the majority of his symptoms could be due to heat exhaustion. The patient has turned his air conditioner down at home because he felt cold. He is also wearing a wool jacket/sweater today. It is over 100 outside with 90% humidity. I believe the patient may have had some heat exhaustion causing his symptomatology. I have recommended that his daughter go back and put the patient's thermostat at 72.

## 2014-09-16 NOTE — Progress Notes (Signed)
Patient ID: [name], male   DOB: [birth year], 81 y.o.   MRN: [id number] Complaint:  Visit Type: Patient returns to my office for continued preventative foot care services. Complaint: Patient states" my nails have grown long and thick and become painful to walk and wear shoes" .This patient has diabetes with no foot complication. The patient presents for preventative foot care services. No changes to ROS  Podiatric Exam: Vascular: dorsalis pedis and posterior tibial pulses are palpable bilateral. Capillary return is immediate. Temperature gradient is WNL. Skin turgor WNL  Sensorium: Normal Semmes Weinstein monofilament test. Normal tactile sensation bilaterally. Nail Exam: Pt has thick disfigured discolored nails with subungual debris noted bilateral entire nail hallux through fifth toenails Ulcer Exam: There is no evidence of ulcer or pre-ulcerative changes or infection. Orthopedic Exam: Muscle tone and strength are WNL. No limitations in general ROM. No crepitus or effusions noted. Foot type and digits show no abnormalities. Bony prominences are unremarkable. Skin: No Porokeratosis. No infection or ulcers  Diagnosis:  Onychomycosis, , Pain in right toe, pain in left toes  Treatment & Plan Procedures and Treatment: Consent by patient was obtained for treatment procedures. The patient understood the discussion of treatment and procedures well. All questions were answered thoroughly reviewed. Debridement of mycotic and hypertrophic toenails, 1 through 5 bilateral and clearing of subungual debris. No ulceration, no infection noted.  Return Visit-Office Procedure: Patient instructed to return to the office for a follow up visit 3 months for continued evaluation and treatment.

## 2014-09-28 ENCOUNTER — Ambulatory Visit (INDEPENDENT_AMBULATORY_CARE_PROVIDER_SITE_OTHER): Payer: Medicare Other | Admitting: Physician Assistant

## 2014-09-28 ENCOUNTER — Encounter: Payer: Self-pay | Admitting: Physician Assistant

## 2014-09-28 VITALS — BP 122/88 | HR 69 | Ht 67.0 in | Wt 129.3 lb

## 2014-09-28 DIAGNOSIS — I2510 Atherosclerotic heart disease of native coronary artery without angina pectoris: Secondary | ICD-10-CM | POA: Diagnosis not present

## 2014-09-28 DIAGNOSIS — I2583 Coronary atherosclerosis due to lipid rich plaque: Principal | ICD-10-CM

## 2014-09-28 NOTE — Progress Notes (Signed)
Patient ID: [name], male   DOB: 1921-05-28, 81 y.o.   MRN: [id number]    Date:  09/28/2014   ID:  [name], DOB [birth year], MRN [id number]  PCP:  Jason Grosser, MD  Primary Cardiologist:  [name]  Chief Complaint  Patient presents with  . Follow-up     no chest pain, no shortness of breath, edema-right ankle and feet, no pain in legs, no cramping in legs,no lightheadedness, dizziness-with elevated bp     History of Present Illness: [name] is a 81 y.o. male who is a former patient of Dr. Caprice Hahn who established cardiology care with me on July 21, 2012. He presents to the office for one year cardiology evaluation.  [name] has a history of known coronary as well as valvular disease. In April 2010 was found to have severe coronary artery disease with 95% left main stenosis, segmental 95 and 90% circumflex stenoses, and total occlusion of the proximal RCA with left to right collaterals.  Dr. Tresa Hahn performed the catheterization at that time and he also had a 22 mm peak to peak gradient. He underwent CABG revascularization surgery with a LIMA to the LAD, SVG to the diagonal, SVG to the circumflex, and an SVG to the PDA and underwent tissue valve replacement for his aortic stenosis. The following year he suffered a CVA in May 2011. In 2013 he suffered a TIA with almost complete resolution of symptoms. He does have a prior history of left carotid endarterectomy. He last saw Dr. Clarene Hahn in July 2013.  An echo on 07/29/2012 showed an ejection fraction in the 55-60% range. The possibility for mild basal posterior hypokinesis could not be excluded. He did have grade 1 diastolic dysfunction. His bioprosthetic aortic valve was well-seated. There was no aortic insufficiency. He did have mild left atrial enlargement and mild pulmonary hypertension with estimated PA pressure 40 mm. Mild to moderate TR.  [name] presented to [facility] emergency room on August 13, 2012 and was evaluated by by Dr.  Dawson Hahn with complaints of difficulty walking, right leg heaviness, and worsening slurred speech. He was felt to have acute stroke on MRI and neurology was consulted. His Plavix was continued. On CT scan he was found to have a subtle small acute nonhemorrhagic infarct in the posterior left corona radiata extending into the posterior limb of the left internal capsule. There were also was evidence for remote bilateral frontal lobe infarcts and right occipital lobe infarct with encephalomalacia. There was no evidence for intracranial hemorrhage. A limited subsequent TEE did not show any evidence for left atrial thrombus. There is no PFO by color Doppler, with agitated saline a few bubbles was seen later and a PFO could not be completely excluded. Apparently during that hospitalization he was taken off Lasix.   His last carotid study was in June 2014 which showed 60-79% stenosis involving the right internal carotid artery, 40-59%. She stenosis involving the left internal carotid artery.  He last saw Dr. Tresa Hahn November 2015.   He was recently seen in the emergency room for nausea and hypertension.   Blood pressure was 209/85 on arrival. He is here for follow-up.   His blood pressure is under much better control.   Dr. Brendia Hahn recently increased his benazepril to 40 mg daily  The patient currently denies nausea, vomiting, fever, chest pain, shortness of breath, orthopnea, dizziness, PND, cough, congestion, abdominal pain, hematochezia, melena, lower extremity edema, claudication.  Wt Readings from Last  3 Encounters:  09/28/14 129 lb 5 oz (58.656 kg)  09/16/14 131 lb (59.421 kg)  09/11/14 120 lb (54.432 kg)     Past Medical History  Diagnosis Date  . Diabetes mellitus   . Stroke   . Cataract   . CAD (coronary artery disease)   . Carotid stenosis 09/24/11    right bulb/proximal ICA demonstrates a mild amont of fibrous plaque elevating velocitieswithin the proximal and mid segments of the  internal carotid artery. Left CEA demonstrats a trace amount of fibrous plaque with  no evidence of a significant diameter reduction, tortuosity or any other vascular abnormality.  . Hypertension 08/28/09    ECHO- EF 50-55%  . Hyperlipidemia 06/02/08    Nuclear stress test- high risk scan EF 29% Post stress LV cavity enlargement suggests multi vessel disease.    Current Outpatient Prescriptions  Medication Sig Dispense Refill  . aspirin EC 81 MG tablet Take 81 mg by mouth daily.    Marland Kitchen atorvastatin (LIPITOR) 80 MG tablet Take 80 mg by mouth daily. Take a half tablet by mouth daily    . benazepril (LOTENSIN) 40 MG tablet Take 1 tablet (40 mg total) by mouth daily. 30 tablet 5  . carvedilol (COREG) 12.5 MG tablet Take 1 tablet (12.5 mg total) by mouth 2 (two) times daily with a meal. 180 tablet 0  . clopidogrel (PLAVIX) 75 MG tablet TAKE 1 TABLET BY MOUTH DAILY 30 tablet 11  . furosemide (LASIX) 20 MG tablet Take 1 tablet (20 mg total) by mouth daily. 30 tablet 10  . OVER THE COUNTER MEDICATION Take 1 tablet by mouth at bedtime. swisskres stool softner    . potassium chloride SA (K-DUR,KLOR-CON) 20 MEQ tablet TAKE 1 TABLET BY MOUTH DAILY 30 tablet 9  . pyridOXINE (VITAMIN B-6) 100 MG tablet Take 100 mg by mouth daily.    Marland Kitchen VITAMIN D, CHOLECALCIFEROL, PO Take 1,000 Units by mouth daily.       No current facility-administered medications for this visit.    Allergies:   No Known Allergies  Social History:  The patient  reports that he quit smoking about 36 years ago. He has never used smokeless tobacco. He reports that he does not drink alcohol or use illicit drugs.   Family history:  No family history on file.  ROS:  Please see the history of present illness.  All other systems reviewed and negative.   PHYSICAL EXAM: VS:  BP 122/88 mmHg  Pulse 69  Ht  (1.702 m)  Wt 129 lb 5 oz (58.656 kg)  BMI 20.25 kg/m2 Well nourished, well developed, in no acute distress.   Patient appears  comfortable sitting on the exam table. Respiratory rate is normal HEENT: Pupils are equal round react to light accommodation extraocular movements are intact.  Neck: no JVDNo cervical lymphadenopathy. Cardiac: Regular rate and rhythm without murmurs rubs or gallops. Lungs:   Decreased breath sounds bilaterally probably from poor effort, no wheezing, rhonchi or rales Abd: soft, nontender, positive bowel sounds all quadrants, no hepatosplenomegaly Ext: no lower extremity edema.  2+ radial and dorsalis pedis pulses. Skin: warm and dry Neuro:  Grossly normal  EKG:   Sinus rhythm rate 69 bpm no changes from prior.   ASSESSMENT AND PLAN:  Problem List Items Addressed This Visit    None      essential hypertension   blood pressures well-controlled. Benazepril was recently increased to 40 mg daily. He's also on Coreg 12.5 mg twice a  day and 20 mg of Lasix daily    Coronary artery disease    No complaints of angina.   Hyperlipidemia  Most recent Lipid Panel     Component Value Date/Time   CHOL 114 09/25/2013 0848   TRIG 75 09/25/2013 0848   HDL 44 09/25/2013 0848   CHOLHDL 2.6 09/25/2013 0848   VLDL 15 09/25/2013 0848   LDLCALC 55 09/25/2013 0848    continue Lipitor   bilateral carotid artery disease  Last carotid Dopplers June 2014.   He has another order for November of this year  Summary:  - The vertebral arteries appear patent with antegrade flow. - Findings consistent with 60 - 79 percent stenosis involving the right internal carotid artery. - Findings consistent with 40 - 59 percent stenosis involving the left internal carotid artery. - ICA/CCA ratio: right = 2.14. left = 1.51.     I did discuss DO NOT RESUSCITATE status with the patient's daughter. She believes this was taken care of in his living will and will review it so they know what to do if he has an emergency.

## 2014-09-28 NOTE — Patient Instructions (Signed)
Wilburt Finlay, PA-C, recommends that you schedule a follow-up appointment in 6 months with Dr Tresa Endo. You will receive a reminder letter in the mail two months in advance. If you don't receive a letter, please call our office to schedule the follow-up appointment.

## 2014-09-30 ENCOUNTER — Encounter: Payer: Self-pay | Admitting: Family Medicine

## 2014-09-30 ENCOUNTER — Ambulatory Visit (INDEPENDENT_AMBULATORY_CARE_PROVIDER_SITE_OTHER): Payer: Medicare Other | Admitting: Family Medicine

## 2014-09-30 VITALS — BP 150/68 | HR 78 | Temp 98.7°F | Resp 16 | Ht 65.0 in | Wt 130.0 lb

## 2014-09-30 DIAGNOSIS — I10 Essential (primary) hypertension: Secondary | ICD-10-CM | POA: Diagnosis not present

## 2014-09-30 LAB — BASIC METABOLIC PANEL WITH GFR
BUN: 19 mg/dL (ref 7–25)
CALCIUM: 9.3 mg/dL (ref 8.6–10.3)
CO2: 30 mmol/L (ref 20–31)
Chloride: 107 mmol/L (ref 98–110)
Creat: 1.17 mg/dL — ABNORMAL HIGH (ref 0.70–1.11)
GFR, Est African American: 63 mL/min (ref >=60)
GFR, Est Non African American: 54 mL/min — ABNORMAL LOW (ref >=60)
GLUCOSE: 144 mg/dL — AB (ref 70–99)
POTASSIUM: 5.4 mmol/L — AB (ref 3.5–5.3)
Sodium: 147 mmol/L — ABNORMAL HIGH (ref 135–146)

## 2014-09-30 NOTE — Progress Notes (Signed)
Subjective:    Patient ID: [name], male    DOB: [birth year], 81 y.o.   MRN: [id number]  HPI 09/16/14 Patient is a very pleasant 81 year old white male who comes in today for follow-up of his emergency room visit. He went to the emergency room because he was "not feeling right". He was found to have significantly elevated blood pressure at home and in the emergency room with systolic blood pressure ranging 180-200. Patient denies any chest pain shortness of breath or dyspnea on exertion. He denies any headache or blurred vision. History is difficult to obtain due to word finding aphasia from a previous stroke. Here today, the patient states he feels better. On exam today, his blood pressure continues to remain elevated at 180/80. I reviewed the emergency room records, troponins were negative. There were no EKG changes. CT of the brain was normal. CMP and CBC were within normal limits.  At that time, my plan was: Patient's blood pressure is elevated today. I will increase his benazepril from 10 mg a day to 40 mg a day and recheck his blood pressure in 2 weeks. I believe the majority of his symptoms could be due to heat exhaustion. The patient has turned his air conditioner down at home because he felt cold. He is also wearing a wool jacket/sweater today. It is over 100 outside with 90% humidity. I believe the patient may have had some heat exhaustion causing his symptomatology. I have recommended that his daughter go back and put the patient's thermostat at 72.  09/30/14 Here for recheck.  Patient saw the heart doctor earlier this week and his blood pressure was excellent in the 120 systolic. My nurse checked his blood pressure and it was 150/68 which is excellent, given his 81 y.o.. I rechecked the blood pressure myself in both arms and found to be significantly higher at 180/70. I believe the patient may have some white coat syndrome Past Medical History  Diagnosis Date  . Diabetes mellitus   .  Stroke   . Cataract   . CAD (coronary artery disease)   . Carotid stenosis 09/24/11    right bulb/proximal ICA demonstrates a mild amont of fibrous plaque elevating velocitieswithin the proximal and mid segments of the internal carotid artery. Left CEA demonstrats a trace amount of fibrous plaque with  no evidence of a significant diameter reduction, tortuosity or any other vascular abnormality.  . Hypertension 08/28/09    ECHO- EF 50-55%  . Hyperlipidemia 06/02/08    Nuclear stress test- high risk scan EF 29% Post stress LV cavity enlargement suggests multi vessel disease.   Past Surgical History  Procedure Laterality Date  . Appendectomy    . Coronary artery bypass graft      + AV replacement  . Carpal tunnel release      Left  . Carotid endarterectomy      with darcon patch angioplasty 03/17/2001  . Cardiac catheterization  06/10/2008  . Tee without cardioversion N/A 08/15/2012    Procedure: TRANSESOPHAGEAL ECHOCARDIOGRAM (TEE);  Surgeon: Pricilla Riffle, MD;  Location: [facility] ENDOSCOPY;  Service: Cardiovascular;  Laterality: N/A;   Current Outpatient Prescriptions on File Prior to Visit  Medication Sig Dispense Refill  . aspirin EC 81 MG tablet Take 81 mg by mouth daily.    Marland Kitchen atorvastatin (LIPITOR) 80 MG tablet Take 80 mg by mouth daily. Take a half tablet by mouth daily    . benazepril (LOTENSIN) 40 MG tablet Take 1 tablet (40 mg  total) by mouth daily. 30 tablet 5  . carvedilol (COREG) 12.5 MG tablet Take 1 tablet (12.5 mg total) by mouth 2 (two) times daily with a meal. 180 tablet 0  . clopidogrel (PLAVIX) 75 MG tablet TAKE 1 TABLET BY MOUTH DAILY 30 tablet 11  . furosemide (LASIX) 20 MG tablet Take 1 tablet (20 mg total) by mouth daily. 30 tablet 10  . OVER THE COUNTER MEDICATION Take 1 tablet by mouth at bedtime. swisskres stool softner    . potassium chloride SA (K-DUR,KLOR-CON) 20 MEQ tablet TAKE 1 TABLET BY MOUTH DAILY 30 tablet 9  . pyridOXINE (VITAMIN B-6) 100 MG tablet Take 100  mg by mouth daily.    Marland Kitchen VITAMIN D, CHOLECALCIFEROL, PO Take 1,000 Units by mouth daily.       No current facility-administered medications on file prior to visit.   No Known Allergies Social History   Social History  . Marital Status: Widowed    Spouse Name: N/A  . Number of Children: 2  . Years of Education: 10   Occupational History  . retired    Social History Main Topics  . Smoking status: Former Smoker    Quit date: 02/28/1978  . Smokeless tobacco: Never Used  . Alcohol Use: No  . Drug Use: No  . Sexual Activity: Not on file   Other Topics Concern  . Not on file   Social History Narrative      Review of Systems  All other systems reviewed and are negative.      Objective:   Physical Exam  Constitutional: He appears well-developed and well-nourished.  Neck: Neck supple. No JVD present.  Cardiovascular: Normal rate, regular rhythm and normal heart sounds.   Pulmonary/Chest: Effort normal and breath sounds normal. No respiratory distress. He has no wheezes. He has no rales.  Abdominal: Soft. Bowel sounds are normal. He exhibits no distension. There is no tenderness. There is no rebound.  Musculoskeletal: He exhibits no edema.  Lymphadenopathy:    He has no cervical adenopathy.  Vitals reviewed.         Assessment & Plan:  Benign essential HTN - Plan: BASIC METABOLIC PANEL WITH GFR  I would be willing to accept a blood pressure in the 150 systolic given his 81 y.o.. I have asked him to check his blood pressure randomly at home over the next week and call me with the values. If his blood pressures consistently greater than 150 systolic, I would add amlodipine.

## 2014-10-15 ENCOUNTER — Telehealth: Payer: Self-pay | Admitting: Family Medicine

## 2014-10-15 MED ORDER — AMLODIPINE BESYLATE 10 MG PO TABS: 10.0000 mg | ORAL_TABLET | Freq: Every day | ORAL | Status: DC

## 2014-10-15 NOTE — Telephone Encounter (Signed)
BP Readings - 168/90,159/80,147/74,170/92,177/99,163/90,175/90,139/76  Per Dr. Tanya Nones BP too high and need to be on Amlodipine 10 mg qd.  Pt's dtr aware and med sent to [facility]

## 2014-10-18 ENCOUNTER — Encounter: Payer: Self-pay | Admitting: Family Medicine

## 2014-10-18 ENCOUNTER — Ambulatory Visit (INDEPENDENT_AMBULATORY_CARE_PROVIDER_SITE_OTHER): Payer: Medicare Other | Admitting: Family Medicine

## 2014-10-18 VITALS — BP 158/68 | HR 80 | Temp 98.1°F | Resp 16 | Ht 65.0 in | Wt 131.0 lb

## 2014-10-18 DIAGNOSIS — H6123 Impacted cerumen, bilateral: Secondary | ICD-10-CM

## 2014-10-18 NOTE — Progress Notes (Signed)
Subjective:    Patient ID: [name], male    DOB: November 30, 1921, 81 y.o.   MRN: [id number]  HPI He reports decreased hearing bilaterally. On examination today he has significant cerumen in both auditory canals.  He was also concerned about a bump it was located behind his right ear. Apparently he was much bigger before. I believe the patient may have scratched it off. Today there is a 4-5 mm brown warty macular area that appears to be an irritated seborrheic keratosis. His daughter states that it was much bigger before that he is scratched off the lesion. Past Medical History  Diagnosis Date  . Diabetes mellitus   . Stroke   . Cataract   . CAD (coronary artery disease)   . Carotid stenosis 09/24/11    right bulb/proximal ICA demonstrates a mild amont of fibrous plaque elevating velocitieswithin the proximal and mid segments of the internal carotid artery. Left CEA demonstrats a trace amount of fibrous plaque with  no evidence of a significant diameter reduction, tortuosity or any other vascular abnormality.  . Hypertension 08/28/09    ECHO- EF 50-55%  . Hyperlipidemia 06/02/08    Nuclear stress test- high risk scan EF 29% Post stress LV cavity enlargement suggests multi vessel disease.   Past Surgical History  Procedure Laterality Date  . Appendectomy    . Coronary artery bypass graft      + AV replacement  . Carpal tunnel release      Left  . Carotid endarterectomy      with darcon patch angioplasty 03/17/2001  . Cardiac catheterization  06/10/2008  . Tee without cardioversion N/A 08/15/2012    Procedure: TRANSESOPHAGEAL ECHOCARDIOGRAM (TEE);  Surgeon: Pricilla Riffle, MD;  Location: [facility] ENDOSCOPY;  Service: Cardiovascular;  Laterality: N/A;   Current Outpatient Prescriptions on File Prior to Visit  Medication Sig Dispense Refill  . amLODipine (NORVASC) 10 MG tablet Take 1 tablet (10 mg total) by mouth daily. 30 tablet 3  . aspirin EC 81 MG tablet Take 81 mg by mouth daily.    Marland Kitchen  atorvastatin (LIPITOR) 80 MG tablet Take 80 mg by mouth daily. Take a half tablet by mouth daily    . benazepril (LOTENSIN) 40 MG tablet Take 1 tablet (40 mg total) by mouth daily. 30 tablet 5  . carvedilol (COREG) 12.5 MG tablet Take 1 tablet (12.5 mg total) by mouth 2 (two) times daily with a meal. 180 tablet 0  . clopidogrel (PLAVIX) 75 MG tablet TAKE 1 TABLET BY MOUTH DAILY 30 tablet 11  . furosemide (LASIX) 20 MG tablet Take 1 tablet (20 mg total) by mouth daily. 30 tablet 10  . potassium chloride SA (K-DUR,KLOR-CON) 20 MEQ tablet TAKE 1 TABLET BY MOUTH DAILY 30 tablet 9  . pyridOXINE (VITAMIN B-6) 100 MG tablet Take 100 mg by mouth daily.    Marland Kitchen VITAMIN D, CHOLECALCIFEROL, PO Take 1,000 Units by mouth daily.       No current facility-administered medications on file prior to visit.   No Known Allergies Social History   Social History  . Marital Status: Widowed    Spouse Name: N/A  . Number of Children: 2  . Years of Education: 10   Occupational History  . retired    Social History Main Topics  . Smoking status: Former Smoker    Quit date: 02/28/1978  . Smokeless tobacco: Never Used  . Alcohol Use: No  . Drug Use: No  . Sexual Activity: Not  on file   Other Topics Concern  . Not on file   Social History Narrative      Review of Systems  All other systems reviewed and are negative.      Objective:   Physical Exam  Cardiovascular: Normal rate, regular rhythm and normal heart sounds.   Pulmonary/Chest: Effort normal and breath sounds normal.  Vitals reviewed.         Assessment & Plan:  Cerumen impaction, bilateral  Cerumen impaction was removed bilaterally with irrigation and lavage. I offered the patient cryotherapy to remove the residual remnants of the seborrheic keratosis. The patient elected just to monitor the lesion and return if it starts to grow.

## 2014-11-04 ENCOUNTER — Ambulatory Visit (INDEPENDENT_AMBULATORY_CARE_PROVIDER_SITE_OTHER): Payer: Medicare Other | Admitting: Family Medicine

## 2014-11-04 ENCOUNTER — Ambulatory Visit
Admission: RE | Admit: 2014-11-04 | Discharge: 2014-11-04 | Disposition: A | Discharge status: Home / Self Care | Payer: Medicare Other | Source: Ambulatory Visit | Attending: Family Medicine | Admitting: Family Medicine

## 2014-11-04 ENCOUNTER — Encounter: Payer: Self-pay | Admitting: Family Medicine

## 2014-11-04 VITALS — BP 130/60 | HR 70 | Temp 98.6°F | Resp 20 | Ht 65.0 in | Wt 134.0 lb

## 2014-11-04 DIAGNOSIS — J90 Pleural effusion, not elsewhere classified: Secondary | ICD-10-CM | POA: Diagnosis not present

## 2014-11-04 DIAGNOSIS — J9601 Acute respiratory failure with hypoxia: Secondary | ICD-10-CM

## 2014-11-04 DIAGNOSIS — R0600 Dyspnea, unspecified: Secondary | ICD-10-CM | POA: Diagnosis not present

## 2014-11-04 DIAGNOSIS — J9600 Acute respiratory failure, unspecified whether with hypoxia or hypercapnia: Secondary | ICD-10-CM | POA: Diagnosis not present

## 2014-11-04 LAB — HEMOGLOBIN, FINGERSTICK: HEMOGLOBIN, FINGERSTICK: 11.7 g/dL — AB (ref 14.0–18.0)

## 2014-11-04 NOTE — Progress Notes (Signed)
Subjective:    Patient ID: [name], male    DOB: [birth year], 81 y.o.   MRN: [id number]  HPI Patient is a very pleasant 81 year old white male who presents with dyspnea on exertion. It has been recently worsening over the last few days. Walking to the exam room on room air he is 81%. At rest on 2 L via nasal cannula, his oxygen saturation rises to 97%. Patient denies chest pain. He does have a history of coronary artery disease status post CABG. There is no edema. His weight is up 3 pounds since his last office visit but on physical exam today there is no evidence of fluid overload. He does have faint bibasilar crackles left greater than right. He denies any angina. He denies any orthopnea. He only reports dyspnea on exertion. Past Medical History  Diagnosis Date  . Diabetes mellitus   . Stroke   . Cataract   . CAD (coronary artery disease)   . Carotid stenosis 09/24/11    right bulb/proximal ICA demonstrates a mild amont of fibrous plaque elevating velocitieswithin the proximal and mid segments of the internal carotid artery. Left CEA demonstrats a trace amount of fibrous plaque with  no evidence of a significant diameter reduction, tortuosity or any other vascular abnormality.  . Hypertension 08/28/09    ECHO- EF 50-55%  . Hyperlipidemia 06/02/08    Nuclear stress test- high risk scan EF 29% Post stress LV cavity enlargement suggests multi vessel disease.   Past Surgical History  Procedure Laterality Date  . Appendectomy    . Coronary artery bypass graft      + AV replacement  . Carpal tunnel release      Left  . Carotid endarterectomy      with darcon patch angioplasty 03/17/2001  . Cardiac catheterization  06/10/2008  . Tee without cardioversion N/A 08/15/2012    Procedure: TRANSESOPHAGEAL ECHOCARDIOGRAM (TEE);  Surgeon: Pricilla Riffle, MD;  Location: [facility] ENDOSCOPY;  Service: Cardiovascular;  Laterality: N/A;   Current Outpatient Prescriptions on File Prior to Visit  Medication  Sig Dispense Refill  . amLODipine (NORVASC) 10 MG tablet Take 1 tablet (10 mg total) by mouth daily. 30 tablet 3  . aspirin EC 81 MG tablet Take 81 mg by mouth daily.    Marland Kitchen atorvastatin (LIPITOR) 80 MG tablet Take 80 mg by mouth daily. Take a half tablet by mouth daily    . benazepril (LOTENSIN) 40 MG tablet Take 1 tablet (40 mg total) by mouth daily. 30 tablet 5  . carvedilol (COREG) 12.5 MG tablet Take 1 tablet (12.5 mg total) by mouth 2 (two) times daily with a meal. 180 tablet 0  . clopidogrel (PLAVIX) 75 MG tablet TAKE 1 TABLET BY MOUTH DAILY 30 tablet 11  . furosemide (LASIX) 20 MG tablet Take 1 tablet (20 mg total) by mouth daily. 30 tablet 10  . potassium chloride SA (K-DUR,KLOR-CON) 20 MEQ tablet TAKE 1 TABLET BY MOUTH DAILY 30 tablet 9  . pyridOXINE (VITAMIN B-6) 100 MG tablet Take 100 mg by mouth daily.    Marland Kitchen VITAMIN D, CHOLECALCIFEROL, PO Take 1,000 Units by mouth daily.       No current facility-administered medications on file prior to visit.   No Known Allergies Social History   Social History  . Marital Status: Widowed    Spouse Name: N/A  . Number of Children: 2  . 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 Smoking status: Former Smoker    Quit date: 02/28/1978  . Smokeless tobacco: Never Used  . Alcohol Use: No  . Drug Use: No  . Sexual Activity: Not on file   Other Topics Concern  . Not on file   Social History Narrative      Review of Systems  All other systems reviewed and are negative.      Objective:   Physical Exam  Constitutional: He appears well-developed and well-nourished.  Neck: No JVD present.  Cardiovascular: Normal rate, regular rhythm and normal heart sounds.   Pulmonary/Chest: Effort normal. He has rales.  Abdominal: Soft. Bowel sounds are normal. He exhibits no distension. There is no tenderness. There is no rebound.  Musculoskeletal: He exhibits no edema.  Vitals  reviewed.         Assessment & Plan:  Dyspnea - Plan: Hemoglobin, fingerstick, EKG 12-Lead  Acute respiratory failure with hypoxia - Plan: EKG 12-Lead  Based on his physical exam, I am suspicious the patient has left lower lobe pneumonia. I will check a fingerstick hemoglobin to rule out severe anemia. I will check an EKG. I will also order a chest x-ray. If pneumonia is confirmed, we'll try outpatient treatment with levaquin and oxygen at home as clinically the patient appears well.    Hemoglobin is 11.7 and I do not expect this would explain his dyspnea on exertion. EKG shows Q waves in aVL which are old. It also shows Q waves in the precordial leads which are old. There is some mild ST segment depression and nonspecific changes in the inferior leads which are old compared to an EKG obtained in August 2016. Therefore there is no change in his EKG. I will send the patient for a chest x-ray.  I will arrange for the patient's oxygen at home  2 L via nasal cannula. I will await the results of the chest x-ray.

## 2014-11-08 ENCOUNTER — Ambulatory Visit (INDEPENDENT_AMBULATORY_CARE_PROVIDER_SITE_OTHER): Payer: Medicare Other | Admitting: Family Medicine

## 2014-11-08 ENCOUNTER — Encounter: Payer: Self-pay | Admitting: Family Medicine

## 2014-11-08 VITALS — BP 120/58 | HR 74 | Temp 97.7°F | Resp 16 | Ht 65.0 in | Wt 129.0 lb

## 2014-11-08 DIAGNOSIS — R609 Edema, unspecified: Secondary | ICD-10-CM | POA: Diagnosis not present

## 2014-11-08 DIAGNOSIS — R0600 Dyspnea, unspecified: Secondary | ICD-10-CM

## 2014-11-08 NOTE — Progress Notes (Signed)
Subjective:    Patient ID: [name], [name]    DOB: Aug 15, 1921, 81 y.o.   MRN: [id number]  HPI 11/04/14 Patient is a very pleasant 81 year old white [name] who presents with dyspnea on exertion. It has been recently worsening over the last few days. Walking to the exam room on room air he is 81%. At rest on 2 L via nasal cannula, his oxygen saturation rises to 97%. Patient denies chest pain. He does have a history of coronary artery disease status post CABG. There is no edema. His weight is up 3 pounds since his last office visit but on physical exam today there is no evidence of fluid overload. He does have faint bibasilar crackles left greater than right. He denies any angina. He denies any orthopnea. He only reports dyspnea on exertion.  At that time, my plan was: Based on his physical exam, I am suspicious the patient has left lower lobe pneumonia. I will check a fingerstick hemoglobin to rule out severe anemia. I will check an EKG. I will also order a chest x-ray. If pneumonia is confirmed, we'll try outpatient treatment with levaquin and oxygen at home as clinically the patient appears well.    Hemoglobin is 11.7 and I do not expect this would explain his dyspnea on exertion. EKG shows Q waves in aVL which are old. It also shows Q waves in the precordial leads which are old. There is some mild ST segment depression and nonspecific changes in the inferior leads which are old compared to an EKG obtained in August 2016. Therefore there is no change in his EKG. I will send the patient for a chest x-ray.  I will arrange for the patient's oxygen at home  2 L via nasal cannula. I will await the results of the chest x-ray.  Patient's sats dropped to 81% with exertion on room air. On 2 L via nasal cannula at rest, the patient's saturations were 93%. With exertion on 2 L via nasal cannula, oxygen saturations were 97%.  11/08/14 CXR reveals: Probable interstitial pulmonary edema with small bilateral  pleural Effusions.  My plan was: X-ray shows pulmonary edema and small pleural effusions. This may explain the abnormalities heard on his palm exam. Increase Lasix to 40 mg twice a day and potassium to 20 mEq twice a day. Wear oxygen at home. Recheck on Monday. Patient will likely need an echocardiogram. Go to the hospital if worsening.  Patient states that his breathing is much better since increasing his fluid pills. He has lost 5 pounds since I increased his fluid pills. His dyspnea on exertion has improved dramatically. He is compliant with wearing his oxygen. He denies any chest pain. Past Medical History  Diagnosis Date  . Diabetes mellitus   . Stroke   . Cataract   . CAD (coronary artery disease)   . Carotid stenosis 09/24/11    right bulb/proximal ICA demonstrates a mild amont of fibrous plaque elevating velocitieswithin the proximal and mid segments of the internal carotid artery. Left CEA demonstrats a trace amount of fibrous plaque with  no evidence of a significant diameter reduction, tortuosity or any other vascular abnormality.  . Hypertension 08/28/09    ECHO- EF 50-55%  . Hyperlipidemia 06/02/08    Nuclear stress test- high risk scan EF 29% Post stress LV cavity enlargement suggests multi vessel disease.   Past Surgical History  Procedure Laterality Date  . Appendectomy    . Coronary artery bypass graft      +  AV replacement  . Carpal tunnel release      Left  . Carotid endarterectomy      with darcon patch angioplasty 03/17/2001  . Cardiac catheterization  06/10/2008  . Tee without cardioversion N/A 08/15/2012    Procedure: TRANSESOPHAGEAL ECHOCARDIOGRAM (TEE);  Surgeon: Pricilla Riffle, MD;  Location: [facility] ENDOSCOPY;  Service: Cardiovascular;  Laterality: N/A;   Current Outpatient Prescriptions on File Prior to Visit  Medication Sig Dispense Refill  . amLODipine (NORVASC) 10 MG tablet Take 1 tablet (10 mg total) by mouth daily. 30 tablet 3  . aspirin EC 81 MG tablet Take  81 mg by mouth daily.    Marland Kitchen atorvastatin (LIPITOR) 80 MG tablet Take 80 mg by mouth daily. Take a half tablet by mouth daily    . benazepril (LOTENSIN) 40 MG tablet Take 1 tablet (40 mg total) by mouth daily. 30 tablet 5  . carvedilol (COREG) 12.5 MG tablet Take 1 tablet (12.5 mg total) by mouth 2 (two) times daily with a meal. 180 tablet 0  . clopidogrel (PLAVIX) 75 MG tablet TAKE 1 TABLET BY MOUTH DAILY 30 tablet 11  . furosemide (LASIX) 20 MG tablet Take 1 tablet (20 mg total) by mouth daily. 30 tablet 10  . potassium chloride SA (K-DUR,KLOR-CON) 20 MEQ tablet TAKE 1 TABLET BY MOUTH DAILY 30 tablet 9  . pyridOXINE (VITAMIN B-6) 100 MG tablet Take 100 mg by mouth daily.    Marland Kitchen VITAMIN D, CHOLECALCIFEROL, PO Take 1,000 Units by mouth daily.       No current facility-administered medications on file prior to visit.   No Known Allergies Social History   Social History  . Marital Status: Widowed    Spouse Name: N/A  . Number of Children: 2  . Years of Education: 10   Occupational History  . retired    Social History Main Topics  . Smoking status: Former Smoker    Quit date: 02/28/1978  . Smokeless tobacco: Never Used  . Alcohol Use: No  . Drug Use: No  . Sexual Activity: Not on file   Other Topics Concern  . Not on file   Social History Narrative      Review of Systems  All other systems reviewed and are negative.      Objective:   Physical Exam  Constitutional: He appears well-developed and well-nourished.  Neck: No JVD present.  Cardiovascular: Normal rate, regular rhythm and normal heart sounds.   Pulmonary/Chest: Effort normal. He has rales.  Abdominal: Soft. Bowel sounds are normal. He exhibits no distension. There is no tenderness. There is no rebound.  Musculoskeletal: He exhibits no edema.  Vitals reviewed.         Assessment & Plan:  Dyspnea  Interstitial edema  Discontinue amlodipine. Discontinue potassium. Decrease fluid pill back to 20 mg by  mouth daily. Continue with oxygen. Recheck in one week

## 2014-11-09 ENCOUNTER — Telehealth: Payer: Self-pay | Admitting: Family Medicine

## 2014-11-09 NOTE — Telephone Encounter (Signed)
LMTRC

## 2014-11-09 NOTE — Telephone Encounter (Signed)
mandi from [facility] calling to speak with you regarding this patient  [phone]

## 2014-11-12 NOTE — Telephone Encounter (Signed)
Mandy in office 11/11/14 and this was taken care of at that time.

## 2014-11-15 ENCOUNTER — Encounter: Payer: Self-pay | Admitting: Family Medicine

## 2014-11-15 ENCOUNTER — Ambulatory Visit (INDEPENDENT_AMBULATORY_CARE_PROVIDER_SITE_OTHER): Payer: Medicare Other | Admitting: Family Medicine

## 2014-11-15 VITALS — BP 174/70 | HR 80 | Temp 98.6°F | Resp 18 | Ht 65.0 in | Wt 131.0 lb

## 2014-11-15 DIAGNOSIS — I5021 Acute systolic (congestive) heart failure: Secondary | ICD-10-CM | POA: Diagnosis not present

## 2014-11-15 DIAGNOSIS — Z23 Encounter for immunization: Secondary | ICD-10-CM

## 2014-11-15 DIAGNOSIS — R609 Edema, unspecified: Secondary | ICD-10-CM | POA: Diagnosis not present

## 2014-11-15 MED ORDER — FUROSEMIDE 20 MG PO TABS: 40.0000 mg | ORAL_TABLET | Freq: Every day | ORAL | Status: DC

## 2014-11-15 NOTE — Progress Notes (Signed)
Subjective:    Patient ID: [name], male    DOB: 02-01-1922, 80 y.o.   MRN: [id number]  HPI 11/04/14 Patient is a very pleasant 81 year old white male who presents with dyspnea on exertion. It has been recently worsening over the last few days. Walking to the exam room on room air he is 81%. At rest on 2 L via nasal cannula, his oxygen saturation rises to 97%. Patient denies chest pain. He does have a history of coronary artery disease status post CABG. There is no edema. His weight is up 3 pounds since his last office visit but on physical exam today there is no evidence of fluid overload. He does have faint bibasilar crackles left greater than right. He denies any angina. He denies any orthopnea. He only reports dyspnea on exertion.  At that time, my plan was: Based on his physical exam, I am suspicious the patient has left lower lobe pneumonia. I will check a fingerstick hemoglobin to rule out severe anemia. I will check an EKG. I will also order a chest x-ray. If pneumonia is confirmed, we'll try outpatient treatment with levaquin and oxygen at home as clinically the patient appears well.    Hemoglobin is 11.7 and I do not expect this would explain his dyspnea on exertion. EKG shows Q waves in aVL which are old. It also shows Q waves in the precordial leads which are old. There is some mild ST segment depression and nonspecific changes in the inferior leads which are old compared to an EKG obtained in August 2016. Therefore there is no change in his EKG. I will send the patient for a chest x-ray.  I will arrange for the patient's oxygen at home  2 L via nasal cannula. I will await the results of the chest x-ray.  Patient's sats dropped to 81% with exertion on room air. On 2 L via nasal cannula at rest, the patient's saturations were 93%. With exertion on 2 L via nasal cannula, oxygen saturations were 97%.  11/08/14 CXR revealed: Probable interstitial pulmonary edema with small bilateral  pleural effusions. My plan was: X-ray shows pulmonary edema and small pleural effusions. This may explain the abnormalities heard on his pulm exam. Increase Lasix to 40 mg twice a day and potassium to 20 mEq twice a day. Wear oxygen at home. Recheck on Monday. Patient will likely need an echocardiogram. Go to the hospital if worsening.  Patient states that his breathing is much better since increasing his fluid pills. He has lost 5 pounds since I increased his fluid pills. His dyspnea on exertion has improved dramatically. He is compliant with wearing his oxygen. He denies any chest pain.  At that time, my plan was: Discontinue amlodipine. Discontinue potassium. Decrease fluid pill back to 20 mg by mouth daily. Continue with oxygen. Recheck in one week  11/15/14 Patient is here for follow up.  He has gained 2 lbs since his OV 1 week ago.  O2 sats are 97% on 2 L, 83% on RA at rest.  He feels great on oxygen.  BP has risen substantially off amlodipine.   Wt Readings from Last 3 Encounters:  11/15/14 131 lb (59.421 kg)  11/08/14 129 lb (58.514 kg)  11/04/14 134 lb (60.782 kg)    Past Medical History  Diagnosis Date  . Diabetes mellitus   . Stroke   . Cataract   . CAD (coronary artery disease)   . Carotid stenosis 09/24/11    right  bulb/proximal ICA demonstrates a mild amont of fibrous plaque elevating velocitieswithin the proximal and mid segments of the internal carotid artery. Left CEA demonstrats a trace amount of fibrous plaque with  no evidence of a significant diameter reduction, tortuosity or any other vascular abnormality.  . Hypertension 08/28/09    ECHO- EF 50-55%  . Hyperlipidemia 06/02/08    Nuclear stress test- high risk scan EF 29% Post stress LV cavity enlargement suggests multi vessel disease.   Past Surgical History  Procedure Laterality Date  . Appendectomy    . Coronary artery bypass graft      + AV replacement  . Carpal tunnel release      Left  . Carotid  endarterectomy      with darcon patch angioplasty 03/17/2001  . Cardiac catheterization  06/10/2008  . Tee without cardioversion N/A 08/15/2012    Procedure: TRANSESOPHAGEAL ECHOCARDIOGRAM (TEE);  Surgeon: Pricilla Riffle, MD;  Location: [facility] ENDOSCOPY;  Service: Cardiovascular;  Laterality: N/A;   Current Outpatient Prescriptions on File Prior to Visit  Medication Sig Dispense Refill  . amLODipine (NORVASC) 10 MG tablet Take 1 tablet (10 mg total) by mouth daily. 30 tablet 3  . aspirin EC 81 MG tablet Take 81 mg by mouth daily.    Marland Kitchen atorvastatin (LIPITOR) 80 MG tablet Take 80 mg by mouth daily. Take a half tablet by mouth daily    . benazepril (LOTENSIN) 40 MG tablet Take 1 tablet (40 mg total) by mouth daily. 30 tablet 5  . carvedilol (COREG) 12.5 MG tablet Take 1 tablet (12.5 mg total) by mouth 2 (two) times daily with a meal. 180 tablet 0  . clopidogrel (PLAVIX) 75 MG tablet TAKE 1 TABLET BY MOUTH DAILY 30 tablet 11  . furosemide (LASIX) 20 MG tablet Take 1 tablet (20 mg total) by mouth daily. 30 tablet 10  . potassium chloride SA (K-DUR,KLOR-CON) 20 MEQ tablet TAKE 1 TABLET BY MOUTH DAILY 30 tablet 9  . pyridOXINE (VITAMIN B-6) 100 MG tablet Take 100 mg by mouth daily.    Marland Kitchen VITAMIN D, CHOLECALCIFEROL, PO Take 1,000 Units by mouth daily.       No current facility-administered medications on file prior to visit.   No Known Allergies Social History   Social History  . Marital Status: Widowed    Spouse Name: N/A  . Number of Children: 2  . Years of Education: 10   Occupational History  . retired    Social History Main Topics  . Smoking status: Former Smoker    Quit date: 02/28/1978  . Smokeless tobacco: Never Used  . Alcohol Use: No  . Drug Use: No  . Sexual Activity: Not on file   Other Topics Concern  . Not on file   Social History Narrative      Review of Systems  All other systems reviewed and are negative.      Objective:   Physical Exam  Constitutional: He  appears well-developed and well-nourished.  Neck: No JVD present.  Cardiovascular: Normal rate, regular rhythm and normal heart sounds.   Pulmonary/Chest: Effort normal. He has rales.  Abdominal: Soft. Bowel sounds are normal. He exhibits no distension. There is no tenderness. There is no rebound.  Musculoskeletal: He exhibits no edema.  Vitals reviewed.         Assessment & Plan:  Edema  CHF (congestive heart failure), NYHA class III, acute, systolic - Plan: furosemide (LASIX) 20 MG tablet  Increase lasix to 40 mg poqday.  Stay off potassium and recheck BMP in 1 month.  Resume amlodipine 5 mg poqday.  Continue O2 2 L via [place].  Recheck in 1 month.

## 2014-11-15 NOTE — Addendum Note (Signed)
Addended by: Legrand Rams B on: 11/15/2014 02:44 PM   Modules accepted: Orders

## 2014-12-16 ENCOUNTER — Encounter: Payer: Self-pay | Admitting: Family Medicine

## 2014-12-16 ENCOUNTER — Ambulatory Visit (INDEPENDENT_AMBULATORY_CARE_PROVIDER_SITE_OTHER): Payer: Medicare Other | Admitting: Family Medicine

## 2014-12-16 VITALS — BP 122/58 | HR 78 | Temp 98.5°F | Resp 20 | Ht 65.0 in | Wt 130.0 lb

## 2014-12-16 DIAGNOSIS — I5021 Acute systolic (congestive) heart failure: Secondary | ICD-10-CM

## 2014-12-16 DIAGNOSIS — I10 Essential (primary) hypertension: Secondary | ICD-10-CM

## 2014-12-16 DIAGNOSIS — R601 Generalized edema: Secondary | ICD-10-CM

## 2014-12-16 LAB — BASIC METABOLIC PANEL WITHOUT GFR
BUN: 35 mg/dL — ABNORMAL HIGH (ref 7–25)
CO2: 34 mmol/L — ABNORMAL HIGH (ref 20–31)
Calcium: 8.9 mg/dL (ref 8.6–10.3)
Chloride: 102 mmol/L (ref 98–110)
Creat: 1.3 mg/dL — ABNORMAL HIGH (ref 0.70–1.11)
GFR, Est African American: 55 mL/min — ABNORMAL LOW
GFR, Est Non African American: 48 mL/min — ABNORMAL LOW
Glucose, Bld: 149 mg/dL — ABNORMAL HIGH (ref 70–99)
Potassium: 4 mmol/L (ref 3.5–5.3)
Sodium: 144 mmol/L (ref 135–146)

## 2014-12-16 NOTE — Progress Notes (Signed)
Subjective:    Patient ID: [name], male    DOB: 1921/11/29, 81 y.o.   MRN: [id number]  HPI 11/04/14 Patient is a very pleasant 81 year old white male who presents with dyspnea on exertion. It has been recently worsening over the last few days. Walking to the exam room on room air he is 81%. At rest on 2 L via nasal cannula, his oxygen saturation rises to 97%. Patient denies chest pain. He does have a history of coronary artery disease status post CABG. There is no edema. His weight is up 3 pounds since his last office visit but on physical exam today there is no evidence of fluid overload. He does have faint bibasilar crackles left greater than right. He denies any angina. He denies any orthopnea. He only reports dyspnea on exertion.  At that time, my plan was: Based on his physical exam, I am suspicious the patient has left lower lobe pneumonia. I will check a fingerstick hemoglobin to rule out severe anemia. I will check an EKG. I will also order a chest x-ray. If pneumonia is confirmed, we'll try outpatient treatment with levaquin and oxygen at home as clinically the patient appears well.    Hemoglobin is 11.7 and I do not expect this would explain his dyspnea on exertion. EKG shows Q waves in aVL which are old. It also shows Q waves in the precordial leads which are old. There is some mild ST segment depression and nonspecific changes in the inferior leads which are old compared to an EKG obtained in August 2016. Therefore there is no change in his EKG. I will send the patient for a chest x-ray.  I will arrange for the patient's oxygen at home  2 L via nasal cannula. I will await the results of the chest x-ray.  Patient's sats dropped to 81% with exertion on room air. On 2 L via nasal cannula at rest, the patient's saturations were 93%. With exertion on 2 L via nasal cannula, oxygen saturations were 97%.  11/08/14 CXR revealed: Probable interstitial pulmonary edema with small bilateral  pleural effusions. My plan was: X-ray shows pulmonary edema and small pleural effusions. This may explain the abnormalities heard on his pulm exam. Increase Lasix to 40 mg twice a day and potassium to 20 mEq twice a day. Wear oxygen at home. Recheck on Monday. Patient will likely need an echocardiogram. Go to the hospital if worsening.  Patient states that his breathing is much better since increasing his fluid pills. He has lost 5 pounds since I increased his fluid pills. His dyspnea on exertion has improved dramatically. He is compliant with wearing his oxygen. He denies any chest pain.  At that time, my plan was: Discontinue amlodipine. Discontinue potassium. Decrease fluid pill back to 20 mg by mouth daily. Continue with oxygen. Recheck in one week  11/15/14 Patient is here for follow up.  He has gained 2 lbs since his OV 1 week ago.  O2 sats are 97% on 2 L, 83% on RA at rest.  He feels great on oxygen.  BP has risen substantially off amlodipine.  At that time, my plan was: Increase lasix to 40 mg poqday.  Stay off potassium and recheck BMP in 1 month.  Resume amlodipine 5 mg poqday.  Continue O2 2 L via [place].  Recheck in 1 month. Wt Readings from Last 3 Encounters:  12/16/14 130 lb (58.968 kg)  11/15/14 131 lb (59.421 kg)  11/08/14 129 lb (58.514  kg)   12/16/14 Patient is here today for follow-up. His weight is stable since last office visit at 130 pounds. He has no pitting edema in his legs. There are no rails that I can appreciate in his lungs. He states that his shortness of breath is much improved. He denies any dyspnea at rest. He denies any dyspnea on exertion. Overall he is doing well and his blood pressure is much better today at 122/58. Clinically he seems to be doing very well  Past Medical History  Diagnosis Date  . Diabetes mellitus   . Stroke (HCC)   . Cataract   . CAD (coronary artery disease)   . Carotid stenosis 09/24/11    right bulb/proximal ICA demonstrates a mild amont  of fibrous plaque elevating velocitieswithin the proximal and mid segments of the internal carotid artery. Left CEA demonstrats a trace amount of fibrous plaque with  no evidence of a significant diameter reduction, tortuosity or any other vascular abnormality.  . Hypertension 08/28/09    ECHO- EF 50-55%  . Hyperlipidemia 06/02/08    Nuclear stress test- high risk scan EF 29% Post stress LV cavity enlargement suggests multi vessel disease.   Past Surgical History  Procedure Laterality Date  . Appendectomy    . Coronary artery bypass graft      + AV replacement  . Carpal tunnel release      Left  . Carotid endarterectomy      with darcon patch angioplasty 03/17/2001  . Cardiac catheterization  06/10/2008  . Tee without cardioversion N/A 08/15/2012    Procedure: TRANSESOPHAGEAL ECHOCARDIOGRAM (TEE);  Surgeon: Pricilla Riffle, MD;  Location: [facility] ENDOSCOPY;  Service: Cardiovascular;  Laterality: N/A;   Current Outpatient Prescriptions on File Prior to Visit  Medication Sig Dispense Refill  . amLODipine (NORVASC) 10 MG tablet Take 1 tablet (10 mg total) by mouth daily. 30 tablet 3  . aspirin EC 81 MG tablet Take 81 mg by mouth daily.    Marland Kitchen atorvastatin (LIPITOR) 80 MG tablet Take 80 mg by mouth daily. Take a half tablet by mouth daily    . benazepril (LOTENSIN) 40 MG tablet Take 1 tablet (40 mg total) by mouth daily. 30 tablet 5  . carvedilol (COREG) 12.5 MG tablet Take 1 tablet (12.5 mg total) by mouth 2 (two) times daily with a meal. 180 tablet 0  . clopidogrel (PLAVIX) 75 MG tablet TAKE 1 TABLET BY MOUTH DAILY 30 tablet 11  . furosemide (LASIX) 20 MG tablet Take 2 tablets (40 mg total) by mouth daily. 60 tablet 11  . potassium chloride SA (K-DUR,KLOR-CON) 20 MEQ tablet TAKE 1 TABLET BY MOUTH DAILY 30 tablet 9  . pyridOXINE (VITAMIN B-6) 100 MG tablet Take 100 mg by mouth daily.    Marland Kitchen VITAMIN D, CHOLECALCIFEROL, PO Take 1,000 Units by mouth daily.       No current facility-administered  medications on file prior to visit.   No Known Allergies Social History   Social History  . Marital Status: Widowed    Spouse Name: N/A  . Number of Children: 2  . Years of Education: 10   Occupational History  . retired    Social History Main Topics  . Smoking status: Former Smoker    Quit date: 02/28/1978  . Smokeless tobacco: Never Used  . Alcohol Use: No  . Drug Use: No  . Sexual Activity: Not on file   Other Topics Concern  . Not on file   Social History  Narrative      Review of Systems  All other systems reviewed and are negative.      Objective:   Physical Exam  Constitutional: He appears well-developed and well-nourished.  Neck: No JVD present.  Cardiovascular: Normal rate, regular rhythm and normal heart sounds.   Pulmonary/Chest: Effort normal.  Abdominal: Soft. Bowel sounds are normal. He exhibits no distension. There is no tenderness. There is no rebound.  Musculoskeletal: He exhibits no edema.  Vitals reviewed.         Assessment & Plan:  Benign essential HTN - Plan: BASIC METABOLIC PANEL WITH GFR  CHF (congestive heart failure), NYHA class III, acute, systolic (HCC)  Generalized edema  Clinically, the patient's medication regimen seems to be perfect. I will check his BMP to monitor his renal function and potassium. If stable I will continue Lasix 40 mg every day and recheck the patient back in 3 months

## 2014-12-17 ENCOUNTER — Telehealth: Payer: Self-pay | Admitting: Family Medicine

## 2014-12-17 NOTE — Telephone Encounter (Signed)
error 

## 2014-12-23 ENCOUNTER — Other Ambulatory Visit: Payer: Self-pay | Admitting: Cardiovascular Disease

## 2015-01-04 ENCOUNTER — Ambulatory Visit (INDEPENDENT_AMBULATORY_CARE_PROVIDER_SITE_OTHER): Payer: Medicare Other | Admitting: Podiatry

## 2015-01-04 ENCOUNTER — Encounter: Payer: Self-pay | Admitting: Podiatry

## 2015-01-04 DIAGNOSIS — M79676 Pain in unspecified toe(s): Secondary | ICD-10-CM | POA: Diagnosis not present

## 2015-01-04 DIAGNOSIS — B351 Tinea unguium: Secondary | ICD-10-CM

## 2015-01-04 NOTE — Progress Notes (Signed)
Patient ID: [name], male   DOB: 03/26/1921, 81 y.o.   MRN: [id number] Complaint:  Visit Type: Patient returns to my office for continued preventative foot care services. Complaint: Patient states" my nails have grown long and thick and become painful to walk and wear shoes" .This patient has diabetes with no foot complication. The patient presents for preventative foot care services. No changes to ROS  Podiatric Exam: Vascular: dorsalis pedis and posterior tibial pulses are palpable bilateral. Capillary return is immediate. Temperature gradient is WNL. Skin turgor WNL  Sensorium: Normal Semmes Weinstein monofilament test. Normal tactile sensation bilaterally. Nail Exam: Pt has thick disfigured discolored nails with subungual debris noted bilateral entire nail hallux through fifth toenails Ulcer Exam: There is no evidence of ulcer or pre-ulcerative changes or infection. Orthopedic Exam: Muscle tone and strength are WNL. No limitations in general ROM. No crepitus or effusions noted. Foot type and digits show no abnormalities. Bony prominences are unremarkable. Skin: No Porokeratosis. No infection or ulcers  Diagnosis:  Onychomycosis, , Pain in right toe, pain in left toes  Treatment & Plan Procedures and Treatment: Consent by patient was obtained for treatment procedures. The patient understood the discussion of treatment and procedures well. All questions were answered thoroughly reviewed. Debridement of mycotic and hypertrophic toenails, 1 through 5 bilateral and clearing of subungual debris. No ulceration, no infection noted.  Return Visit-Office Procedure: Patient instructed to return to the office for a follow up visit 3 months for continued evaluation and treatment. 

## 2015-01-05 DIAGNOSIS — J9601 Acute respiratory failure with hypoxia: Secondary | ICD-10-CM | POA: Diagnosis not present

## 2015-01-05 DIAGNOSIS — R0600 Dyspnea, unspecified: Secondary | ICD-10-CM | POA: Diagnosis not present

## 2015-02-04 DIAGNOSIS — R0600 Dyspnea, unspecified: Secondary | ICD-10-CM | POA: Diagnosis not present

## 2015-02-04 DIAGNOSIS — J9601 Acute respiratory failure with hypoxia: Secondary | ICD-10-CM | POA: Diagnosis not present

## 2015-03-07 DIAGNOSIS — R0600 Dyspnea, unspecified: Secondary | ICD-10-CM | POA: Diagnosis not present

## 2015-03-07 DIAGNOSIS — J9601 Acute respiratory failure with hypoxia: Secondary | ICD-10-CM | POA: Diagnosis not present

## 2015-03-11 ENCOUNTER — Other Ambulatory Visit: Payer: Self-pay | Admitting: Family Medicine

## 2015-03-17 ENCOUNTER — Ambulatory Visit (INDEPENDENT_AMBULATORY_CARE_PROVIDER_SITE_OTHER): Payer: Medicare Other | Admitting: Cardiovascular Disease

## 2015-03-17 ENCOUNTER — Encounter: Payer: Self-pay | Admitting: Cardiovascular Disease

## 2015-03-17 VITALS — BP 120/58 | HR 73 | Ht 65.0 in | Wt 135.0 lb

## 2015-03-17 DIAGNOSIS — Z954 Presence of other heart-valve replacement: Secondary | ICD-10-CM

## 2015-03-17 DIAGNOSIS — I2510 Atherosclerotic heart disease of native coronary artery without angina pectoris: Secondary | ICD-10-CM

## 2015-03-17 DIAGNOSIS — I739 Peripheral vascular disease, unspecified: Secondary | ICD-10-CM

## 2015-03-17 DIAGNOSIS — I10 Essential (primary) hypertension: Secondary | ICD-10-CM | POA: Diagnosis not present

## 2015-03-17 DIAGNOSIS — E785 Hyperlipidemia, unspecified: Secondary | ICD-10-CM

## 2015-03-17 DIAGNOSIS — I779 Disorder of arteries and arterioles, unspecified: Secondary | ICD-10-CM | POA: Diagnosis not present

## 2015-03-17 DIAGNOSIS — Z9889 Other specified postprocedural states: Secondary | ICD-10-CM

## 2015-03-17 DIAGNOSIS — Z952 Presence of prosthetic heart valve: Secondary | ICD-10-CM

## 2015-03-17 DIAGNOSIS — I2583 Coronary atherosclerosis due to lipid rich plaque: Secondary | ICD-10-CM

## 2015-03-17 NOTE — Patient Instructions (Signed)
Your physician has requested that you have an echocardiogram. Echocardiography is a painless test that uses sound waves to create images of your heart. It provides your doctor with information about the size and shape of your heart and how well your heart's chambers and valves are working. This procedure takes approximately one hour. There are no restrictions for this procedure.  Your physician wants you to follow-up in: 1 YEAR OR SOONER IF NEEDED You will receive a reminder letter in the mail two months in advance. If you don't receive a letter, please call our office to schedule the follow-up appointment.  If you need a refill on your cardiac medications before your next appointment, please call your pharmacy.

## 2015-03-17 NOTE — Progress Notes (Signed)
Patient ID: [name], male   DOB: [birth year], 82 y.o.   MRN: [id number]    Primary M.D.: Dr. Jenna Luo  HPI: Khyre Germond Kirchner, is a 82 y.o. male who is a former patient of Dr. Aldona Bar  presents to the office for a 14 month  cardiology evaluation.  Mr. Lonon has a history of known coronary as well as valvular disease. In April 2010 was found to have severe coronary artery disease with 95% left main stenosis, segmental 95 and 90% circumflex stenoses, and total occlusion of the proximal RCA with left to right collaterals. I  Performed cardiac catheterization at that time and he also had a 22 mm peak to peak gradient. He underwent CABG revascularization surgery with a LIMA to the LAD, SVG to the diagonal, SVG to the circumflex, and an SVG to the PDA and underwent tissue valve replacement for his aortic stenosis. The following year he suffered a CVA in May 2011.  In 2013 he suffered a TIA with almost complete resolution of symptoms. He had undergone prior  left carotid endarterectomy. He last saw Dr. Rex Kras in July 2013. When I saw him initially he denied any recent chest pain. He did note ankle swelling right greater than left and I adjusted his diuretic therapy. An echo Doppler study on 07/29/2012 showed an ejection fraction in the 55-60% range. The possibility for mild basal posterior hypokinesis could not be excluded. He did have grade 1 diastolic dysfunction. His bioprosthetic aortic valve was well-seated. There was no aortic insufficiency. He did have mild left atrial enlargement and mild pulmonary hypertension with estimated PA pressure 40 mm. Mild to moderate TR.  Mr. Rummell  presented to [facility] emergency room on August 13, 2012 and was evaluated by by Dr. Nira Conn with complaints of difficulty walking, right leg heaviness, and worsening slurred speech. He was felt to have acute stroke on MRI and neurology was consulted. His Plavix was continued. On CT scan he was found to have a subtle  small acute nonhemorrhagic infarct in the posterior left corona radiata extending into the posterior limb of the left internal capsule.  There were also was evidence for remote bilateral frontal lobe infarcts and right occipital lobe infarct with encephalomalacia.  There was no evidence for intracranial hemorrhage. A limited subsequent TEE did not show any evidence for left atrial thrombus. There is no PFO by color Doppler, with agitated saline a few bubbles was seen later and a PFO could not be completely excluded. During that hospitalization he was taken off Lasix.   His last carotid study was in June 2014 which showed 60-79% stenosis involving the right internal carotid artery, 40-59%.  She stenosis involving the left internal carotid artery.  Over the past year, he  Has remained fairly active.   His vision is poor following his stroke and he does not dry. He denies any episodes of chest pain, palpitations, presyncope or syncope or episodes of CHF. He is living by himself, but he lives next door to his daughter who cares for him.  He presents for evaluation  Past Medical History  Diagnosis Date  . Diabetes mellitus   . Stroke ([place])   . Cataract   . CAD (coronary artery disease)   . Carotid stenosis 09/24/11    right bulb/proximal ICA demonstrates a mild amont of fibrous plaque elevating velocitieswithin the proximal and mid segments of the internal carotid artery. Left CEA demonstrats a trace amount of fibrous plaque with  no evidence of a significant diameter reduction, tortuosity or any other vascular abnormality.  . Hypertension 08/28/09    ECHO- EF 50-55%  . Hyperlipidemia 06/02/08    Nuclear stress test- high risk scan EF 29% Post stress LV cavity enlargement suggests multi vessel disease.    Past Surgical History  Procedure Laterality Date  . Appendectomy    . Coronary artery bypass graft      + AV replacement  . Carpal tunnel release      Left  . Carotid endarterectomy      with  darcon patch angioplasty 03/17/2001  . Cardiac catheterization  06/10/2008  . Tee without cardioversion N/A 08/15/2012    Procedure: TRANSESOPHAGEAL ECHOCARDIOGRAM (TEE);  Surgeon: Fay Records, MD;  Location: [facility] ENDOSCOPY;  Service: Cardiovascular;  Laterality: N/A;    No Known Allergies  Current Outpatient Prescriptions  Medication Sig Dispense Refill  . amLODipine (NORVASC) 10 MG tablet Take 1 tablet (10 mg total) by mouth daily. 30 tablet 3  . aspirin EC 81 MG tablet Take 81 mg by mouth daily.    Marland Kitchen atorvastatin (LIPITOR) 80 MG tablet Take 80 mg by mouth daily. Take a half tablet by mouth daily    . benazepril (LOTENSIN) 40 MG tablet TAKE 1 TABLET(40 MG) BY MOUTH DAILY 30 tablet 11  . carvedilol (COREG) 12.5 MG tablet TAKE 1 TABLET BY MOUTH TWICE DAILY WITH A MEAL 180 tablet 2  . clopidogrel (PLAVIX) 75 MG tablet TAKE 1 TABLET BY MOUTH EVERY DAY 30 tablet 11  . furosemide (LASIX) 20 MG tablet Take 2 tablets (40 mg total) by mouth daily. 60 tablet 11  . pyridOXINE (VITAMIN B-6) 100 MG tablet Take 100 mg by mouth daily.    Marland Kitchen VITAMIN D, CHOLECALCIFEROL, PO Take 1,000 Units by mouth daily.       No current facility-administered medications for this visit.   Social he is widowed he remains relatively active he has 2 children. No alcohol tobacco use.  ROS General: Negative; No fevers, chills, or night sweats;  HEENT: Negative; No changes in vision or hearing, sinus congestion, difficulty swallowing Pulmonary: Negative; No cough, wheezing, shortness of breath, hemoptysis Cardiovascular: Negative; No chest pain, presyncope, syncope, palpitations GI: Negative; No nausea, vomiting, diarrhea, or abdominal pain GU: Negative; No dysuria, hematuria, or difficulty voiding Musculoskeletal: Negative; no myalgias, joint pain, or weakness Hematologic/Oncology: Negative; no easy bruising, bleeding Endocrine: Negative; no heat/cold intolerance; no diabetes Neuro: see history of present illness with  remote infarct.; no changes in balance, headaches Skin: Negative; No rashes or skin lesions Psychiatric: Negative; No behavioral problems, depression Sleep: Negative; No snoring, daytime sleepiness, hypersomnolence, bruxism, restless legs, hypnogognic hallucinations, no cataplexy Other comprehensive 14 point system review is negative.   PE BP 120/58 mmHg  Pulse 73  Ht 5' 5" (1.651 m)  Wt 135 lb (61.236 kg)  BMI 22.47 kg/m2   Wt Readings from Last 3 Encounters:  03/17/15 135 lb (61.236 kg)  12/16/14 130 lb (58.968 kg)  11/15/14 131 lb (59.421 kg)   General: Alert, oriented, no distress.  HEENT: Normocephalic, atraumatic. Pupils round and reactive; sclera anicteric;  Nose without nasal septal hypertrophy Mouth/Parynx benign; Mallinpatti scale 3 Neck: No JVD, soft carotid bruits  bilaterally Lungs: clear to ausculatation and percussion; no wheezing or rales Heart: RRR, s1 s2 normal with a 2/6 systolic murmur in aortic region with faint aortic insufficiency  Abdomen: soft, nontender; no hepatosplenomehaly, BS+; abdominal aorta nontender and not dilated by palpation. Pulses 2+  Extremities: trace bilateral ankle swelling, Homan's sign negative  Neurologic: grossly nonfocal Psychological: Normal mood.  ECG (independently read by me):  Normal sinus rhythm at 73. QRS complex V1 V2. Nonspecific T changes inferiorly.  November 2015ECG (independently read by me):  Normal sinus rhythm with PAC, normal intervals.  Poor progression V1 through V3.  Prior July 2014 ECG: Sinus rhythm at 69 beats per minute with poor progression anteriorly V1 through V3. No ectopy.  LABS:  BMP Latest Ref Rng 12/16/2014 09/30/2014 09/11/2014  Glucose 70 - 99 mg/dL 149(H) 144(H) 128(H)  BUN 7 - 25 mg/dL 35(H) 19 16  Creatinine 0.70 - 1.11 mg/dL 1.30(H) 1.17(H) 1.11  Sodium 135 - 146 mmol/L 144 147(H) 139  Potassium 3.5 - 5.3 mmol/L 4.0 5.4(H) 4.4  Chloride 98 - 110 mmol/L 102 107 105  CO2 20 - 31 mmol/L 34(H)  30 29  Calcium 8.6 - 10.3 mg/dL 8.9 9.3 9.1   Hepatic Function Latest Ref Rng 09/11/2014 09/25/2013 08/29/2012  Total Protein 6.5 - 8.1 g/dL 6.2(L) 6.2 6.3  Albumin 3.5 - 5.0 g/dL 3.2(L) 3.7 3.8  AST 15 - 41 U/L _0 ALT 17 - 63 U/L 16(L) 14 13  Alk Phosphatase 38 - 126 U/L 44 48 47  Total Bilirubin 0.3 - 1.2 mg/dL 0.9 0.7 0.4  Bilirubin, Direct 0.0 - 0.3 mg/dL - - -   CBC Latest Ref Rng 09/11/2014 09/25/2013 08/29/2012  WBC 4.0 - 10.5 K/uL 5.4 6.0 6.3  Hemoglobin 13.0 - 17.0 g/dL 12.8(L) 13.7 12.9(L)  Hematocrit 39.0 - 52.0 % 38.8(L) 39.3 38.6(L)  Platelets 150 - 400 K/uL 154 161 201   Lab Results  Component Value Date   MCV 93.3 09/11/2014   MCV 89.1 09/25/2013   MCV 91.5 08/29/2012   Lab Results  Component Value Date   TSH 1.609 09/25/2013   Lab Results  Component Value Date   HGBA1C 5.9* 09/25/2013   Lipid Panel     Component Value Date/Time   CHOL 114 09/25/2013 0848   TRIG 75 09/25/2013 0848   HDL 44 09/25/2013 0848   CHOLHDL 2.6 09/25/2013 0848   VLDL 15 09/25/2013 0848   LDLCALC 55 09/25/2013 0848     RADIOLOGY: No results found.    ASSESSMENT AND PLAN: Mr. Liera is a 82 year old years gentleman who is status post CABG surgery as well as tissue aortic valve replacement surgery in 2010.  He has a history of hyperlipidemia as well as hypertension    His last echo Doppler assessment in 2014 demonstrated a competent bioprosthetic aortic valve with normal systolic function with grade 1 diastolic dysfunction. On his CT scan 2 years ago he was  found to have a subtle small acute nonhemorrhagic infarct in the posterior left corona radiate extending into the posterior limb of the left internal capsule. There was evidence of remote bilateral frontal lobe infarcts and right occipital lobe infarct with encephalomalacia and  prominent small vessel disease type changes. There was no evidence for intracranial hemorrhage. He  is on dual antiplatelet therapy with Plavix/ASA.   Presently, he is not having anginal symptoms.  His blood pressure today is controlled on amlodipine 10 mg benazepril 40 mg , furosemide and carvedilol. I am recommending that he undergo a follow-up echo Doppler study to reassess his bioprosthetic aortic valve.  He will be seeing Dr. Dennard Schaumann who will be rechecking blood work.  I reviewed his most recent lab work.  His echo Doppler study remains stable, I will  see him in one year for reevaluation, but will see him sooner if problems arise.   Time spent: 25 minutes  Troy Sine, MD, [facility]  03/17/2015 8:00 PM

## 2015-03-21 ENCOUNTER — Ambulatory Visit: Payer: Medicare Other | Admitting: Family Medicine

## 2015-03-24 ENCOUNTER — Ambulatory Visit (INDEPENDENT_AMBULATORY_CARE_PROVIDER_SITE_OTHER): Payer: Medicare Other | Admitting: Family Medicine

## 2015-03-24 ENCOUNTER — Encounter: Payer: Self-pay | Admitting: Family Medicine

## 2015-03-24 VITALS — BP 120/80 | HR 68 | Temp 98.6°F | Resp 18 | Wt 136.5 lb

## 2015-03-24 DIAGNOSIS — I5021 Acute systolic (congestive) heart failure: Secondary | ICD-10-CM | POA: Diagnosis not present

## 2015-03-24 DIAGNOSIS — I10 Essential (primary) hypertension: Secondary | ICD-10-CM

## 2015-03-24 DIAGNOSIS — E118 Type 2 diabetes mellitus with unspecified complications: Secondary | ICD-10-CM | POA: Diagnosis not present

## 2015-03-24 NOTE — Progress Notes (Signed)
Subjective:    Patient ID: [name], male    DOB: 1921-12-18, 82 y.o.   MRN: [id number]  HPI 11/04/14 Patient is a very pleasant 82 year old white male who presents with dyspnea on exertion. It has been recently worsening over the last few days. Walking to the exam room on room air he is 81%. At rest on 2 L via nasal cannula, his oxygen saturation rises to 97%. Patient denies chest pain. He does have a history of coronary artery disease status post CABG. There is no edema. His weight is up 3 pounds since his last office visit but on physical exam today there is no evidence of fluid overload. He does have faint bibasilar crackles left greater than right. He denies any angina. He denies any orthopnea. He only reports dyspnea on exertion.  At that time, my plan was: Based on his physical exam, I am suspicious the patient has left lower lobe pneumonia. I will check a fingerstick hemoglobin to rule out severe anemia. I will check an EKG. I will also order a chest x-ray. If pneumonia is confirmed, we'll try outpatient treatment with levaquin and oxygen at home as clinically the patient appears well.    Hemoglobin is 11.7 and I do not expect this would explain his dyspnea on exertion. EKG shows Q waves in aVL which are old. It also shows Q waves in the precordial leads which are old. There is some mild ST segment depression and nonspecific changes in the inferior leads which are old compared to an EKG obtained in August 2016. Therefore there is no change in his EKG. I will send the patient for a chest x-ray.  I will arrange for the patient's oxygen at home  2 L via nasal cannula. I will await the results of the chest x-ray.  Patient's sats dropped to 81% with exertion on room air. On 2 L via nasal cannula at rest, the patient's saturations were 93%. With exertion on 2 L via nasal cannula, oxygen saturations were 97%.  11/08/14 CXR revealed: Probable interstitial pulmonary edema with small bilateral  pleural effusions. My plan was: X-ray shows pulmonary edema and small pleural effusions. This may explain the abnormalities heard on his pulm exam. Increase Lasix to 40 mg twice a day and potassium to 20 mEq twice a day. Wear oxygen at home. Recheck on Monday. Patient will likely need an echocardiogram. Go to the hospital if worsening.  Patient states that his breathing is much better since increasing his fluid pills. He has lost 5 pounds since I increased his fluid pills. His dyspnea on exertion has improved dramatically. He is compliant with wearing his oxygen. He denies any chest pain.  At that time, my plan was: Discontinue amlodipine. Discontinue potassium. Decrease fluid pill back to 20 mg by mouth daily. Continue with oxygen. Recheck in one week  11/15/14 Patient is here for follow up.  He has gained 2 lbs since his OV 1 week ago.  O2 sats are 97% on 2 L, 83% on RA at rest.  He feels great on oxygen.  BP has risen substantially off amlodipine.  At that time, my plan was: Increase lasix to 40 mg poqday.  Stay off potassium and recheck BMP in 1 month.  Resume amlodipine 5 mg poqday.  Continue O2 2 L via [place].  Recheck in 1 month. Wt Readings from Last 3 Encounters:  03/24/15 136 lb 8 oz (61.916 kg)  03/17/15 135 lb (61.236 kg)  12/16/14 130  lb (58.968 kg)   12/16/14 Patient is here today for follow-up. His weight is stable since last office visit at 130 pounds. He has no pitting edema in his legs. There are no rails that I can appreciate in his lungs. He states that his shortness of breath is much improved. He denies any dyspnea at rest. He denies any dyspnea on exertion. Overall he is doing well and his blood pressure is much better today at 122/58. Clinically he seems to be doing very well.  At that time, my plan was: Clinically, the patient's medication regimen seems to be perfect. I will check his BMP to monitor his renal function and potassium. If stable I will continue Lasix 40 mg every day  and recheck the patient back in 3 months  03/24/15 He continues to thrive at home.  His weight is up 6 pounds but he is not retaining any fluid in his legs. Furthermore his lungs are clear. His oxygen saturations are 99% on oxygen.  He denies any shortness of breath or chest pain or palpitations. His daughter is cooking for him now on a daily basis and that is why he has gained weight because he is eating better. His strength is improved. He denies any pain. He denies any stomach problems. He denies any constipation or diarrhea. He is overdue to check fasting lab work. He is eating today and therefore I hate to make him come back. But I would like to recheck his hemoglobin A1c to monitor his diabetes and his blood pressure today is well controlled at 120/80. He denies any symptoms of hypoglycemia. At present his diabetes is controlled through diet.  Past Medical History  Diagnosis Date  . Diabetes mellitus   . Stroke (HCC)   . Cataract   . CAD (coronary artery disease)   . Carotid stenosis 09/24/11    right bulb/proximal ICA demonstrates a mild amont of fibrous plaque elevating velocitieswithin the proximal and mid segments of the internal carotid artery. Left CEA demonstrats a trace amount of fibrous plaque with  no evidence of a significant diameter reduction, tortuosity or any other vascular abnormality.  . Hypertension 08/28/09    ECHO- EF 50-55%  . Hyperlipidemia 06/02/08    Nuclear stress test- high risk scan EF 29% Post stress LV cavity enlargement suggests multi vessel disease.   Past Surgical History  Procedure Laterality Date  . Appendectomy    . Coronary artery bypass graft      + AV replacement  . Carpal tunnel release      Left  . Carotid endarterectomy      with darcon patch angioplasty 03/17/2001  . Cardiac catheterization  06/10/2008  . Tee without cardioversion N/A 08/15/2012    Procedure: TRANSESOPHAGEAL ECHOCARDIOGRAM (TEE);  Surgeon: Pricilla Riffle, MD;  Location: [facility]  ENDOSCOPY;  Service: Cardiovascular;  Laterality: N/A;   Current Outpatient Prescriptions on File Prior to Visit  Medication Sig Dispense Refill  . amLODipine (NORVASC) 10 MG tablet Take 1 tablet (10 mg total) by mouth daily. 30 tablet 3  . aspirin EC 81 MG tablet Take 81 mg by mouth daily.    Marland Kitchen atorvastatin (LIPITOR) 80 MG tablet Take 80 mg by mouth daily. Take a half tablet by mouth daily    . benazepril (LOTENSIN) 40 MG tablet TAKE 1 TABLET(40 MG) BY MOUTH DAILY 30 tablet 11  . carvedilol (COREG) 12.5 MG tablet TAKE 1 TABLET BY MOUTH TWICE DAILY WITH A MEAL 180 tablet 2  .  clopidogrel (PLAVIX) 75 MG tablet TAKE 1 TABLET BY MOUTH EVERY DAY 30 tablet 11  . furosemide (LASIX) 20 MG tablet Take 2 tablets (40 mg total) by mouth daily. 60 tablet 11  . pyridOXINE (VITAMIN B-6) 100 MG tablet Take 100 mg by mouth daily.    Marland Kitchen VITAMIN D, CHOLECALCIFEROL, PO Take 1,000 Units by mouth daily.       No current facility-administered medications on file prior to visit.   No Known Allergies Social History   Social History  . Marital Status: Widowed    Spouse Name: N/A  . Number of Children: 2  . Years of Education: 10   Occupational History  . retired    Social History Main Topics  . Smoking status: Former Smoker    Quit date: 02/28/1978  . Smokeless tobacco: Never Used  . Alcohol Use: No  . Drug Use: No  . Sexual Activity: Not on file   Other Topics Concern  . Not on file   Social History Narrative      Review of Systems  All other systems reviewed and are negative.      Objective:   Physical Exam  Constitutional: He appears well-developed and well-nourished.  Neck: No JVD present.  Cardiovascular: Normal rate, regular rhythm and normal heart sounds.   Pulmonary/Chest: Effort normal.  Abdominal: Soft. Bowel sounds are normal. He exhibits no distension. There is no tenderness. There is no rebound.  Musculoskeletal: He exhibits no edema.  Vitals reviewed.           Assessment & Plan:  Controlled type 2 diabetes mellitus with complication, without long-term current use of insulin (HCC) - Plan: COMPLETE METABOLIC PANEL WITH GFR, CBC with Differential/Platelet, Hemoglobin A1c  Benign essential HTN  CHF (congestive heart failure), NYHA class III, acute, systolic (HCC)   I am pleasantly surprised by how well the patient is doing. I believe the 40 mg a day of Lasix is the appropriate dose of diuretic that is maintaining his fluid status. There is no evidence of fluid overload on exam or pulmonary edema. His oxygen is stable. His blood pressure is excellent. Therefore I will make no changes in his medication at this time.  I will check a hemoglobin A1c to ensure that his diabetes is adequately controlled. Given his advanced age, I would not start medication for diabetes mellitus his hemoglobin A1c was well above 8. I will also check a CBC to monitor for anemia and check a CMP to monitor his kidney and liver function along with his potassium. I would like to get fasting lipid panel at his convenience. His immunizations are up-to-date.  When he does come by for fasting lab work, his goal LDL cholesterol be less than 70 given his history of coronary artery disease.

## 2015-03-25 LAB — CBC WITH DIFFERENTIAL/PLATELET
BASOS PCT: 0 % (ref 0–1)
Basophils Absolute: 0 10*3/uL (ref 0.0–0.1)
EOS ABS: 0.1 10*3/uL (ref 0.0–0.7)
EOS PCT: 2 % (ref 0–5)
HCT: 33.3 % — ABNORMAL LOW (ref 39.0–52.0)
Hemoglobin: 11 g/dL — ABNORMAL LOW (ref 13.0–17.0)
Lymphocytes Relative: 16 % (ref 12–46)
Lymphs Abs: 1 10*3/uL (ref 0.7–4.0)
MCH: 30.7 pg (ref 26.0–34.0)
MCHC: 33 g/dL (ref 30.0–36.0)
MCV: 93 fL (ref 78.0–100.0)
MPV: 9.1 fL (ref 8.6–12.4)
Monocytes Absolute: 0.4 10*3/uL (ref 0.1–1.0)
Monocytes Relative: 7 % (ref 3–12)
Neutro Abs: 4.6 10*3/uL (ref 1.7–7.7)
Neutrophils Relative %: 75 % (ref 43–77)
PLATELETS: 167 10*3/uL (ref 150–400)
RBC: 3.58 MIL/uL — AB (ref 4.22–5.81)
RDW: 12.5 % (ref 11.5–15.5)
WBC: 6.1 10*3/uL (ref 4.0–10.5)

## 2015-03-25 LAB — COMPLETE METABOLIC PANEL WITH GFR
ALK PHOS: 54 U/L (ref 40–115)
ALT: 15 U/L (ref 9–46)
AST: 24 U/L (ref 10–35)
Albumin: 3.3 g/dL — ABNORMAL LOW (ref 3.6–5.1)
BILIRUBIN TOTAL: 0.4 mg/dL (ref 0.2–1.2)
BUN: 29 mg/dL — ABNORMAL HIGH (ref 7–25)
CALCIUM: 8.4 mg/dL — AB (ref 8.6–10.3)
CO2: 35 mmol/L — ABNORMAL HIGH (ref 20–31)
CREATININE: 1.25 mg/dL — AB (ref 0.70–1.11)
Chloride: 100 mmol/L (ref 98–110)
GFR, EST AFRICAN AMERICAN: 58 mL/min — AB (ref >=60)
GFR, Est Non African American: 50 mL/min — ABNORMAL LOW (ref >=60)
Glucose, Bld: 161 mg/dL — ABNORMAL HIGH (ref 70–99)
Potassium: 3.8 mmol/L (ref 3.5–5.3)
Sodium: 142 mmol/L (ref 135–146)
TOTAL PROTEIN: 6.4 g/dL (ref 6.1–8.1)

## 2015-03-25 LAB — HEMOGLOBIN A1C
HEMOGLOBIN A1C: 6 % — AB (ref <5.7)
MEAN PLASMA GLUCOSE: 126 mg/dL — AB (ref <117)

## 2015-03-30 ENCOUNTER — Ambulatory Visit (HOSPITAL_COMMUNITY): Payer: Medicare Other | Attending: Cardiovascular Disease

## 2015-03-30 ENCOUNTER — Other Ambulatory Visit: Payer: Self-pay

## 2015-03-30 DIAGNOSIS — I350 Nonrheumatic aortic (valve) stenosis: Secondary | ICD-10-CM | POA: Diagnosis not present

## 2015-03-30 DIAGNOSIS — Z951 Presence of aortocoronary bypass graft: Secondary | ICD-10-CM | POA: Diagnosis not present

## 2015-03-30 DIAGNOSIS — E119 Type 2 diabetes mellitus without complications: Secondary | ICD-10-CM | POA: Diagnosis not present

## 2015-03-30 DIAGNOSIS — I10 Essential (primary) hypertension: Secondary | ICD-10-CM | POA: Insufficient documentation

## 2015-03-30 DIAGNOSIS — I340 Nonrheumatic mitral (valve) insufficiency: Secondary | ICD-10-CM | POA: Diagnosis not present

## 2015-03-30 DIAGNOSIS — Z954 Presence of other heart-valve replacement: Secondary | ICD-10-CM | POA: Diagnosis not present

## 2015-03-30 DIAGNOSIS — E785 Hyperlipidemia, unspecified: Secondary | ICD-10-CM | POA: Diagnosis not present

## 2015-03-30 DIAGNOSIS — I517 Cardiomegaly: Secondary | ICD-10-CM | POA: Diagnosis not present

## 2015-03-30 DIAGNOSIS — Z952 Presence of prosthetic heart valve: Secondary | ICD-10-CM

## 2015-04-07 DIAGNOSIS — R0600 Dyspnea, unspecified: Secondary | ICD-10-CM | POA: Diagnosis not present

## 2015-04-07 DIAGNOSIS — J9601 Acute respiratory failure with hypoxia: Secondary | ICD-10-CM | POA: Diagnosis not present

## 2015-04-12 ENCOUNTER — Encounter: Payer: Self-pay | Admitting: Podiatry

## 2015-04-12 ENCOUNTER — Ambulatory Visit (INDEPENDENT_AMBULATORY_CARE_PROVIDER_SITE_OTHER): Payer: Medicare Other | Admitting: Podiatry

## 2015-04-12 DIAGNOSIS — B351 Tinea unguium: Secondary | ICD-10-CM | POA: Diagnosis not present

## 2015-04-12 DIAGNOSIS — M79676 Pain in unspecified toe(s): Secondary | ICD-10-CM | POA: Diagnosis not present

## 2015-04-12 NOTE — Progress Notes (Signed)
Patient ID: [name], male   DOB: [birth year], 82 y.o.   MRN: [id number] Complaint:  Visit Type: Patient returns to my office for continued preventative foot care services. Complaint: Patient states" my nails have grown long and thick and become painful to walk and wear shoes" .This patient has diabetes with no foot complication. The patient presents for preventative foot care services. No changes to ROS  Podiatric Exam: Vascular: dorsalis pedis and posterior tibial pulses are not  palpable bilateral. Capillary return is immediate. Temperature gradient is WNL. Skin turgor WNL  Sensorium: Normal Semmes Weinstein monofilament test. Normal tactile sensation bilaterally. Nail Exam: Pt has thick disfigured discolored nails with subungual debris noted bilateral entire nail hallux through fifth toenails Ulcer Exam: There is no evidence of ulcer or pre-ulcerative changes or infection. Orthopedic Exam: Muscle tone and strength are WNL. No limitations in general ROM. No crepitus or effusions noted. Foot type and digits show no abnormalities. Bony prominences are unremarkable. HAV 1st MPJ right foot. Skin: No Porokeratosis. No infection or ulcers  Diagnosis:  Onychomycosis, , Pain in right toe, pain in left toes  Treatment & Plan Procedures and Treatment: Consent by patient was obtained for treatment procedures. The patient understood the discussion of treatment and procedures well. All questions were answered thoroughly reviewed. Debridement of mycotic and hypertrophic toenails, 1 through 5 bilateral and clearing of subungual debris. No ulceration, no infection noted.  Return Visit-Office Procedure: Patient instructed to return to the office for a follow up visit 3 months for continued evaluation and treatment.   Helane Gunther DPM

## 2015-05-05 DIAGNOSIS — J9601 Acute respiratory failure with hypoxia: Secondary | ICD-10-CM | POA: Diagnosis not present

## 2015-05-05 DIAGNOSIS — R0600 Dyspnea, unspecified: Secondary | ICD-10-CM | POA: Diagnosis not present

## 2015-05-10 ENCOUNTER — Other Ambulatory Visit: Payer: Self-pay | Admitting: Family Medicine

## 2015-05-11 ENCOUNTER — Telehealth: Payer: Self-pay | Admitting: *Deleted

## 2015-05-11 NOTE — Telephone Encounter (Signed)
Patient's daughter, Larene Beach informed of results.

## 2015-05-11 NOTE — Telephone Encounter (Signed)
Refill appropriate and filled per protocol. 

## 2015-05-11 NOTE — Telephone Encounter (Signed)
-----   Message from Lennette Bihari, MD sent at 04/08/2015  3:18 PM EST ----- Left ventricle: MId and basal inferior wall and septal hypokinesis. The cavity size was mildly dilated. Wall thickness was normal. Systolic function was normal. The estimated ejection fraction was in the range of 50% to 55%. Aortic valve: Tissue AVR normal gradients no perivalvular regurgitation Leaflets not well seen. There was mild stenosis.

## 2015-06-05 DIAGNOSIS — R0600 Dyspnea, unspecified: Secondary | ICD-10-CM | POA: Diagnosis not present

## 2015-06-05 DIAGNOSIS — J9601 Acute respiratory failure with hypoxia: Secondary | ICD-10-CM | POA: Diagnosis not present

## 2015-07-05 DIAGNOSIS — J9601 Acute respiratory failure with hypoxia: Secondary | ICD-10-CM | POA: Diagnosis not present

## 2015-07-05 DIAGNOSIS — R0600 Dyspnea, unspecified: Secondary | ICD-10-CM | POA: Diagnosis not present

## 2015-07-17 ENCOUNTER — Other Ambulatory Visit: Payer: Self-pay | Admitting: Family Medicine

## 2015-07-26 ENCOUNTER — Ambulatory Visit (INDEPENDENT_AMBULATORY_CARE_PROVIDER_SITE_OTHER): Payer: Medicare Other | Admitting: Podiatry

## 2015-07-26 DIAGNOSIS — B351 Tinea unguium: Secondary | ICD-10-CM | POA: Diagnosis not present

## 2015-07-26 DIAGNOSIS — M79676 Pain in unspecified toe(s): Secondary | ICD-10-CM

## 2015-07-26 NOTE — Progress Notes (Signed)
Patient ID: Darik J Frix, male   DOB: 03/18/1921, 82 y.o.   MRN: [id number] Complaint:  Visit Type: Patient returns to my office for continued preventative foot care services. Complaint: Patient states" my nails have grown long and thick and become painful to walk and wear shoes" .This patient has diabetes with no foot complication. The patient presents for preventative foot care services. No changes to ROS  Podiatric Exam: Vascular: dorsalis pedis and posterior tibial pulses are not  palpable bilateral. Capillary return is immediate. Temperature gradient is WNL. Skin turgor WNL  Sensorium: Normal Semmes Weinstein monofilament test. Normal tactile sensation bilaterally. Nail Exam: Pt has thick disfigured discolored nails with subungual debris noted bilateral entire nail hallux through fifth toenails Ulcer Exam: There is no evidence of ulcer or pre-ulcerative changes or infection. Orthopedic Exam: Muscle tone and strength are WNL. No limitations in general ROM. No crepitus or effusions noted. Foot type and digits show no abnormalities. Bony prominences are unremarkable. HAV 1st MPJ right foot. Skin: No Porokeratosis. No infection or ulcers  Diagnosis:  Onychomycosis, , Pain in right toe, pain in left toes  Treatment & Plan Procedures and Treatment: Consent by patient was obtained for treatment procedures. The patient understood the discussion of treatment and procedures well. All questions were answered thoroughly reviewed. Debridement of mycotic and hypertrophic toenails, 1 through 5 bilateral and clearing of subungual debris. No ulceration, no infection noted.  Return Visit-Office Procedure: Patient instructed to return to the office for a follow up visit 3 months for continued evaluation and treatment.   Mishawn Hemann DPM 

## 2015-08-05 DIAGNOSIS — J9601 Acute respiratory failure with hypoxia: Secondary | ICD-10-CM | POA: Diagnosis not present

## 2015-08-05 DIAGNOSIS — R0600 Dyspnea, unspecified: Secondary | ICD-10-CM | POA: Diagnosis not present

## 2015-09-04 DIAGNOSIS — R0600 Dyspnea, unspecified: Secondary | ICD-10-CM | POA: Diagnosis not present

## 2015-09-04 DIAGNOSIS — J9601 Acute respiratory failure with hypoxia: Secondary | ICD-10-CM | POA: Diagnosis not present

## 2015-09-09 ENCOUNTER — Other Ambulatory Visit: Payer: Self-pay | Admitting: Family Medicine

## 2015-09-16 ENCOUNTER — Ambulatory Visit (INDEPENDENT_AMBULATORY_CARE_PROVIDER_SITE_OTHER): Payer: Medicare Other | Admitting: Family Medicine

## 2015-09-16 ENCOUNTER — Encounter: Payer: Self-pay | Admitting: Family Medicine

## 2015-09-16 ENCOUNTER — Telehealth: Payer: Self-pay | Admitting: Family Medicine

## 2015-09-16 ENCOUNTER — Ambulatory Visit
Admission: RE | Admit: 2015-09-16 | Discharge: 2015-09-16 | Disposition: A | Discharge status: Home / Self Care | Payer: Medicare Other | Source: Ambulatory Visit | Attending: Family Medicine | Admitting: Family Medicine

## 2015-09-16 VITALS — BP 120/50 | HR 80 | Temp 98.4°F | Resp 14 | Ht 65.0 in | Wt 134.0 lb

## 2015-09-16 DIAGNOSIS — M25511 Pain in right shoulder: Secondary | ICD-10-CM | POA: Diagnosis not present

## 2015-09-16 DIAGNOSIS — W1809XA Striking against other object with subsequent fall, initial encounter: Secondary | ICD-10-CM | POA: Diagnosis not present

## 2015-09-16 NOTE — Progress Notes (Signed)
Subjective:    Patient ID: [name], male    DOB: [birth year], 82 y.o.   MRN: [id number]  HPI  Patient yesterday suffering a skin tear to the left knee above the anterior tibial tubercle area this is approximately 3 cm in diameter and very superficial. He also 2 lesion over the head of the radius and his right lateral epicondyles. He has full range of motion in his elbow without pain. There is a bruise in that area but there is no bony tenderness. However he has significant pain in his right shoulder. Abduction is limited to 75 without severe pain. He is also tender to palpation over the distal end of the clavicle. He also suffered a contusion to his left forehead which is very small and appears minor Past Medical History:  Diagnosis Date  . CAD (coronary artery disease)   . Carotid stenosis 09/24/11   right bulb/proximal ICA demonstrates a mild amont of fibrous plaque elevating velocitieswithin the proximal and mid segments of the internal carotid artery. Left CEA demonstrats a trace amount of fibrous plaque with  no evidence of a significant diameter reduction, tortuosity or any other vascular abnormality.  . Cataract   . Diabetes mellitus   . Hyperlipidemia 06/02/08   Nuclear stress test- high risk scan EF 29% Post stress LV cavity enlargement suggests multi vessel disease.  Marland Kitchen Hypertension 08/28/09   ECHO- EF 50-55%  . Stroke [facility])    Past Surgical History:  Procedure Laterality Date  . APPENDECTOMY    . CARDIAC CATHETERIZATION  06/10/2008  . CAROTID ENDARTERECTOMY     with darcon patch angioplasty 03/17/2001  . CARPAL TUNNEL RELEASE     Left  . CORONARY ARTERY BYPASS GRAFT     + AV replacement  . TEE WITHOUT CARDIOVERSION N/A 08/15/2012   Procedure: TRANSESOPHAGEAL ECHOCARDIOGRAM (TEE);  Surgeon: Pricilla Riffle, MD;  Location: [facility] ENDOSCOPY;  Service: Cardiovascular;  Laterality: N/A;   Current Outpatient Prescriptions on File Prior to Visit  Medication Sig Dispense Refill  .  amLODipine (NORVASC) 10 MG tablet TAKE 1 TABLET(10 MG) BY MOUTH DAILY 90 tablet 3  . aspirin EC 81 MG tablet Take 81 mg by mouth daily.    Marland Kitchen atorvastatin (LIPITOR) 80 MG tablet TAKE 1 TABLET BY MOUTH EVERY DAY 30 tablet 11  . benazepril (LOTENSIN) 40 MG tablet TAKE 1 TABLET(40 MG) BY MOUTH DAILY 30 tablet 11  . carvedilol (COREG) 12.5 MG tablet TAKE 1 TABLET BY MOUTH TWICE DAILY WITH A MEAL 180 tablet 2  . clopidogrel (PLAVIX) 75 MG tablet TAKE 1 TABLET BY MOUTH EVERY DAY 30 tablet 11  . furosemide (LASIX) 20 MG tablet Take 2 tablets (40 mg total) by mouth daily. 60 tablet 11  . pyridOXINE (VITAMIN B-6) 100 MG tablet Take 100 mg by mouth daily.    Marland Kitchen VITAMIN D, CHOLECALCIFEROL, PO Take 1,000 Units by mouth daily.       No current facility-administered medications on file prior to visit.    No Known Allergies Social History   Social History  . Marital status: Widowed    Spouse name: N/A  . Number of children: 2  . Years of education: 10   Occupational History  . retired    Social History Main Topics  . Smoking status: Former Smoker    Quit date: 02/28/1978  . Smokeless tobacco: Never Used  . Alcohol use No  . Drug use: No  . Sexual activity: Not on file  Other Topics Concern  . Not on file   Social History Narrative  . No narrative on file     Review of Systems  All other systems reviewed and are negative.      Objective:   Physical Exam  Constitutional: He is oriented to person, place, and time. He appears well-developed and well-nourished.  Cardiovascular: Normal rate, regular rhythm and normal heart sounds.   Pulmonary/Chest: Effort normal and breath sounds normal.  Musculoskeletal:       Right shoulder: He exhibits decreased range of motion, tenderness, bony tenderness, pain and decreased strength.       Right elbow: He exhibits swelling. He exhibits normal range of motion, no effusion, no deformity and no laceration. No tenderness found. No radial head, no  medial epicondyle, no lateral epicondyle and no olecranon process tenderness noted.  Neurological: He is alert and oriented to person, place, and time. No cranial nerve deficit.  Vitals reviewed.         Assessment & Plan:  Right shoulder pain - Plan: DG Shoulder Right  Fall against object, initial encounter  Skin tear to the left knee will heal fine. It was covered with a Tegaderm. Contusion to the right elbow appears minor as well as a contusion to his left forehead. However I'm very concerned about his right shoulder pain. I will obtain an x-ray to rule out a fracture to the distal clavicle. At the very least I believe he probably to his rotator cuff. Given his age and focus on pain control. Obtain x-ray to rule out fracture

## 2015-09-16 NOTE — Telephone Encounter (Signed)
Pt's daughter aware of shoulder xray and he has an appt already for next week so if no better by then we can do injection at that time.

## 2015-09-22 ENCOUNTER — Ambulatory Visit (INDEPENDENT_AMBULATORY_CARE_PROVIDER_SITE_OTHER): Payer: Medicare Other | Admitting: Family Medicine

## 2015-09-22 ENCOUNTER — Encounter: Payer: Self-pay | Admitting: Family Medicine

## 2015-09-22 ENCOUNTER — Other Ambulatory Visit: Payer: Self-pay | Admitting: Cardiovascular Disease

## 2015-09-22 VITALS — BP 110/50 | HR 76 | Temp 98.1°F | Resp 14 | Ht 65.0 in | Wt 135.0 lb

## 2015-09-22 DIAGNOSIS — E119 Type 2 diabetes mellitus without complications: Secondary | ICD-10-CM

## 2015-09-22 NOTE — Progress Notes (Signed)
Subjective:    Patient ID: [name], male    DOB: 09-13-1921, 82 y.o.   MRN: [id number]  HPI9/15/16 Patient is a very pleasant 82 year old white male who presents with dyspnea on exertion. It has been recently worsening over the last few days. Walking to the exam room on room air he is 81%. At rest on 2 L via nasal cannula, his oxygen saturation rises to 97%. Patient denies chest pain. He does have a history of coronary artery disease status post CABG. There is no edema. His weight is up 3 pounds since his last office visit but on physical exam today there is no evidence of fluid overload. He does have faint bibasilar crackles left greater than right. He denies any angina. He denies any orthopnea. He only reports dyspnea on exertion.  At that time, my plan was: Based on his physical exam, I am suspicious the patient has left lower lobe pneumonia. I will check a fingerstick hemoglobin to rule out severe anemia. I will check an EKG. I will also order a chest x-ray. If pneumonia is confirmed, we'll try outpatient treatment with levaquin and oxygen at home as clinically the patient appears well.    Hemoglobin is 11.7 and I do not expect this would explain his dyspnea on exertion. EKG shows Q waves in aVL which are old. It also shows Q waves in the precordial leads which are old. There is some mild ST segment depression and nonspecific changes in the inferior leads which are old compared to an EKG obtained in August 2016. Therefore there is no change in his EKG. I will send the patient for a chest x-ray.  I will arrange for the patient's oxygen at home  2 L via nasal cannula. I will await the results of the chest x-ray.  Patient's sats dropped to 81% with exertion on room air. On 2 L via nasal cannula at rest, the patient's saturations were 93%. With exertion on 2 L via nasal cannula, oxygen saturations were 97%.  11/08/14 CXR revealed: Probable interstitial pulmonary edema with small bilateral pleural  effusions. My plan was: X-ray shows pulmonary edema and small pleural effusions. This may explain the abnormalities heard on his pulm exam. Increase Lasix to 40 mg twice a day and potassium to 20 mEq twice a day. Wear oxygen at home. Recheck on Monday. Patient will likely need an echocardiogram. Go to the hospital if worsening.  Patient states that his breathing is much better since increasing his fluid pills. He has lost 5 pounds since I increased his fluid pills. His dyspnea on exertion has improved dramatically. He is compliant with wearing his oxygen. He denies any chest pain.  At that time, my plan was: Discontinue amlodipine. Discontinue potassium. Decrease fluid pill back to 20 mg by mouth daily. Continue with oxygen. Recheck in one week  11/15/14 Patient is here for follow up.  He has gained 2 lbs since his OV 1 week ago.  O2 sats are 97% on 2 L, 83% on RA at rest.  He feels great on oxygen.  BP has risen substantially off amlodipine.  At that time, my plan was: Increase lasix to 40 mg poqday.  Stay off potassium and recheck BMP in 1 month.  Resume amlodipine 5 mg poqday.  Continue O2 2 L via [place].  Recheck in 1 month. Wt Readings from Last 3 Encounters:  09/22/15 135 lb (61.2 kg)  09/16/15 134 lb (60.8 kg)  03/24/15 136 lb 8 oz (  61.9 kg)   12/16/14 Patient is here today for follow-up. His weight is stable since last office visit at 130 pounds. He has no pitting edema in his legs. There are no rails that I can appreciate in his lungs. He states that his shortness of breath is much improved. He denies any dyspnea at rest. He denies any dyspnea on exertion. Overall he is doing well and his blood pressure is much better today at 122/58. Clinically he seems to be doing very well.  At that time, my plan was: Clinically, the patient's medication regimen seems to be perfect. I will check his BMP to monitor his renal function and potassium. If stable I will continue Lasix 40 mg every day and recheck  the patient back in 3 months  03/24/15 He continues to thrive at home.  His weight is up 6 pounds but he is not retaining any fluid in his legs. Furthermore his lungs are clear. His oxygen saturations are 99% on oxygen.  He denies any shortness of breath or chest pain or palpitations. His daughter is cooking for him now on a daily basis and that is why he has gained weight because he is eating better. His strength is improved. He denies any pain. He denies any stomach problems. He denies any constipation or diarrhea. He is overdue to check fasting lab work. He is eating today and therefore I hate to make him come back. But I would like to recheck his hemoglobin A1c to monitor his diabetes and his blood pressure today is well controlled at 120/80. He denies any symptoms of hypoglycemia. At present his diabetes is controlled through diet. At that time, my plan was: I am pleasantly surprised by how well the patient is doing. I believe the 40 mg a day of Lasix is the appropriate dose of diuretic that is maintaining his fluid status. There is no evidence of fluid overload on exam or pulmonary edema. His oxygen is stable. His blood pressure is excellent. Therefore I will make no changes in his medication at this time.  I will check a hemoglobin A1c to ensure that his diabetes is adequately controlled. Given his advanced age, I would not start medication for diabetes mellitus his hemoglobin A1c was well above 8. I will also check a CBC to monitor for anemia and check a CMP to monitor his kidney and liver function along with his potassium. I would like to get fasting lipid panel at his convenience. His immunizations are up-to-date.  When he does come by for fasting lab work, his goal LDL cholesterol be less than 70 given his history of coronary artery disease.  09/22/15 Patient is here to a for recheck. Pulse oximetry on room air was 85%. On 2 L via nasal cannula is up to 95%. His lungs are clear to auscultation  bilaterally. There are no wheezes crackles [place]. Please see my last office visit. The skin tear on his left knee is healing nicely. It now just needs a simple Band-Aid. He continues to have decreased range of motion in his right shoulder. Abduction is limited to 90. However he denies any pain. He is due for lab work  Past Medical History:  Diagnosis Date  . CAD (coronary artery disease)   . Carotid stenosis 09/24/11   right bulb/proximal ICA demonstrates a mild amont of fibrous plaque elevating velocitieswithin the proximal and mid segments of the internal carotid artery. Left CEA demonstrats a trace amount of fibrous plaque with  no evidence  of a significant diameter reduction, tortuosity or any other vascular abnormality.  . Cataract   . Diabetes mellitus   . Hyperlipidemia 06/02/08   Nuclear stress test- high risk scan EF 29% Post stress LV cavity enlargement suggests multi vessel disease.  Marland Kitchen Hypertension 08/28/09   ECHO- EF 50-55%  . Stroke [facility])    Past Surgical History:  Procedure Laterality Date  . APPENDECTOMY    . CARDIAC CATHETERIZATION  06/10/2008  . CAROTID ENDARTERECTOMY     with darcon patch angioplasty 03/17/2001  . CARPAL TUNNEL RELEASE     Left  . CORONARY ARTERY BYPASS GRAFT     + AV replacement  . TEE WITHOUT CARDIOVERSION N/A 08/15/2012   Procedure: TRANSESOPHAGEAL ECHOCARDIOGRAM (TEE);  Surgeon: Pricilla Riffle, MD;  Location: [facility] ENDOSCOPY;  Service: Cardiovascular;  Laterality: N/A;   Current Outpatient Prescriptions on File Prior to Visit  Medication Sig Dispense Refill  . amLODipine (NORVASC) 10 MG tablet TAKE 1 TABLET(10 MG) BY MOUTH DAILY 90 tablet 3  . aspirin EC 81 MG tablet Take 81 mg by mouth daily.    Marland Kitchen atorvastatin (LIPITOR) 80 MG tablet TAKE 1 TABLET BY MOUTH EVERY DAY 30 tablet 11  . benazepril (LOTENSIN) 40 MG tablet TAKE 1 TABLET(40 MG) BY MOUTH DAILY 30 tablet 11  . carvedilol (COREG) 12.5 MG tablet TAKE 1 TABLET BY MOUTH TWICE DAILY WITH A MEAL 180  tablet 2  . clopidogrel (PLAVIX) 75 MG tablet TAKE 1 TABLET BY MOUTH EVERY DAY 30 tablet 11  . furosemide (LASIX) 20 MG tablet Take 2 tablets (40 mg total) by mouth daily. 60 tablet 11  . pyridOXINE (VITAMIN B-6) 100 MG tablet Take 100 mg by mouth daily.    Marland Kitchen VITAMIN D, CHOLECALCIFEROL, PO Take 1,000 Units by mouth daily.       No current facility-administered medications on file prior to visit.    No Known Allergies Social History   Social History  . Marital status: Widowed    Spouse name: N/A  . Number of children: 2  . Years of education: 10   Occupational History  . retired    Social History Main Topics  . Smoking status: Former Smoker    Quit date: 02/28/1978  . Smokeless tobacco: Never Used  . Alcohol use No  . Drug use: No  . Sexual activity: Not on file   Other Topics Concern  . Not on file   Social History Narrative  . No narrative on file      Review of Systems  All other systems reviewed and are negative.      Objective:   Physical Exam  Constitutional: He appears well-developed and well-nourished.  Neck: No JVD present.  Cardiovascular: Normal rate, regular rhythm and normal heart sounds.   Pulmonary/Chest: Effort normal.  Abdominal: Soft. Bowel sounds are normal. He exhibits no distension. There is no tenderness. There is no rebound.  Musculoskeletal: He exhibits no edema.  Vitals reviewed.         Assessment & Plan:  Controlled type 2 diabetes mellitus without complication, without long-term current use of insulin (HCC) - Plan: CBC with Differential/Platelet, COMPLETE METABOLIC PANEL WITH GFR, Hemoglobin A1c  Patient appears euvolemic today. There is no pitting edema. His lungs are clear. I will make no changes in his dose of medication. His blood pressures well controlled. He does require 2 L of oxygen via nasal cannula due to chronic hypoxemia. I will check a hemoglobin A1c along with a CBC  and a CMP. If lab work is excellent, I would just  recheck the patient in 6 months or as needed

## 2015-09-23 LAB — CBC WITH DIFFERENTIAL/PLATELET
BASOS ABS: 0 {cells}/uL (ref 0–200)
Basophils Relative: 0 %
EOS PCT: 2 %
Eosinophils Absolute: 118 cells/uL (ref 15–500)
HCT: 35 % — ABNORMAL LOW (ref 38.5–50.0)
Hemoglobin: 11.2 g/dL — ABNORMAL LOW (ref 13.0–17.0)
Lymphocytes Relative: 14 %
Lymphs Abs: 826 cells/uL — ABNORMAL LOW (ref 850–3900)
MCH: 30.4 pg (ref 27.0–33.0)
MCHC: 32 g/dL (ref 32.0–36.0)
MCV: 95.1 fL (ref 80.0–100.0)
MONOS PCT: 9 %
MPV: 9 fL (ref 7.5–12.5)
Monocytes Absolute: 531 cells/uL (ref 200–950)
NEUTROS PCT: 75 %
Neutro Abs: 4425 cells/uL (ref 1500–7800)
PLATELETS: 170 10*3/uL (ref 140–400)
RBC: 3.68 MIL/uL — ABNORMAL LOW (ref 4.20–5.80)
RDW: 13.8 % (ref 11.0–15.0)
WBC: 5.9 10*3/uL (ref 3.8–10.8)

## 2015-09-23 LAB — COMPLETE METABOLIC PANEL WITH GFR
ALT: 26 U/L (ref 9–46)
AST: 30 U/L (ref 10–35)
Albumin: 3.4 g/dL — ABNORMAL LOW (ref 3.6–5.1)
Alkaline Phosphatase: 56 U/L (ref 40–115)
BUN: 30 mg/dL — ABNORMAL HIGH (ref 7–25)
CO2: 28 mmol/L (ref 20–31)
Calcium: 8.9 mg/dL (ref 8.6–10.3)
Chloride: 100 mmol/L (ref 98–110)
Creat: 1.23 mg/dL — ABNORMAL HIGH (ref 0.70–1.11)
GFR, EST NON AFRICAN AMERICAN: 51 mL/min — AB (ref >=60)
GFR, Est African American: 59 mL/min — ABNORMAL LOW (ref >=60)
Glucose, Bld: 149 mg/dL — ABNORMAL HIGH (ref 70–99)
POTASSIUM: 4.3 mmol/L (ref 3.5–5.3)
SODIUM: 144 mmol/L (ref 135–146)
Total Bilirubin: 0.4 mg/dL (ref 0.2–1.2)
Total Protein: 6.6 g/dL (ref 6.1–8.1)

## 2015-09-23 LAB — HEMOGLOBIN A1C
Hgb A1c MFr Bld: 6 % — ABNORMAL HIGH (ref <5.7)
Mean Plasma Glucose: 126 mg/dL

## 2015-10-05 DIAGNOSIS — R0600 Dyspnea, unspecified: Secondary | ICD-10-CM | POA: Diagnosis not present

## 2015-10-05 DIAGNOSIS — J9601 Acute respiratory failure with hypoxia: Secondary | ICD-10-CM | POA: Diagnosis not present

## 2015-10-28 ENCOUNTER — Ambulatory Visit (INDEPENDENT_AMBULATORY_CARE_PROVIDER_SITE_OTHER): Payer: Medicare Other

## 2015-10-28 DIAGNOSIS — Z23 Encounter for immunization: Secondary | ICD-10-CM | POA: Diagnosis not present

## 2015-11-01 ENCOUNTER — Ambulatory Visit: Payer: Medicare Other | Admitting: Podiatry

## 2015-11-04 ENCOUNTER — Ambulatory Visit: Payer: Medicare Other

## 2015-11-05 DIAGNOSIS — R0600 Dyspnea, unspecified: Secondary | ICD-10-CM | POA: Diagnosis not present

## 2015-11-05 DIAGNOSIS — J9601 Acute respiratory failure with hypoxia: Secondary | ICD-10-CM | POA: Diagnosis not present

## 2015-11-06 ENCOUNTER — Other Ambulatory Visit: Payer: Self-pay | Admitting: Family Medicine

## 2015-11-07 ENCOUNTER — Ambulatory Visit (INDEPENDENT_AMBULATORY_CARE_PROVIDER_SITE_OTHER): Payer: Medicare Other | Admitting: Podiatry

## 2015-11-07 DIAGNOSIS — M79676 Pain in unspecified toe(s): Secondary | ICD-10-CM | POA: Diagnosis not present

## 2015-11-07 DIAGNOSIS — B351 Tinea unguium: Secondary | ICD-10-CM

## 2015-11-07 DIAGNOSIS — M79673 Pain in unspecified foot: Secondary | ICD-10-CM

## 2015-11-07 NOTE — Progress Notes (Signed)
SUBJECTIVE Patient with a history of diabetes mellitus presents to office today complaining of elongated, thickened nails. Pain while ambulating in shoes. Patient is unable to trim their own nails.   No Known Allergies  OBJECTIVE General Patient is awake, alert, and oriented x 3 and in no acute distress. Derm Skin is dry and supple bilateral. Negative open lesions or macerations. Remaining integument unremarkable. Nails are tender, long, thickened and dystrophic with subungual debris, consistent with onychomycosis, 1-5 bilateral. No signs of infection noted. Vasc  DP and PT pedal pulses palpable bilaterally. Temperature gradient within normal limits.  Neuro Epicritic and protective threshold sensation diminished bilaterally.  Musculoskeletal Exam No symptomatic pedal deformities noted bilateral. Muscular strength within normal limits.  ASSESSMENT 1. Diabetes Mellitus w/ peripheral neuropathy 2. Onychomycosis of nail due to dermatophyte bilateral 3. Pain in foot bilateral  PLAN OF CARE Patient evaluated today. Instructed to maintain good pedal hygiene and foot care. Stressed importance of controlling blood sugar.  Mechanical debridement of nails 1-5 bilaterally performed using a nail nipper. Filed with dremel without incident.  All patient questions were answered. Return to clinic in 3 mos.    Kimbra Marcelino M Chrisanne Loose, DPM   

## 2015-11-07 NOTE — Patient Instructions (Signed)
Diabetes and Foot Care Diabetes may cause you to have problems because of poor blood supply (circulation) to your feet and legs. This may cause the skin on your feet to become thinner, break easier, and heal more slowly. Your skin may become dry, and the skin may peel and crack. You may also have nerve damage in your legs and feet causing decreased feeling in them. You may not notice minor injuries to your feet that could lead to infections or more serious problems. Taking care of your feet is one of the most important things you can do for yourself.  HOME CARE INSTRUCTIONS  Wear shoes at all times, even in the house. Do not go barefoot. Bare feet are easily injured.  Check your feet daily for blisters, cuts, and redness. If you cannot see the bottom of your feet, use a mirror or ask someone for help.  Wash your feet with warm water (do not use hot water) and mild soap. Then pat your feet and the areas between your toes until they are completely dry. Do not soak your feet as this can dry your skin.  Apply a moisturizing lotion or petroleum jelly (that does not contain alcohol and is unscented) to the skin on your feet and to dry, brittle toenails. Do not apply lotion between your toes.  Trim your toenails straight across. Do not dig under them or around the cuticle. File the edges of your nails with an emery board or nail file.  Do not cut corns or calluses or try to remove them with medicine.  Wear clean socks or stockings every day. Make sure they are not too tight. Do not wear knee-high stockings since they may decrease blood flow to your legs.  Wear shoes that fit properly and have enough cushioning. To break in new shoes, wear them for just a few hours a day. This prevents you from injuring your feet. Always look in your shoes before you put them on to be sure there are no objects inside.  Do not cross your legs. This may decrease the blood flow to your feet.  If you find a minor scrape,  cut, or break in the skin on your feet, keep it and the skin around it clean and dry. These areas may be cleansed with mild soap and water. Do not cleanse the area with peroxide, alcohol, or iodine.  When you remove an adhesive bandage, be sure not to damage the skin around it.  If you have a wound, look at it several times a day to make sure it is healing.  Do not use heating pads or hot water bottles. They may burn your skin. If you have lost feeling in your feet or legs, you may not know it is happening until it is too late.  Make sure your health care provider performs a complete foot exam at least annually or more often if you have foot problems. Report any cuts, sores, or bruises to your health care provider immediately. SEEK MEDICAL CARE IF:   You have an injury that is not healing.  You have cuts or breaks in the skin.  You have an ingrown nail.  You notice redness on your legs or feet.  You feel burning or tingling in your legs or feet.  You have pain or cramps in your legs and feet.  Your legs or feet are numb.  Your feet always feel cold. SEEK IMMEDIATE MEDICAL CARE IF:   There is increasing redness,   swelling, or pain in or around a wound.  There is a red line that goes up your leg.  Pus is coming from a wound.  You develop a fever or as directed by your health care provider.  You notice a bad smell coming from an ulcer or wound.   This information is not intended to replace advice given to you by your health care provider. Make sure you discuss any questions you have with your health care provider.   Document Released: 02/03/2000 Document Revised: 10/08/2012 Document Reviewed: 07/15/2012 Elsevier Interactive Patient Education 2016 [employer]  

## 2015-12-05 DIAGNOSIS — J9601 Acute respiratory failure with hypoxia: Secondary | ICD-10-CM | POA: Diagnosis not present

## 2015-12-05 DIAGNOSIS — R0600 Dyspnea, unspecified: Secondary | ICD-10-CM | POA: Diagnosis not present

## 2015-12-15 IMAGING — CR DG CHEST 2V
2 series · 2 of 2 positions shown · non-contrast
Comparison: 09/11/2014

CLINICAL DATA: Acute respiratory failure and hypoxia.

EXAM:
CHEST  2 VIEW

[w chest lat]
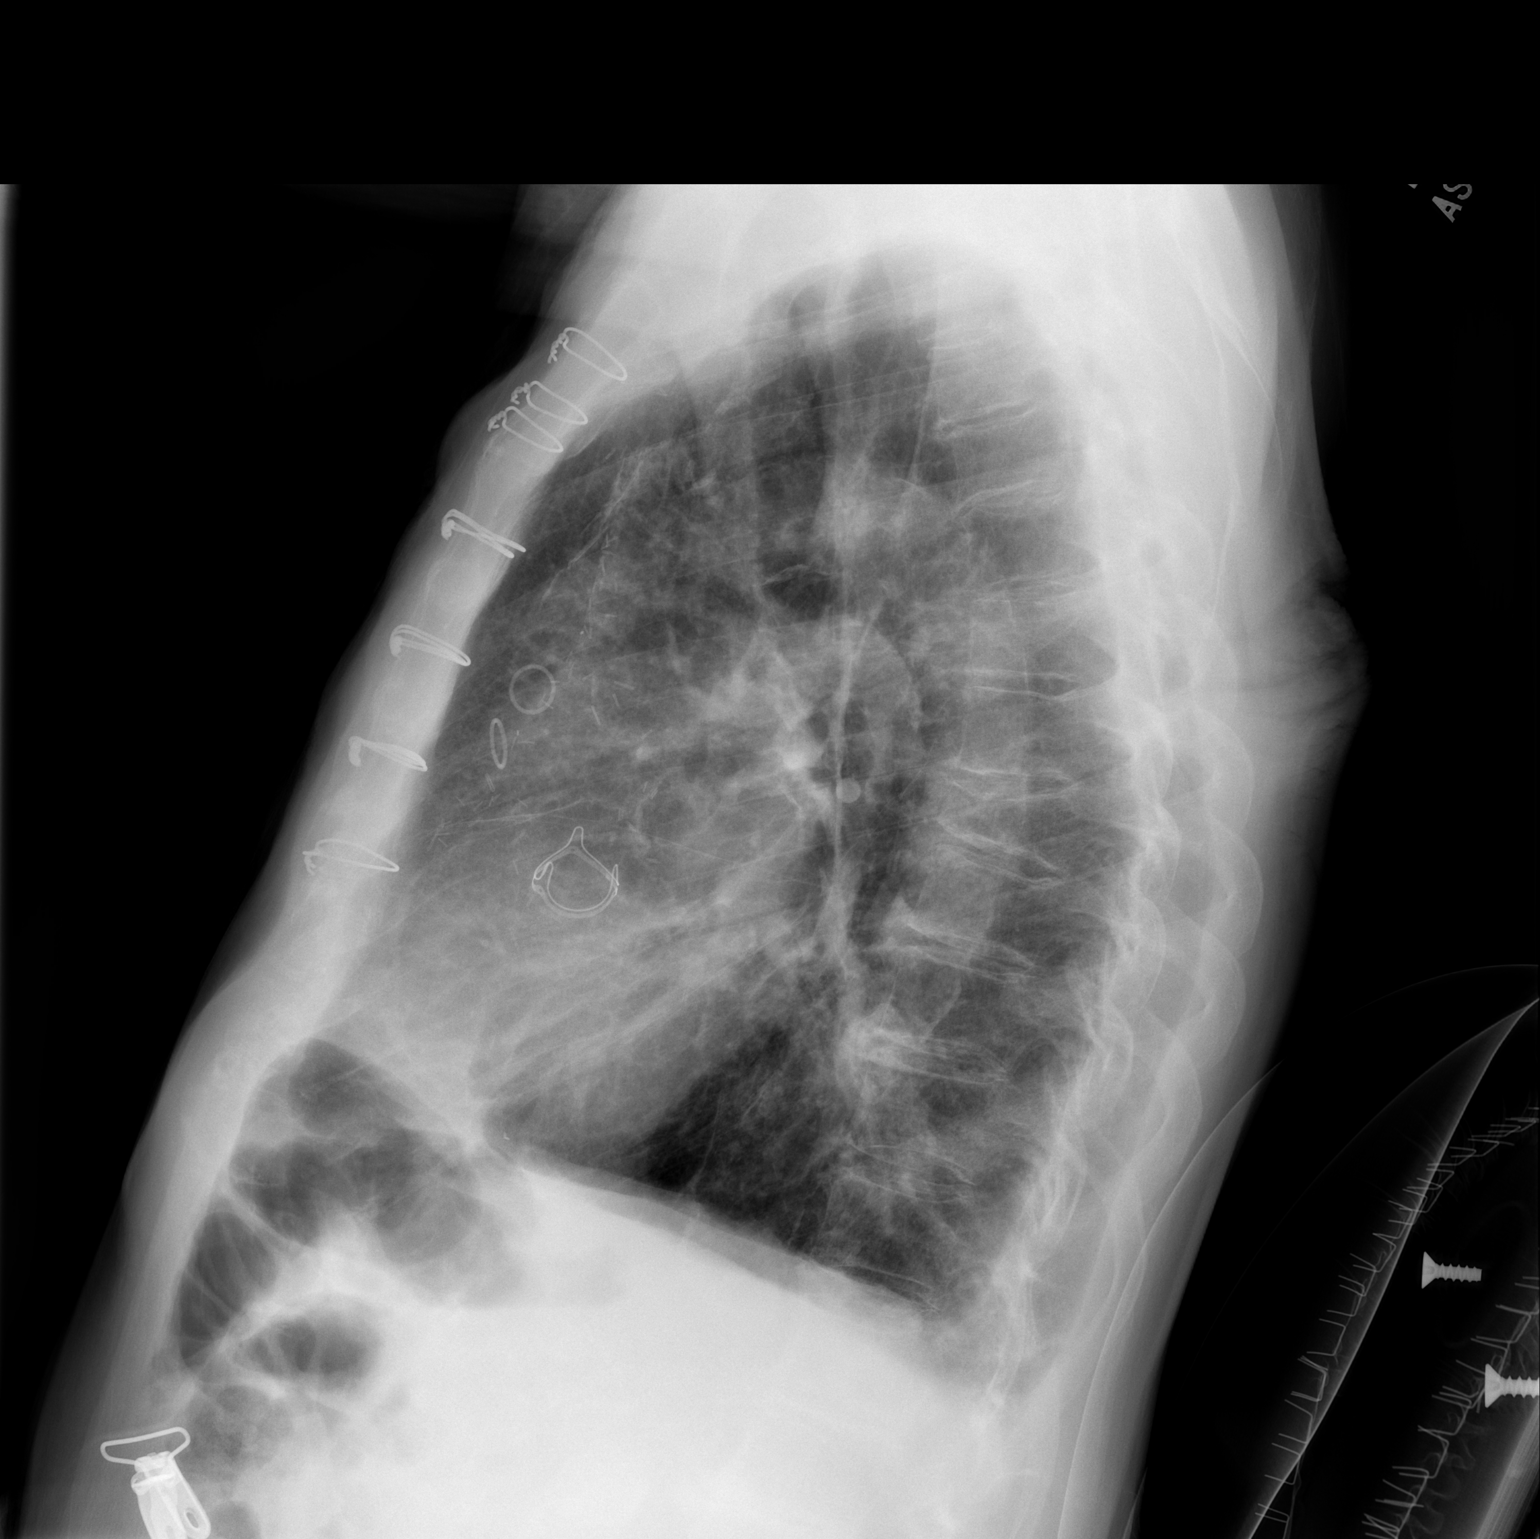

[w chest ap]
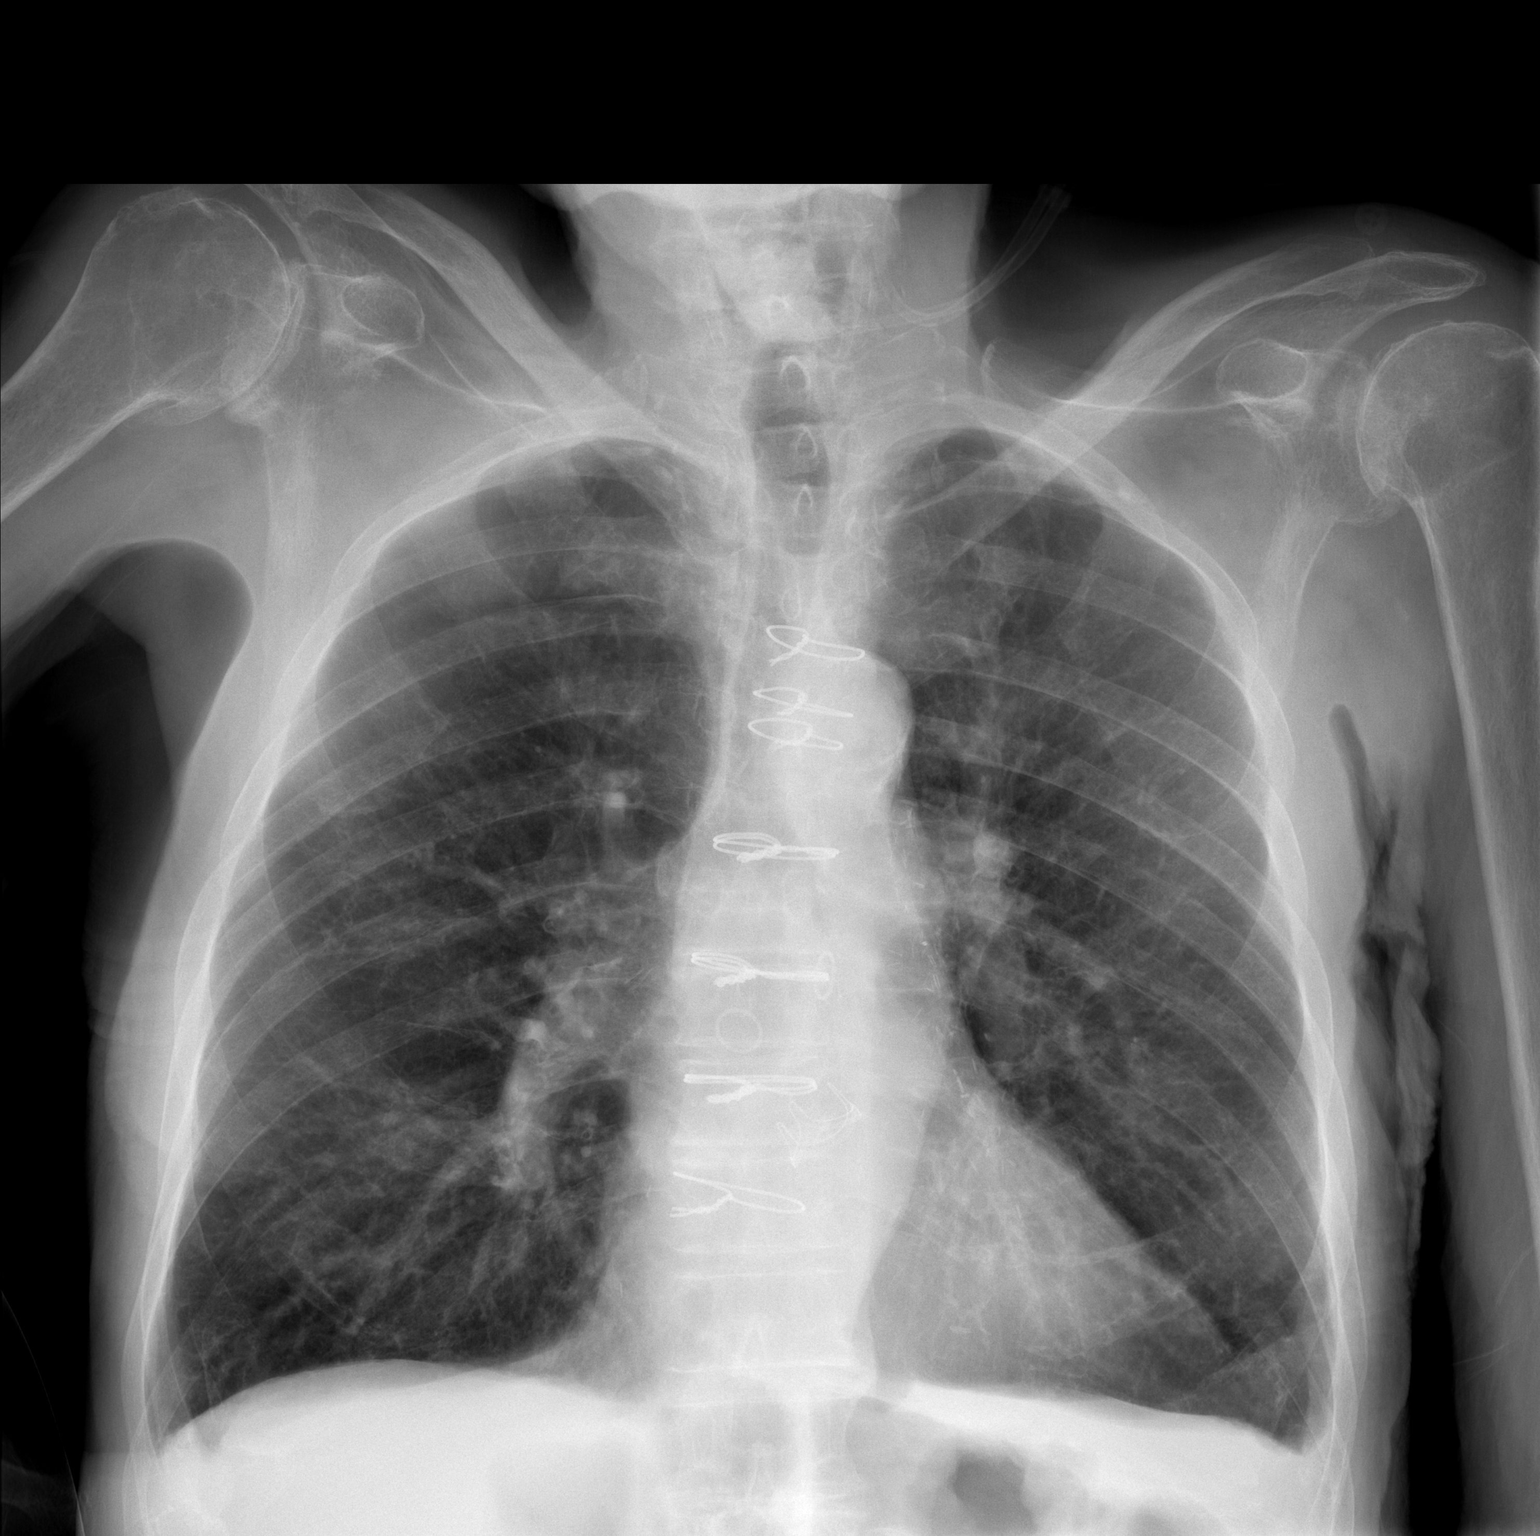

[2 of 2 positions shown; findings below may reference images not displayed]

FINDINGS: Changes from median sternotomy, CABG and aortic valve replacement
are stable.

Cardiomediastinal silhouette is normal. Mediastinal contours appear
intact. Thoracic aorta is torturous and calcified.

There is no evidence of focal airspace consolidation, or
pneumothorax. There is mild coarsening of the interstitial markings.
There are small bilateral pleural effusions.

Osseous structures are without acute abnormality. Soft tissues are
grossly normal.
IMPRESSION: Probable interstitial pulmonary edema with small bilateral pleural
effusions.

## 2015-12-18 ENCOUNTER — Other Ambulatory Visit: Payer: Self-pay | Admitting: Family Medicine

## 2015-12-18 DIAGNOSIS — I5021 Acute systolic (congestive) heart failure: Secondary | ICD-10-CM

## 2016-01-05 DIAGNOSIS — J9601 Acute respiratory failure with hypoxia: Secondary | ICD-10-CM | POA: Diagnosis not present

## 2016-01-05 DIAGNOSIS — R0600 Dyspnea, unspecified: Secondary | ICD-10-CM | POA: Diagnosis not present

## 2016-01-14 ENCOUNTER — Other Ambulatory Visit: Payer: Self-pay | Admitting: Family Medicine

## 2016-01-14 DIAGNOSIS — I5021 Acute systolic (congestive) heart failure: Secondary | ICD-10-CM

## 2016-02-04 DIAGNOSIS — R0600 Dyspnea, unspecified: Secondary | ICD-10-CM | POA: Diagnosis not present

## 2016-02-04 DIAGNOSIS — J9601 Acute respiratory failure with hypoxia: Secondary | ICD-10-CM | POA: Diagnosis not present

## 2016-02-06 ENCOUNTER — Ambulatory Visit (INDEPENDENT_AMBULATORY_CARE_PROVIDER_SITE_OTHER): Payer: Medicare Other | Admitting: Podiatry

## 2016-02-06 DIAGNOSIS — L608 Other nail disorders: Secondary | ICD-10-CM

## 2016-02-06 DIAGNOSIS — M79609 Pain in unspecified limb: Secondary | ICD-10-CM

## 2016-02-06 DIAGNOSIS — E0843 Diabetes mellitus due to underlying condition with diabetic autonomic (poly)neuropathy: Secondary | ICD-10-CM

## 2016-02-06 DIAGNOSIS — B351 Tinea unguium: Secondary | ICD-10-CM

## 2016-02-06 DIAGNOSIS — L603 Nail dystrophy: Secondary | ICD-10-CM

## 2016-02-06 NOTE — Progress Notes (Signed)
SUBJECTIVE Patient with a history of diabetes mellitus presents to office today complaining of elongated, thickened nails. Pain while ambulating in shoes. Patient is unable to trim their own nails.   No Known Allergies  OBJECTIVE General Patient is awake, alert, and oriented x 3 and in no acute distress. Derm Skin is dry and supple bilateral. Negative open lesions or macerations. Remaining integument unremarkable. Nails are tender, long, thickened and dystrophic with subungual debris, consistent with onychomycosis, 1-5 bilateral. No signs of infection noted. Vasc  DP and PT pedal pulses palpable bilaterally. Temperature gradient within normal limits.  Neuro Epicritic and protective threshold sensation diminished bilaterally.  Musculoskeletal Exam No symptomatic pedal deformities noted bilateral. Muscular strength within normal limits.  ASSESSMENT 1. Diabetes Mellitus w/ peripheral neuropathy 2. Onychomycosis of nail due to dermatophyte bilateral 3. Pain in foot bilateral  PLAN OF CARE 1. Patient evaluated today. 2. Instructed to maintain good pedal hygiene and foot care. Stressed importance of controlling blood sugar.  3. Mechanical debridement of nails 1-5 bilaterally performed using a nail nipper. Filed with dremel without incident.  4. Return to clinic in 3 mos.     Louise Victory M. Zelma Mazariego, DPM [facility]  Dr. Kaitlen Redford M. Noa Constante, DPM   [address]                                        [place], [place] [address]                Office [phone]  Fax [phone]    

## 2016-02-10 ENCOUNTER — Other Ambulatory Visit: Payer: Self-pay | Admitting: Family Medicine

## 2016-03-06 DIAGNOSIS — J9601 Acute respiratory failure with hypoxia: Secondary | ICD-10-CM | POA: Diagnosis not present

## 2016-03-06 DIAGNOSIS — R0600 Dyspnea, unspecified: Secondary | ICD-10-CM | POA: Diagnosis not present

## 2016-03-16 ENCOUNTER — Other Ambulatory Visit: Payer: Self-pay | Admitting: Family Medicine

## 2016-03-29 ENCOUNTER — Other Ambulatory Visit: Payer: Self-pay | Admitting: Cardiovascular Disease

## 2016-03-29 ENCOUNTER — Ambulatory Visit (INDEPENDENT_AMBULATORY_CARE_PROVIDER_SITE_OTHER): Payer: Medicare Other | Admitting: Family Medicine

## 2016-03-29 ENCOUNTER — Other Ambulatory Visit: Payer: Self-pay | Admitting: Family Medicine

## 2016-03-29 ENCOUNTER — Encounter: Payer: Self-pay | Admitting: Family Medicine

## 2016-03-29 VITALS — BP 130/64 | HR 76 | Temp 98.3°F | Resp 12 | Ht 65.0 in | Wt 131.0 lb

## 2016-03-29 DIAGNOSIS — R310 Gross hematuria: Secondary | ICD-10-CM | POA: Diagnosis not present

## 2016-03-29 DIAGNOSIS — I10 Essential (primary) hypertension: Secondary | ICD-10-CM

## 2016-03-29 DIAGNOSIS — I5021 Acute systolic (congestive) heart failure: Secondary | ICD-10-CM | POA: Diagnosis not present

## 2016-03-29 DIAGNOSIS — E119 Type 2 diabetes mellitus without complications: Secondary | ICD-10-CM

## 2016-03-29 LAB — URINALYSIS, ROUTINE W REFLEX MICROSCOPIC
Bilirubin Urine: NEGATIVE
GLUCOSE, UA: NEGATIVE
HGB URINE DIPSTICK: NEGATIVE
Ketones, ur: NEGATIVE
LEUKOCYTES UA: NEGATIVE
NITRITE: NEGATIVE
Protein, ur: NEGATIVE
pH: 6 (ref 5.0–8.0)

## 2016-03-29 MED ORDER — AMLODIPINE BESYLATE 10 MG PO TABS: 5.0000 mg | ORAL_TABLET | Freq: Every day | ORAL | 3 refills | Status: AC

## 2016-03-29 NOTE — Progress Notes (Signed)
Subjective:    Patient ID: [name], male    DOB: November 26, 1921, 83 y.o.   MRN: [id number]  Medication Refill   11/04/14 Patient is a very pleasant 83 year old white male who presents with dyspnea on exertion. It has been recently worsening over the last few days. Walking to the exam room on room air he is 81%. At rest on 2 L via nasal cannula, his oxygen saturation rises to 97%. Patient denies chest pain. He does have a history of coronary artery disease status post CABG. There is no edema. His weight is up 3 pounds since his last office visit but on physical exam today there is no evidence of fluid overload. He does have faint bibasilar crackles left greater than right. He denies any angina. He denies any orthopnea. He only reports dyspnea on exertion.  At that time, my plan was: Based on his physical exam, I am suspicious the patient has left lower lobe pneumonia. I will check a fingerstick hemoglobin to rule out severe anemia. I will check an EKG. I will also order a chest x-ray. If pneumonia is confirmed, we'll try outpatient treatment with levaquin and oxygen at home as clinically the patient appears well.    Hemoglobin is 11.7 and I do not expect this would explain his dyspnea on exertion. EKG shows Q waves in aVL which are old. It also shows Q waves in the precordial leads which are old. There is some mild ST segment depression and nonspecific changes in the inferior leads which are old compared to an EKG obtained in August 2016. Therefore there is no change in his EKG. I will send the patient for a chest x-ray.  I will arrange for the patient's oxygen at home  2 L via nasal cannula. I will await the results of the chest x-ray.  Patient's sats dropped to 81% with exertion on room air. On 2 L via nasal cannula at rest, the patient's saturations were 93%. With exertion on 2 L via nasal cannula, oxygen saturations were 97%.  11/08/14 CXR revealed: Probable interstitial pulmonary edema with  small bilateral pleural effusions. My plan was: X-ray shows pulmonary edema and small pleural effusions. This may explain the abnormalities heard on his pulm exam. Increase Lasix to 40 mg twice a day and potassium to 20 mEq twice a day. Wear oxygen at home. Recheck on Monday. Patient will likely need an echocardiogram. Go to the hospital if worsening.  Patient states that his breathing is much better since increasing his fluid pills. He has lost 5 pounds since I increased his fluid pills. His dyspnea on exertion has improved dramatically. He is compliant with wearing his oxygen. He denies any chest pain.  At that time, my plan was: Discontinue amlodipine. Discontinue potassium. Decrease fluid pill back to 20 mg by mouth daily. Continue with oxygen. Recheck in one week  11/15/14 Patient is here for follow up.  He has gained 2 lbs since his OV 1 week ago.  O2 sats are 97% on 2 L, 83% on RA at rest.  He feels great on oxygen.  BP has risen substantially off amlodipine.  At that time, my plan was: Increase lasix to 40 mg poqday.  Stay off potassium and recheck BMP in 1 month.  Resume amlodipine 5 mg poqday.  Continue O2 2 L via [place].  Recheck in 1 month. Wt Readings from Last 3 Encounters:  03/29/16 131 lb (59.4 kg)  09/22/15 135 lb (61.2 kg)  09/16/15  134 lb (60.8 kg)   12/16/14 Patient is here today for follow-up. His weight is stable since last office visit at 130 pounds. He has no pitting edema in his legs. There are no rails that I can appreciate in his lungs. He states that his shortness of breath is much improved. He denies any dyspnea at rest. He denies any dyspnea on exertion. Overall he is doing well and his blood pressure is much better today at 122/58. Clinically he seems to be doing very well.  At that time, my plan was: Clinically, the patient's medication regimen seems to be perfect. I will check his BMP to monitor his renal function and potassium. If stable I will continue Lasix 40 mg every  day and recheck the patient back in 3 months  03/24/15 He continues to thrive at home.  His weight is up 6 pounds but he is not retaining any fluid in his legs. Furthermore his lungs are clear. His oxygen saturations are 99% on oxygen.  He denies any shortness of breath or chest pain or palpitations. His daughter is cooking for him now on a daily basis and that is why he has gained weight because he is eating better. His strength is improved. He denies any pain. He denies any stomach problems. He denies any constipation or diarrhea. He is overdue to check fasting lab work. He is eating today and therefore I hate to make him come back. But I would like to recheck his hemoglobin A1c to monitor his diabetes and his blood pressure today is well controlled at 120/80. He denies any symptoms of hypoglycemia. At present his diabetes is controlled through diet. At that time, my plan was: I am pleasantly surprised by how well the patient is doing. I believe the 40 mg a day of Lasix is the appropriate dose of diuretic that is maintaining his fluid status. There is no evidence of fluid overload on exam or pulmonary edema. His oxygen is stable. His blood pressure is excellent. Therefore I will make no changes in his medication at this time.  I will check a hemoglobin A1c to ensure that his diabetes is adequately controlled. Given his advanced age, I would not start medication for diabetes mellitus his hemoglobin A1c was well above 8. I will also check a CBC to monitor for anemia and check a CMP to monitor his kidney and liver function along with his potassium. I would like to get fasting lipid panel at his convenience. His immunizations are up-to-date.  When he does come by for fasting lab work, his goal LDL cholesterol be less than 70 given his history of coronary artery disease.  09/22/15 Patient is here to a for recheck. Pulse oximetry on room air was 85%. On 2 L via nasal cannula is up to 95%. His lungs are clear to  auscultation bilaterally. There are no wheezes crackles [place]. Please see my last office visit. The skin tear on his left knee is healing nicely. It now just needs a simple Band-Aid. He continues to have decreased range of motion in his right shoulder. Abduction is limited to 90. However he denies any pain. He is due for lab work.  AT that time, my plan was: Patient appears euvolemic today. There is no pitting edema. His lungs are clear. I will make no changes in his dose of medication. His blood pressures well controlled. He does require 2 L of oxygen via nasal cannula due to chronic hypoxemia. I will check a hemoglobin  A1c along with a CBC and a CMP. If lab work is excellent, I would just recheck the patient in 6 months or as needed  03/29/16 Patient is here today for follow-up. Overall he is been doing very well. Denies any chest pain or dyspnea on exertion. He uses his oxygen at night when he sleeps. He denies any orthopnea or paroxysmal nocturnal dyspnea. He denies any pitting edema in his extremities. Both he and his daughter report one episode of gross hematuria that occurred yesterday. Daughter states that she could still see the yellow tinged urine but it was extremely bright red mixed with blood.  He denies any dysuria. He denies any low back pain. He denies any frequency or urgency. This is been the only time this is occurred.  Patient appears euvolemic. His lungs are clear. There is no pitting edema. His weight has remained relatively stable over the last 6 months. In fact he is down 3 or 4 pounds. Wt Readings from Last 3 Encounters:  03/29/16 131 lb (59.4 kg)  09/22/15 135 lb (61.2 kg)  09/16/15 134 lb (60.8 kg)     Past Medical History:  Diagnosis Date  . CAD (coronary artery disease)   . Carotid stenosis 09/24/11   right bulb/proximal ICA demonstrates a mild amont of fibrous plaque elevating velocitieswithin the proximal and mid segments of the internal carotid artery. Left CEA  demonstrats a trace amount of fibrous plaque with  no evidence of a significant diameter reduction, tortuosity or any other vascular abnormality.  . Cataract   . Diabetes mellitus   . Hyperlipidemia 06/02/08   Nuclear stress test- high risk scan EF 29% Post stress LV cavity enlargement suggests multi vessel disease.  Marland Kitchen Hypertension 08/28/09   ECHO- EF 50-55%  . Stroke [facility])    Past Surgical History:  Procedure Laterality Date  . APPENDECTOMY    . CARDIAC CATHETERIZATION  06/10/2008  . CAROTID ENDARTERECTOMY     with darcon patch angioplasty 03/17/2001  . CARPAL TUNNEL RELEASE     Left  . CORONARY ARTERY BYPASS GRAFT     + AV replacement  . TEE WITHOUT CARDIOVERSION N/A 08/15/2012   Procedure: TRANSESOPHAGEAL ECHOCARDIOGRAM (TEE);  Surgeon: Pricilla Riffle, MD;  Location: [facility] ENDOSCOPY;  Service: Cardiovascular;  Laterality: N/A;   Current Outpatient Prescriptions on File Prior to Visit  Medication Sig Dispense Refill  . aspirin EC 81 MG tablet Take 81 mg by mouth daily.    Marland Kitchen atorvastatin (LIPITOR) 80 MG tablet TAKE 1 TABLET BY MOUTH EVERY DAY 30 tablet 11  . benazepril (LOTENSIN) 40 MG tablet TAKE 1 TABLET(40 MG) BY MOUTH DAILY 90 tablet 3  . carvedilol (COREG) 12.5 MG tablet TAKE 1 TABLET BY MOUTH TWICE DAILY WITH A MEAL 180 tablet 1  . clopidogrel (PLAVIX) 75 MG tablet TAKE 1 TABLET BY MOUTH EVERY DAY 30 tablet 11  . furosemide (LASIX) 20 MG tablet TAKE 2 TABLETS(40 MG) BY MOUTH DAILY 60 tablet 3  . pyridOXINE (VITAMIN B-6) 100 MG tablet Take 100 mg by mouth daily.    Marland Kitchen VITAMIN D, CHOLECALCIFEROL, PO Take 1,000 Units by mouth daily.       No current facility-administered medications on file prior to visit.    No Known Allergies Social History   Social History  . Marital status: Widowed    Spouse name: N/A  . Number of children: 2  . Years of education: 10   Occupational History  . retired    Social History  Main Topics  . Smoking status: Former Smoker    Quit date:  02/28/1978  . Smokeless tobacco: Never Used  . Alcohol use No  . Drug use: No  . Sexual activity: Not on file   Other Topics Concern  . Not on file   Social History Narrative  . No narrative on file      Review of Systems  All other systems reviewed and are negative.      Objective:   Physical Exam  Constitutional: He appears well-developed and well-nourished.  Neck: No JVD present.  Cardiovascular: Normal rate, regular rhythm and normal heart sounds.   Pulmonary/Chest: Effort normal.  Abdominal: Soft. Bowel sounds are normal. He exhibits no distension. There is no tenderness. There is no rebound.  Musculoskeletal: He exhibits no edema.  Vitals reviewed.         Assessment & Plan:  Controlled type 2 diabetes mellitus without complication, without long-term current use of insulin (HCC) - Plan: CBC with Differential/Platelet, COMPLETE METABOLIC PANEL WITH GFR, Hemoglobin A1c  Benign essential HTN  CHF (congestive heart failure), NYHA class III, acute, systolic (HCC)  From the standpoint of his congestive heart failure, the patient is euvolemic. He is not requiring more oxygen. He denies any orthopnea or PND. There is no pitting edema and there is no evidence of pulmonary edema on his exam. Therefore there are no indications for medication changes at this time. His blood pressures well controlled at 130/64. Regarding his diabetes I am being very conservative in management of this. I will be willing to except a hemoglobin A1c less than 8 because of his age. I will check that today. I will also check a CBC given the fact the patient's ongoing antiplatelet therapy.  Patient is on dual antiplatelet therapy due to his history of coronary artery disease as well as his history of a CVA while taking aspirin alone. The hematuria could be due to a urinary tract infection, kidney stone, insignificant irritation, or worse Sharpe scenario bladder cancer. I will perform a urinalysis today to  rule out a urinary tract infection. If urine sample shows no evidence of urinary tract infection, I would recommend just clinical monitoring. If the hematuria persists, I would discontinue dual antiplatelet therapy. I would have the patient discontinue Plavix and remain just on aspirin. Given his age, I would not proceed with a cystoscopy or CT of the kidneys unless symptoms are persistent and worsening. Fortunately, urinalysis today is completely clear. There is no blood, leukocyte esterase, or nitrites. Therefore, we have determined to monitor the situation clinically. If the bleeding returns or persists I will discontinue Plavix. Otherwise we will not workup further at this time

## 2016-03-30 LAB — CBC WITH DIFFERENTIAL/PLATELET
Basophils Absolute: 0 cells/uL (ref 0–200)
Basophils Relative: 0 %
EOS ABS: 114 {cells}/uL (ref 15–500)
Eosinophils Relative: 2 %
HEMATOCRIT: 34.7 % — AB (ref 38.5–50.0)
Hemoglobin: 11.2 g/dL — ABNORMAL LOW (ref 13.0–17.0)
Lymphocytes Relative: 12 %
Lymphs Abs: 684 cells/uL — ABNORMAL LOW (ref 850–3900)
MCH: 30.8 pg (ref 27.0–33.0)
MCHC: 32.3 g/dL (ref 32.0–36.0)
MCV: 95.3 fL (ref 80.0–100.0)
MONO ABS: 513 {cells}/uL (ref 200–950)
MPV: 9.2 fL (ref 7.5–12.5)
Monocytes Relative: 9 %
NEUTROS PCT: 77 %
Neutro Abs: 4389 cells/uL (ref 1500–7800)
Platelets: 172 10*3/uL (ref 140–400)
RBC: 3.64 MIL/uL — ABNORMAL LOW (ref 4.20–5.80)
RDW: 13.4 % (ref 11.0–15.0)
WBC: 5.7 10*3/uL (ref 3.8–10.8)

## 2016-03-30 LAB — HEMOGLOBIN A1C
Hgb A1c MFr Bld: 5.8 % — ABNORMAL HIGH (ref <5.7)
MEAN PLASMA GLUCOSE: 120 mg/dL

## 2016-03-30 LAB — COMPLETE METABOLIC PANEL WITH GFR
ALT: 13 U/L (ref 9–46)
AST: 20 U/L (ref 10–35)
Albumin: 3.2 g/dL — ABNORMAL LOW (ref 3.6–5.1)
Alkaline Phosphatase: 47 U/L (ref 40–115)
BUN: 27 mg/dL — ABNORMAL HIGH (ref 7–25)
CHLORIDE: 102 mmol/L (ref 98–110)
CO2: 32 mmol/L — ABNORMAL HIGH (ref 20–31)
Calcium: 8.5 mg/dL — ABNORMAL LOW (ref 8.6–10.3)
Creat: 1.18 mg/dL — ABNORMAL HIGH (ref 0.70–1.11)
GFR, EST AFRICAN AMERICAN: 62 mL/min (ref >=60)
GFR, Est Non African American: 53 mL/min — ABNORMAL LOW (ref >=60)
Glucose, Bld: 165 mg/dL — ABNORMAL HIGH (ref 70–99)
POTASSIUM: 4.2 mmol/L (ref 3.5–5.3)
Sodium: 144 mmol/L (ref 135–146)
Total Bilirubin: 0.4 mg/dL (ref 0.2–1.2)
Total Protein: 6.3 g/dL (ref 6.1–8.1)

## 2016-04-05 ENCOUNTER — Ambulatory Visit (INDEPENDENT_AMBULATORY_CARE_PROVIDER_SITE_OTHER): Payer: Medicare Other | Admitting: Cardiovascular Disease

## 2016-04-05 ENCOUNTER — Encounter: Payer: Self-pay | Admitting: Cardiovascular Disease

## 2016-04-05 VITALS — BP 106/52 | HR 72 | Ht 65.0 in | Wt 129.0 lb

## 2016-04-05 DIAGNOSIS — I2510 Atherosclerotic heart disease of native coronary artery without angina pectoris: Secondary | ICD-10-CM

## 2016-04-05 DIAGNOSIS — I10 Essential (primary) hypertension: Secondary | ICD-10-CM | POA: Diagnosis not present

## 2016-04-05 DIAGNOSIS — I2583 Coronary atherosclerosis due to lipid rich plaque: Secondary | ICD-10-CM

## 2016-04-05 DIAGNOSIS — Z8673 Personal history of transient ischemic attack (TIA), and cerebral infarction without residual deficits: Secondary | ICD-10-CM

## 2016-04-05 DIAGNOSIS — Z952 Presence of prosthetic heart valve: Secondary | ICD-10-CM | POA: Diagnosis not present

## 2016-04-05 DIAGNOSIS — E785 Hyperlipidemia, unspecified: Secondary | ICD-10-CM

## 2016-04-05 NOTE — Patient Instructions (Signed)
Your physician recommends that you return for lab work FASTING.  Your physician recommends that you schedule a follow-up appointment AND ECHO IN 6 MONTHS.

## 2016-04-05 NOTE — Progress Notes (Signed)
Patient ID: [name], male   DOB: 07/25/1921, 83 y.o.   MRN: [id number]    Primary M.D.: Dr. Warren Pickard  HPI: [name], is a 83 y.o. male who is a former patient of Dr. Al Little  presents to the office for a 13 month  cardiology evaluation.  [name] has a history of known coronary as well as valvular disease. In April 2010 was found to have severe coronary artery disease with 95% left main stenosis, segmental 95 and 90% circumflex stenoses, and total occlusion of the proximal RCA with left to right collaterals. I  Performed cardiac catheterization at that time and he also had a 22 mm peak to peak gradient. He underwent CABG revascularization surgery with a LIMA to the LAD, SVG to the diagonal, SVG to the circumflex, and an SVG to the PDA and underwent tissue valve replacement for his aortic stenosis. The following year he suffered a CVA in May 2011.  In 2013 he suffered a TIA with almost complete resolution of symptoms. He had undergone prior  left carotid endarterectomy. He last saw Dr. Little in July 2013. When I saw him initially he denied any recent chest pain. He did note ankle swelling right greater than left and I adjusted his diuretic therapy. An echo Doppler study on 07/29/2012 showed an ejection fraction in the 55-60% range. The possibility for mild basal posterior hypokinesis could not be excluded. He did have grade 1 diastolic dysfunction. His bioprosthetic aortic valve was well-seated. There was no aortic insufficiency. He did have mild left atrial enlargement and mild pulmonary hypertension with estimated PA pressure 40 mm. Mild to moderate TR.  [name]  presented to [place] emergency room on August 13, 2012 and was evaluated by by Dr. Steven Rancourt with complaints of difficulty walking, right leg heaviness, and worsening slurred speech. He was felt to have acute stroke on MRI and neurology was consulted. His Plavix was continued. On CT scan he was found to have a subtle  small acute nonhemorrhagic infarct in the posterior left corona radiata extending into the posterior limb of the left internal capsule.  There were also was evidence for remote bilateral frontal lobe infarcts and right occipital lobe infarct with encephalomalacia.  There was no evidence for intracranial hemorrhage. A limited subsequent TEE did not show any evidence for left atrial thrombus. There is no PFO by color Doppler, with agitated saline a few bubbles was seen later and a PFO could not be completely excluded. During that hospitalization he was taken off Lasix.   His last carotid study was in June 2014 which showed 60-79% stenosis involving the right internal carotid artery, 40-59%.  She stenosis involving the left internal carotid artery.  Since I last saw him one year ago, he has continued to be without chest pain.   He denies any episodes of dizziness.  In February 2017.  He underwent a follow-up echo Doppler study which showed normal systolic function with an EF of 50-55%.  There was mild mid and basal inferior hypokinesis.  His bioprosthetic aortic valve had normal gradients and was without perivalvular leak.  He had mild MR.  He was seen by Dr. Pickard earlier this month and at that time had noticed some very mild shortness of breath.  His ECG was stable.  He underwent a chest x-ray.  He had desaturation with exertion and was placed on home O2.  He had one episode of hematuria, which may have been due   to urinary tract infection.  He denies any recurrent episodes.  He continues to be on aspirin and Plavix for dual antiplatelet therapy, and he is now on amlodipine 5 mg, benazapril 40 mg, carvedilol 12.5 mg twice a day, furosemide 40 mg in addition to atorvastatin 80 mg.  His vision is poor following his stroke. He denies any episodes of chest pain, palpitations, presyncope or syncope or episodes of CHF. He is living by himself, but he lives next door to his daughter who cares for him.  He presents for  evaluation  Past Medical History:  Diagnosis Date  . CAD (coronary artery disease)   . Carotid stenosis 09/24/11   right bulb/proximal ICA demonstrates a mild amont of fibrous plaque elevating velocitieswithin the proximal and mid segments of the internal carotid artery. Left CEA demonstrats a trace amount of fibrous plaque with  no evidence of a significant diameter reduction, tortuosity or any other vascular abnormality.  . Cataract   . Diabetes mellitus   . Hyperlipidemia 06/02/08   Nuclear stress test- high risk scan EF 29% Post stress LV cavity enlargement suggests multi vessel disease.  . Hypertension 08/28/09   ECHO- EF 50-55%  . Stroke (HCC)     Past Surgical History:  Procedure Laterality Date  . APPENDECTOMY    . CARDIAC CATHETERIZATION  06/10/2008  . CAROTID ENDARTERECTOMY     with darcon patch angioplasty 03/17/2001  . CARPAL TUNNEL RELEASE     Left  . CORONARY ARTERY BYPASS GRAFT     + AV replacement  . TEE WITHOUT CARDIOVERSION N/A 08/15/2012   Procedure: TRANSESOPHAGEAL ECHOCARDIOGRAM (TEE);  Surgeon: Paula V Ross, MD;  Location: [facility];  Service: Cardiovascular;  Laterality: N/A;    No Known Allergies  Current Outpatient Prescriptions  Medication Sig Dispense Refill  . amLODipine (NORVASC) 10 MG tablet Take 0.5 tablets (5 mg total) by mouth daily. 90 tablet 3  . aspirin EC 81 MG tablet Take 81 mg by mouth daily.    . atorvastatin (LIPITOR) 80 MG tablet TAKE 1 TABLET BY MOUTH EVERY DAY 30 tablet 11  . benazepril (LOTENSIN) 40 MG tablet TAKE 1 TABLET(40 MG) BY MOUTH DAILY 90 tablet 3  . carvedilol (COREG) 12.5 MG tablet TAKE 1 TABLET BY MOUTH TWICE DAILY WITH A MEAL 180 tablet 0  . clopidogrel (PLAVIX) 75 MG tablet TAKE 1 TABLET BY MOUTH EVERY DAY 30 tablet 0  . furosemide (LASIX) 20 MG tablet TAKE 2 TABLETS(40 MG) BY MOUTH DAILY 60 tablet 3  . pyridOXINE (VITAMIN B-6) 100 MG tablet Take 100 mg by mouth daily.    . VITAMIN D, CHOLECALCIFEROL, PO Take 1,000  Units by mouth daily.       No current facility-administered medications for this visit.    Social he is widowed he remains relatively active he has 2 children. No alcohol tobacco use.  ROS General: Negative; No fevers, chills, or night sweats;  HEENT: Vision is poor following his stroke.  or hearing, sinus congestion, difficulty swallowing Pulmonary: Negative; No cough, wheezing, shortness of breath, hemoptysis Cardiovascular: Negative; No chest pain, presyncope, syncope, palpitations GI: Negative; No nausea, vomiting, diarrhea, or abdominal pain GU: An isolated episode of hematuria which resolved Musculoskeletal: Negative; no myalgias, joint pain, or weakness Hematologic/Oncology: Negative; no easy bruising, bleeding Endocrine: Negative; no heat/cold intolerance; no diabetes Neuro: see history of present illness with remote infarct.; no changes in balance, headaches Skin: Negative; No rashes or skin lesions Psychiatric: Negative; No behavioral problems, depression Sleep:   Negative; No snoring, daytime sleepiness, hypersomnolence, bruxism, restless legs, hypnogognic hallucinations, no cataplexy Other comprehensive 14 point system review is negative.   PE BP (!) 106/52   Pulse 72   Ht 5' 5" (1.651 m)   Wt 129 lb (58.5 kg)   BMI 21.47 kg/m    Repeat blood pressure by me 110/60  Wt Readings from Last 3 Encounters:  04/05/16 129 lb (58.5 kg)  03/29/16 131 lb (59.4 kg)  09/22/15 135 lb (61.2 kg)   General: Alert, oriented, no distress.  HEENT: Normocephalic, atraumatic. Pupils round and reactive; sclera anicteric;  Nose without nasal septal hypertrophy Mouth/Parynx benign; Mallinpatti scale 3 Neck: No JVD, soft carotid bruits  bilaterally Lungs: clear to ausculatation and percussion; no wheezing or rales Heart: RRR, s1 s2 normal with a 2/6 systolic murmur in aortic region with faint aortic insufficiency  Abdomen: soft, nontender; no hepatosplenomehaly, BS+; abdominal aorta  nontender and not dilated by palpation. Pulses 2+ Extremities: trace bilateral ankle swelling, Homan's sign negative  Neurologic: grossly nonfocal Psychological: Normal mood.  ECG (independently read by me): Normal sinus rhythm at 72 bpm.  Normal intervals.  Nonspecific ST changes.  January 2017 ECG (independently read by me):  Normal sinus rhythm at 73. QRS complex V1 V2. Nonspecific T changes inferiorly.  November 2015 ECG (independently read by me):  Normal sinus rhythm with PAC, normal intervals.  Poor progression V1 through V3.  Prior July 2014 ECG: Sinus rhythm at 69 beats per minute with poor progression anteriorly V1 through V3. No ectopy.  LABS:  BMP Latest Ref Rng & Units 03/29/2016 09/22/2015 03/24/2015  Glucose 70 - 99 mg/dL 165(H) 149(H) 161(H)  BUN 7 - 25 mg/dL 27(H) 30(H) 29(H)  Creatinine 0.70 - 1.11 mg/dL 1.18(H) 1.23(H) 1.25(H)  Sodium 135 - 146 mmol/L 144 144 142  Potassium 3.5 - 5.3 mmol/L 4.2 4.3 3.8  Chloride 98 - 110 mmol/L 102 100 100  CO2 20 - 31 mmol/L 32(H) 28 35(H)  Calcium 8.6 - 10.3 mg/dL 8.5(L) 8.9 8.4(L)   Hepatic Function Latest Ref Rng & Units 03/29/2016 09/22/2015 03/24/2015  Total Protein 6.1 - 8.1 g/dL 6.3 6.6 6.4  Albumin 3.6 - 5.1 g/dL 3.2(L) 3.4(L) 3.3(L)  AST 10 - 35 U/L 20 30 24  ALT 9 - 46 U/L 13 26 15  Alk Phosphatase 40 - 115 U/L 47 56 54  Total Bilirubin 0.2 - 1.2 mg/dL 0.4 0.4 0.4  Bilirubin, Direct 0.0 - 0.3 mg/dL - - -   CBC Latest Ref Rng & Units 03/29/2016 09/22/2015 03/24/2015  WBC 3.8 - 10.8 K/uL 5.7 5.9 6.1  Hemoglobin 13.0 - 17.0 g/dL 11.2(L) 11.2(L) 11.0(L)  Hematocrit 38.5 - 50.0 % 34.7(L) 35.0(L) 33.3(L)  Platelets 140 - 400 K/uL 172 170 167   Lab Results  Component Value Date   MCV 95.3 03/29/2016   MCV 95.1 09/22/2015   MCV 93.0 03/24/2015   Lab Results  Component Value Date   TSH 1.609 09/25/2013   Lab Results  Component Value Date   HGBA1C 5.8 (H) 03/29/2016   Lipid Panel     Component Value Date/Time   CHOL 114  09/25/2013 0848   TRIG 75 09/25/2013 0848   HDL 44 09/25/2013 0848   CHOLHDL 2.6 09/25/2013 0848   VLDL 15 09/25/2013 0848   LDLCALC 55 09/25/2013 0848     RADIOLOGY: No results found.  IMPRESSION:  1. Essential hypertension   2. Coronary artery disease due to lipid rich plaque   3.   Hyperlipidemia LDL goal <70   4. H/O aortic valve replacement   5. History of stroke     ASSESSMENT AND PLAN: [name] is a 83-year-old years gentleman who is status post CABG surgery as well as tissue aortic valve replacement surgery in 2010.  He has a history of hyperlipidemia as well as hypertension    An echo Doppler assessment in 2014 demonstrated a competent bioprosthetic aortic valve with normal systolic function with grade 1 diastolic dysfunction.  I again reviewed his last echo Doppler study from 03/30/2015 was continued to show an ejection fraction of 50-55%.  His aortic valve had normal gradients with a mean gradient of 14.  There was no aortic insufficiency.  On his CT scan 3 years ago he was  found to have a subtle small acute nonhemorrhagic infarct in the posterior left corona radiate extending into the posterior limb of the left internal capsule. There was evidence of remote bilateral frontal lobe infarcts and right occipital lobe infarct with encephalomalacia and  prominent small vessel disease type changes. There was no evidence for intracranial hemorrhage. He  is on dual antiplatelet therapy with Plavix/ASA.  Presently, he is not having anginal symptoms.  His blood pressure today is controlled now on his reduced dose of amlodipine at 5 mg in addition to his ace inhibition and beta blocker therapy as well as diuretic regimen.  ECG remained stable.  Except for the one episode of hematuria, this has not recurred and it was felt possibly due to urinary tract infection.  Reportedly, he had a chest x-ray, but I do not have these results.  At present he does not have any findings of overt CHF.  His valve  remains competent.  He's not having anginal symptoms.  I reviewed recent laboratory.  I am re-checking a lipid panel since this has was not assessed.  I will also check a TSH level.  In 6 months he will undergo an 18 month follow-up echo Doppler study which would be 8 years status post CABG revascularization surgery and aortic valve replacement.  I will see him in the office in follow-up.    Time spent: 25 minutes   A. , MD, FACC  04/06/2016 7:23 AM    

## 2016-04-06 DIAGNOSIS — R0600 Dyspnea, unspecified: Secondary | ICD-10-CM | POA: Diagnosis not present

## 2016-04-06 DIAGNOSIS — J9601 Acute respiratory failure with hypoxia: Secondary | ICD-10-CM | POA: Diagnosis not present

## 2016-04-06 DIAGNOSIS — Z952 Presence of prosthetic heart valve: Secondary | ICD-10-CM | POA: Insufficient documentation

## 2016-04-23 ENCOUNTER — Other Ambulatory Visit (HOSPITAL_COMMUNITY): Payer: Medicare Other

## 2016-04-23 ENCOUNTER — Other Ambulatory Visit: Payer: Self-pay | Admitting: Family Medicine

## 2016-04-23 DIAGNOSIS — E785 Hyperlipidemia, unspecified: Secondary | ICD-10-CM

## 2016-04-23 DIAGNOSIS — I10 Essential (primary) hypertension: Secondary | ICD-10-CM

## 2016-04-25 ENCOUNTER — Telehealth: Payer: Self-pay | Admitting: Family Medicine

## 2016-04-25 NOTE — Telephone Encounter (Signed)
error 

## 2016-04-26 ENCOUNTER — Ambulatory Visit (HOSPITAL_COMMUNITY): Payer: Medicare Other | Attending: Internal Medicine

## 2016-04-26 DIAGNOSIS — I253 Aneurysm of heart: Secondary | ICD-10-CM | POA: Diagnosis not present

## 2016-04-26 DIAGNOSIS — Z952 Presence of prosthetic heart valve: Secondary | ICD-10-CM

## 2016-04-26 DIAGNOSIS — I348 Other nonrheumatic mitral valve disorders: Secondary | ICD-10-CM | POA: Diagnosis not present

## 2016-04-26 DIAGNOSIS — I501 Left ventricular failure: Secondary | ICD-10-CM | POA: Diagnosis not present

## 2016-04-27 ENCOUNTER — Other Ambulatory Visit: Payer: Self-pay | Admitting: Family Medicine

## 2016-04-27 ENCOUNTER — Other Ambulatory Visit: Payer: Medicare Other

## 2016-04-27 DIAGNOSIS — I10 Essential (primary) hypertension: Secondary | ICD-10-CM

## 2016-04-27 DIAGNOSIS — E785 Hyperlipidemia, unspecified: Secondary | ICD-10-CM

## 2016-04-27 LAB — LIPID PANEL
CHOL/HDL RATIO: 2.2 ratio (ref <5.0)
CHOLESTEROL: 133 mg/dL (ref <200)
HDL: 60 mg/dL (ref >40)
LDL Cholesterol: 60 mg/dL (ref <100)
TRIGLYCERIDES: 66 mg/dL (ref <150)
VLDL: 13 mg/dL (ref <30)

## 2016-04-27 LAB — TSH: TSH: 2.42 mIU/L (ref 0.40–4.50)

## 2016-05-04 DIAGNOSIS — J9601 Acute respiratory failure with hypoxia: Secondary | ICD-10-CM | POA: Diagnosis not present

## 2016-05-04 DIAGNOSIS — R0600 Dyspnea, unspecified: Secondary | ICD-10-CM | POA: Diagnosis not present

## 2016-05-07 ENCOUNTER — Ambulatory Visit (INDEPENDENT_AMBULATORY_CARE_PROVIDER_SITE_OTHER): Payer: Medicare Other | Admitting: Podiatry

## 2016-05-07 DIAGNOSIS — M79609 Pain in unspecified limb: Secondary | ICD-10-CM

## 2016-05-07 DIAGNOSIS — L608 Other nail disorders: Secondary | ICD-10-CM

## 2016-05-07 DIAGNOSIS — B351 Tinea unguium: Secondary | ICD-10-CM | POA: Diagnosis not present

## 2016-05-07 DIAGNOSIS — E0843 Diabetes mellitus due to underlying condition with diabetic autonomic (poly)neuropathy: Secondary | ICD-10-CM | POA: Diagnosis not present

## 2016-05-07 DIAGNOSIS — L603 Nail dystrophy: Secondary | ICD-10-CM | POA: Diagnosis not present

## 2016-05-07 NOTE — Progress Notes (Signed)
   SUBJECTIVE Patient with a history of diabetes mellitus presents to office today complaining of elongated, thickened nails. Pain while ambulating in shoes. Patient is unable to trim their own nails.   OBJECTIVE General Patient is awake, alert, and oriented x 3 and in no acute distress. Derm Skin is dry and supple bilateral. Negative open lesions or macerations. Remaining integument unremarkable. Nails are tender, long, thickened and dystrophic with subungual debris, consistent with onychomycosis, 1-5 bilateral. No signs of infection noted. Vasc  DP and PT pedal pulses palpable bilaterally. Temperature gradient within normal limits.  Neuro Epicritic and protective threshold sensation diminished bilaterally.  Musculoskeletal Exam No symptomatic pedal deformities noted bilateral. Muscular strength within normal limits.  ASSESSMENT 1. Diabetes Mellitus w/ peripheral neuropathy 2. Onychomycosis of nail due to dermatophyte bilateral 3. Pain in foot bilateral  PLAN OF CARE 1. Patient evaluated today. 2. Instructed to maintain good pedal hygiene and foot care. Stressed importance of controlling blood sugar.  3. Mechanical debridement of nails 1-5 bilaterally performed using a nail nipper. Filed with dremel without incident.  4. Return to clinic in 3 mos.     Sorin Frimpong M. Raif Chachere, DPM [facility]  Dr. Aneesh Faller M. Edina Winningham, DPM    [address]                                        [place], [place] [address]                Office [phone]  Fax [phone]       

## 2016-05-20 ENCOUNTER — Other Ambulatory Visit: Payer: Self-pay | Admitting: Family Medicine

## 2016-05-20 DIAGNOSIS — I5021 Acute systolic (congestive) heart failure: Secondary | ICD-10-CM

## 2016-06-01 ENCOUNTER — Telehealth: Payer: Self-pay | Admitting: Family Medicine

## 2016-06-01 MED ORDER — ESOMEPRAZOLE MAGNESIUM 20 MG PO CPDR: 20.0000 mg | DELAYED_RELEASE_CAPSULE | Freq: Every day | ORAL | 5 refills | Status: DC

## 2016-06-01 NOTE — Telephone Encounter (Signed)
Medication called/sent to requested pharmacy and pt's dtr aware 

## 2016-06-01 NOTE — Telephone Encounter (Signed)
Pt had been having some GERD sxs and dtr gave him Nexium 20mg  and it helped a lot and she would like it called into the pharm if possible or something equivalent. Pt is on Plavix.

## 2016-06-01 NOTE — Telephone Encounter (Signed)
Ok with nexium 20 poday 30 rf =1

## 2016-06-04 DIAGNOSIS — R0600 Dyspnea, unspecified: Secondary | ICD-10-CM | POA: Diagnosis not present

## 2016-06-04 DIAGNOSIS — J9601 Acute respiratory failure with hypoxia: Secondary | ICD-10-CM | POA: Diagnosis not present

## 2016-06-14 ENCOUNTER — Other Ambulatory Visit: Payer: Self-pay | Admitting: Cardiovascular Disease

## 2016-07-04 DIAGNOSIS — R0600 Dyspnea, unspecified: Secondary | ICD-10-CM | POA: Diagnosis not present

## 2016-07-04 DIAGNOSIS — J9601 Acute respiratory failure with hypoxia: Secondary | ICD-10-CM | POA: Diagnosis not present

## 2016-08-04 DIAGNOSIS — J9601 Acute respiratory failure with hypoxia: Secondary | ICD-10-CM | POA: Diagnosis not present

## 2016-08-04 DIAGNOSIS — R0600 Dyspnea, unspecified: Secondary | ICD-10-CM | POA: Diagnosis not present

## 2016-08-08 ENCOUNTER — Ambulatory Visit (INDEPENDENT_AMBULATORY_CARE_PROVIDER_SITE_OTHER): Payer: Medicare Other | Admitting: Podiatry

## 2016-08-08 ENCOUNTER — Encounter: Payer: Self-pay | Admitting: Podiatry

## 2016-08-08 DIAGNOSIS — B351 Tinea unguium: Secondary | ICD-10-CM

## 2016-08-08 DIAGNOSIS — E0842 Diabetes mellitus due to underlying condition with diabetic polyneuropathy: Secondary | ICD-10-CM

## 2016-08-08 DIAGNOSIS — M79676 Pain in unspecified toe(s): Secondary | ICD-10-CM | POA: Diagnosis not present

## 2016-08-12 NOTE — Progress Notes (Signed)
   SUBJECTIVE Patient with a history of diabetes mellitus presents to office today complaining of elongated, thickened nails. Pain while ambulating in shoes. Patient is unable to trim their own nails.  He is here for further evaluation and treatment.  OBJECTIVE General Patient is awake, alert, and oriented x 3 and in no acute distress. Derm Skin is dry and supple bilateral. Negative open lesions or macerations. Remaining integument unremarkable. Nails are tender, long, thickened and dystrophic with subungual debris, consistent with onychomycosis, 1-5 bilateral. No signs of infection noted. Vasc  DP and PT pedal pulses palpable bilaterally. Temperature gradient within normal limits.  Neuro Epicritic and protective threshold sensation diminished bilaterally.  Musculoskeletal Exam No symptomatic pedal deformities noted bilateral. Muscular strength within normal limits.  ASSESSMENT 1. Diabetes Mellitus w/ peripheral neuropathy 2. Onychomycosis of nail due to dermatophyte bilateral 3. Pain in foot bilateral  PLAN OF CARE 1. Patient evaluated today. 2. Instructed to maintain good pedal hygiene and foot care. Stressed importance of controlling blood sugar.  3. Mechanical debridement of nails 1-5 bilaterally performed using a nail nipper. Filed with dremel without incident.  4. Return to clinic in 3 mos.     Felecia Shelling, DPM [facility]  Dr. Felecia Shelling, DPM    [address]                                        [place], [place] [address]                Office [phone]  Fax [phone]

## 2016-08-19 ENCOUNTER — Other Ambulatory Visit: Payer: Self-pay | Admitting: Family Medicine

## 2016-09-03 DIAGNOSIS — J9601 Acute respiratory failure with hypoxia: Secondary | ICD-10-CM | POA: Diagnosis not present

## 2016-09-03 DIAGNOSIS — R0600 Dyspnea, unspecified: Secondary | ICD-10-CM | POA: Diagnosis not present

## 2016-09-20 ENCOUNTER — Other Ambulatory Visit: Payer: Self-pay

## 2016-09-20 NOTE — Patient Outreach (Signed)
[facility]) Care Management  09/20/2016  [name] 07-07-1921 [id number]   Medication Adherence call to [name] the reason for the call is because he is showing past due on his atorvastatin 80 mg call patient he answer but hung up the phone before we can ask any questions.    Lillia Abed CPhT Pharmacy Technician [facility] Management Direct Dial [phone]  Fax [phone] [email or web address]

## 2016-09-25 ENCOUNTER — Other Ambulatory Visit: Payer: Self-pay | Admitting: Family Medicine

## 2016-09-25 ENCOUNTER — Other Ambulatory Visit: Payer: Self-pay | Admitting: Cardiovascular Disease

## 2016-09-25 NOTE — Telephone Encounter (Signed)
REFILL 

## 2016-09-27 ENCOUNTER — Ambulatory Visit (INDEPENDENT_AMBULATORY_CARE_PROVIDER_SITE_OTHER): Payer: Medicare Other | Admitting: Family Medicine

## 2016-09-27 ENCOUNTER — Encounter: Payer: Self-pay | Admitting: Family Medicine

## 2016-09-27 VITALS — BP 130/58 | HR 72 | Temp 98.5°F | Resp 12 | Ht 65.0 in | Wt 128.0 lb

## 2016-09-27 DIAGNOSIS — I10 Essential (primary) hypertension: Secondary | ICD-10-CM | POA: Diagnosis not present

## 2016-09-27 DIAGNOSIS — K219 Gastro-esophageal reflux disease without esophagitis: Secondary | ICD-10-CM | POA: Diagnosis not present

## 2016-09-27 DIAGNOSIS — E119 Type 2 diabetes mellitus without complications: Secondary | ICD-10-CM

## 2016-09-27 DIAGNOSIS — I5021 Acute systolic (congestive) heart failure: Secondary | ICD-10-CM | POA: Diagnosis not present

## 2016-09-27 MED ORDER — PANTOPRAZOLE SODIUM 40 MG PO TBEC: 40.0000 mg | DELAYED_RELEASE_TABLET | Freq: Every day | ORAL | 3 refills | Status: DC

## 2016-09-27 NOTE — Progress Notes (Signed)
Subjective:    Patient ID: [name], male    DOB: [birth year], 83 y.o.   MRN: [id number]  Medication Refill   11/04/14 Patient is a very pleasant 83 year old white male who presents with dyspnea on exertion. It has been recently worsening over the last few days. Walking to the exam room on room air he is 81%. At rest on 2 L via nasal cannula, his oxygen saturation rises to 97%. Patient denies chest pain. He does have a history of coronary artery disease status post CABG. There is no edema. His weight is up 3 pounds since his last office visit but on physical exam today there is no evidence of fluid overload. He does have faint bibasilar crackles left greater than right. He denies any angina. He denies any orthopnea. He only reports dyspnea on exertion.  At that time, my plan was: Based on his physical exam, I am suspicious the patient has left lower lobe pneumonia. I will check a fingerstick hemoglobin to rule out severe anemia. I will check an EKG. I will also order a chest x-ray. If pneumonia is confirmed, we'll try outpatient treatment with levaquin and oxygen at home as clinically the patient appears well.    Hemoglobin is 11.7 and I do not expect this would explain his dyspnea on exertion. EKG shows Q waves in aVL which are old. It also shows Q waves in the precordial leads which are old. There is some mild ST segment depression and nonspecific changes in the inferior leads which are old compared to an EKG obtained in August 2016. Therefore there is no change in his EKG. I will send the patient for a chest x-ray.  I will arrange for the patient's oxygen at home  2 L via nasal cannula. I will await the results of the chest x-ray.  Patient's sats dropped to 81% with exertion on room air. On 2 L via nasal cannula at rest, the patient's saturations were 93%. With exertion on 2 L via nasal cannula, oxygen saturations were 97%.  11/08/14 CXR revealed: Probable interstitial pulmonary edema with  small bilateral pleural effusions. My plan was: X-ray shows pulmonary edema and small pleural effusions. This may explain the abnormalities heard on his pulm exam. Increase Lasix to 40 mg twice a day and potassium to 20 mEq twice a day. Wear oxygen at home. Recheck on Monday. Patient will likely need an echocardiogram. Go to the hospital if worsening.  Patient states that his breathing is much better since increasing his fluid pills. He has lost 5 pounds since I increased his fluid pills. His dyspnea on exertion has improved dramatically. He is compliant with wearing his oxygen. He denies any chest pain.  At that time, my plan was: Discontinue amlodipine. Discontinue potassium. Decrease fluid pill back to 20 mg by mouth daily. Continue with oxygen. Recheck in one week  11/15/14 Patient is here for follow up.  He has gained 2 lbs since his OV 1 week ago.  O2 sats are 97% on 2 L, 83% on RA at rest.  He feels great on oxygen.  BP has risen substantially off amlodipine.  At that time, my plan was: Increase lasix to 40 mg poqday.  Stay off potassium and recheck BMP in 1 month.  Resume amlodipine 5 mg poqday.  Continue O2 2 L via [place].  Recheck in 1 month. Wt Readings from Last 3 Encounters:  09/27/16 128 lb (58.1 kg)  04/05/16 129 lb (58.5 kg)  03/29/16  131 lb (59.4 kg)   12/16/14 Patient is here today for follow-up. His weight is stable since last office visit at 130 pounds. He has no pitting edema in his legs. There are no rails that I can appreciate in his lungs. He states that his shortness of breath is much improved. He denies any dyspnea at rest. He denies any dyspnea on exertion. Overall he is doing well and his blood pressure is much better today at 122/58. Clinically he seems to be doing very well.  At that time, my plan was: Clinically, the patient's medication regimen seems to be perfect. I will check his BMP to monitor his renal function and potassium. If stable I will continue Lasix 40 mg every  day and recheck the patient back in 3 months  03/24/15 He continues to thrive at home.  His weight is up 6 pounds but he is not retaining any fluid in his legs. Furthermore his lungs are clear. His oxygen saturations are 99% on oxygen.  He denies any shortness of breath or chest pain or palpitations. His daughter is cooking for him now on a daily basis and that is why he has gained weight because he is eating better. His strength is improved. He denies any pain. He denies any stomach problems. He denies any constipation or diarrhea. He is overdue to check fasting lab work. He is eating today and therefore I hate to make him come back. But I would like to recheck his hemoglobin A1c to monitor his diabetes and his blood pressure today is well controlled at 120/80. He denies any symptoms of hypoglycemia. At present his diabetes is controlled through diet. At that time, my plan was: I am pleasantly surprised by how well the patient is doing. I believe the 40 mg a day of Lasix is the appropriate dose of diuretic that is maintaining his fluid status. There is no evidence of fluid overload on exam or pulmonary edema. His oxygen is stable. His blood pressure is excellent. Therefore I will make no changes in his medication at this time.  I will check a hemoglobin A1c to ensure that his diabetes is adequately controlled. Given his advanced age, I would not start medication for diabetes mellitus his hemoglobin A1c was well above 8. I will also check a CBC to monitor for anemia and check a CMP to monitor his kidney and liver function along with his potassium. I would like to get fasting lipid panel at his convenience. His immunizations are up-to-date.  When he does come by for fasting lab work, his goal LDL cholesterol be less than 70 given his history of coronary artery disease.  09/22/15 Patient is here to a for recheck. Pulse oximetry on room air was 85%. On 2 L via nasal cannula is up to 95%. His lungs are clear to  auscultation bilaterally. There are no wheezes crackles R[place]. Please see my last office visit. The skin tear on his left knee is healing nicely. It now just needs a simple Band-Aid. He continues to have decreased range of motion in his right shoulder. Abduction is limited to 90. However he denies any pain. He is due for lab work.  AT that time, my plan was: Patient appears euvolemic today. There is no pitting edema. His lungs are clear. I will make no changes in his dose of medication. His blood pressures well controlled. He does require 2 L of oxygen via nasal cannula due to chronic hypoxemia. I will check a hemoglobin  A1c along with a CBC and a CMP. If lab work is excellent, I would just recheck the patient in 6 months or as needed  03/29/16 Patient is here today for follow-up. Overall he is been doing very well. Denies any chest pain or dyspnea on exertion. He uses his oxygen at night when he sleeps. He denies any orthopnea or paroxysmal nocturnal dyspnea. He denies any pitting edema in his extremities. Both he and his daughter report one episode of gross hematuria that occurred yesterday. Daughter states that she could still see the yellow tinged urine but it was extremely bright red mixed with blood.  He denies any dysuria. He denies any low back pain. He denies any frequency or urgency. This is been the only time this is occurred.  Patient appears euvolemic. His lungs are clear. There is no pitting edema. His weight has remained relatively stable over the last 6 months. In fact he is down 3 or 4 pounds.  AT that time, my plan was: From the standpoint of his congestive heart failure, the patient is euvolemic. He is not requiring more oxygen. He denies any orthopnea or PND. There is no pitting edema and there is no evidence of pulmonary edema on his exam. Therefore there are no indications for medication changes at this time. His blood pressures well controlled at 130/64. Regarding his diabetes I am being  very conservative in management of this. I will be willing to except a hemoglobin A1c less than 8 because of his age. I will check that today. I will also check a CBC given the fact the patient's ongoing antiplatelet therapy.  Patient is on dual antiplatelet therapy due to his history of coronary artery disease as well as his history of a CVA while taking aspirin alone. The hematuria could be due to a urinary tract infection, kidney stone, insignificant irritation, or worse [place] scenario bladder cancer. I will perform a urinalysis today to rule out a urinary tract infection. If urine sample shows no evidence of urinary tract infection, I would recommend just clinical monitoring. If the hematuria persists, I would discontinue dual antiplatelet therapy. I would have the patient discontinue Plavix and remain just on aspirin. Given his age, I would not proceed with a cystoscopy or CT of the kidneys unless symptoms are persistent and worsening. Fortunately, urinalysis today is completely clear. There is no blood, leukocyte esterase, or nitrites. Therefore, we have determined to monitor the situation clinically. If the bleeding returns or persists I will discontinue Plavix. Otherwise we will not workup further at this time  09/27/16 Patient is here today with his daughter for follow-up. She states that he is having a lot of heartburn. He denies any chest pain, shortness of breath, dyspnea on exertion. He denies any orthopnea. His weight has remained remarkably stable. He is down a proximally 3 pounds since his last office visit however he is eating good. Daughter believes is getting adequate protein and nutrition as she prepares all of his meals. He also is burping quite frequently. He denies any melena or hematochezia. He denies any constipation. He denies any abdominal pain. He is not checking his blood sugar but he denies any polyuria, polydipsia, or blurry vision.   Past Medical History:  Diagnosis Date  . CAD  (coronary artery disease)   . Carotid stenosis 09/24/11   right bulb/proximal ICA demonstrates a mild amont of fibrous plaque elevating velocitieswithin the proximal and mid segments of the internal carotid artery. Left CEA demonstrats a  trace amount of fibrous plaque with  no evidence of a significant diameter reduction, tortuosity or any other vascular abnormality.  . Cataract   . Diabetes mellitus   . Hyperlipidemia 06/02/08   Nuclear stress test- high risk scan EF 29% Post stress LV cavity enlargement suggests multi vessel disease.  Marland Kitchen Hypertension 08/28/09   ECHO- EF 50-55%  . Stroke [facility])    Past Surgical History:  Procedure Laterality Date  . APPENDECTOMY    . CARDIAC CATHETERIZATION  06/10/2008  . CAROTID ENDARTERECTOMY     with darcon patch angioplasty 03/17/2001  . CARPAL TUNNEL RELEASE     Left  . CORONARY ARTERY BYPASS GRAFT     + AV replacement  . TEE WITHOUT CARDIOVERSION N/A 08/15/2012   Procedure: TRANSESOPHAGEAL ECHOCARDIOGRAM (TEE);  Surgeon: Pricilla Riffle, MD;  Location[facility] ENDOSCOPY;  Service: Cardiovascular;  Laterality: N/A;   Current Outpatient Prescriptions on File Prior to Visit  Medication Sig Dispense Refill  . amLODipine (NORVASC) 10 MG tablet Take 0.5 tablets (5 mg total) by mouth daily. 90 tablet 3  . aspirin EC 81 MG tablet Take 81 mg by mouth daily.    Marland Kitchen atorvastatin (LIPITOR) 80 MG tablet TAKE 1 TABLET BY MOUTH EVERY DAY 30 tablet 5  . benazepril (LOTENSIN) 40 MG tablet TAKE 1 TABLET(40 MG) BY MOUTH DAILY 90 tablet 3  . carvedilol (COREG) 12.5 MG tablet TAKE 1 TABLET BY MOUTH TWICE DAILY WITH A MEAL 180 tablet 2  . clopidogrel (PLAVIX) 75 MG tablet TAKE 1 TABLET BY MOUTH EVERY DAY 30 tablet 11  . furosemide (LASIX) 20 MG tablet TAKE 2 TABLETS(40 MG) BY MOUTH DAILY 60 tablet 5  . pyridOXINE (VITAMIN B-6) 100 MG tablet Take 100 mg by mouth daily.    Marland Kitchen VITAMIN D, CHOLECALCIFEROL, PO Take 1,000 Units by mouth daily.       No current facility-administered  medications on file prior to visit.    No Known Allergies Social History   Social History  . Marital status: Widowed    Spouse name: N/A  . Number of children: 2  . Years of education: 10   Occupational History  . retired    Social History Main Topics  . Smoking status: Former Smoker    Quit date: 02/28/1978  . Smokeless tobacco: Never Used  . Alcohol use No  . Drug use: No  . Sexual activity: Not on file   Other Topics Concern  . Not on file   Social History Narrative  . No narrative on file      Review of Systems  All other systems reviewed and are negative.      Objective:   Physical Exam  Constitutional: He appears well-developed and well-nourished.  Neck: No JVD present.  Cardiovascular: Normal rate, regular rhythm and normal heart sounds.   Pulmonary/Chest: Effort normal.  Abdominal: Soft. Bowel sounds are normal. He exhibits no distension. There is no tenderness. There is no rebound.  Musculoskeletal: He exhibits no edema.  Vitals reviewed.         Assessment & Plan:  Gastroesophageal reflux disease without esophagitis - Plan: pantoprazole (PROTONIX) 40 MG tablet  Controlled type 2 diabetes mellitus without complication, without long-term current use of insulin (HCC) - Plan: COMPLETE METABOLIC PANEL WITH GFR, Hemoglobin A1c, CBC with Differential/Platelet  Benign essential HTN  CHF (congestive heart failure), NYHA class III, acute, systolic (HCC)  Patient is doing remarkably well for 83 years old. I recommended a can of ensure  every day to try to increase his protein intake to maintain his strength. His blood pressure is well controlled. He is asymptomatic the standpoint of congestive heart failure. I will treat his acid reflux with protonix 40 mg by mouth daily and reassess in one month. I will check a hemoglobin A1c to ensure adequate control his blood sugar.

## 2016-09-28 LAB — CBC WITH DIFFERENTIAL/PLATELET
BASOS PCT: 0 %
Basophils Absolute: 0 cells/uL (ref 0–200)
Eosinophils Absolute: 114 cells/uL (ref 15–500)
Eosinophils Relative: 2 %
HCT: 35.5 % — ABNORMAL LOW (ref 38.5–50.0)
Hemoglobin: 11.2 g/dL — ABNORMAL LOW (ref 13.0–17.0)
LYMPHS ABS: 912 {cells}/uL (ref 850–3900)
LYMPHS PCT: 16 %
MCH: 30.9 pg (ref 27.0–33.0)
MCHC: 31.5 g/dL — ABNORMAL LOW (ref 32.0–36.0)
MCV: 97.8 fL (ref 80.0–100.0)
MONO ABS: 456 {cells}/uL (ref 200–950)
MPV: 9.1 fL (ref 7.5–12.5)
Monocytes Relative: 8 %
NEUTROS PCT: 74 %
Neutro Abs: 4218 cells/uL (ref 1500–7800)
PLATELETS: 174 10*3/uL (ref 140–400)
RBC: 3.63 MIL/uL — AB (ref 4.20–5.80)
RDW: 13.9 % (ref 11.0–15.0)
WBC: 5.7 10*3/uL (ref 3.8–10.8)

## 2016-09-28 LAB — HEMOGLOBIN A1C
HEMOGLOBIN A1C: 5.9 % — AB (ref <5.7)
Mean Plasma Glucose: 123 mg/dL

## 2016-09-28 LAB — COMPLETE METABOLIC PANEL WITH GFR
ALBUMIN: 3.3 g/dL — AB (ref 3.6–5.1)
ALK PHOS: 51 U/L (ref 40–115)
ALT: 13 U/L (ref 9–46)
AST: 19 U/L (ref 10–35)
BUN: 25 mg/dL (ref 7–25)
CALCIUM: 9 mg/dL (ref 8.6–10.3)
CHLORIDE: 100 mmol/L (ref 98–110)
CO2: 33 mmol/L — ABNORMAL HIGH (ref 20–32)
Creat: 1.14 mg/dL — ABNORMAL HIGH (ref 0.70–1.11)
GFR, EST AFRICAN AMERICAN: 64 mL/min (ref >=60)
GFR, Est Non African American: 55 mL/min — ABNORMAL LOW (ref >=60)
Glucose, Bld: 155 mg/dL — ABNORMAL HIGH (ref 70–99)
Potassium: 4.8 mmol/L (ref 3.5–5.3)
Sodium: 144 mmol/L (ref 135–146)
TOTAL PROTEIN: 6.3 g/dL (ref 6.1–8.1)
Total Bilirubin: 0.4 mg/dL (ref 0.2–1.2)

## 2016-10-04 DIAGNOSIS — J9601 Acute respiratory failure with hypoxia: Secondary | ICD-10-CM | POA: Diagnosis not present

## 2016-10-04 DIAGNOSIS — R0600 Dyspnea, unspecified: Secondary | ICD-10-CM | POA: Diagnosis not present

## 2016-10-26 IMAGING — CR DG SHOULDER 2+V*R*
3 series · 3 of 3 positions shown · non-contrast
Comparison: None.

CLINICAL DATA: Generalized right shoulder pain after fall on right
side yesterday

EXAM:
RIGHT SHOULDER - 2+ VIEW

[w shoulder ap external righ]
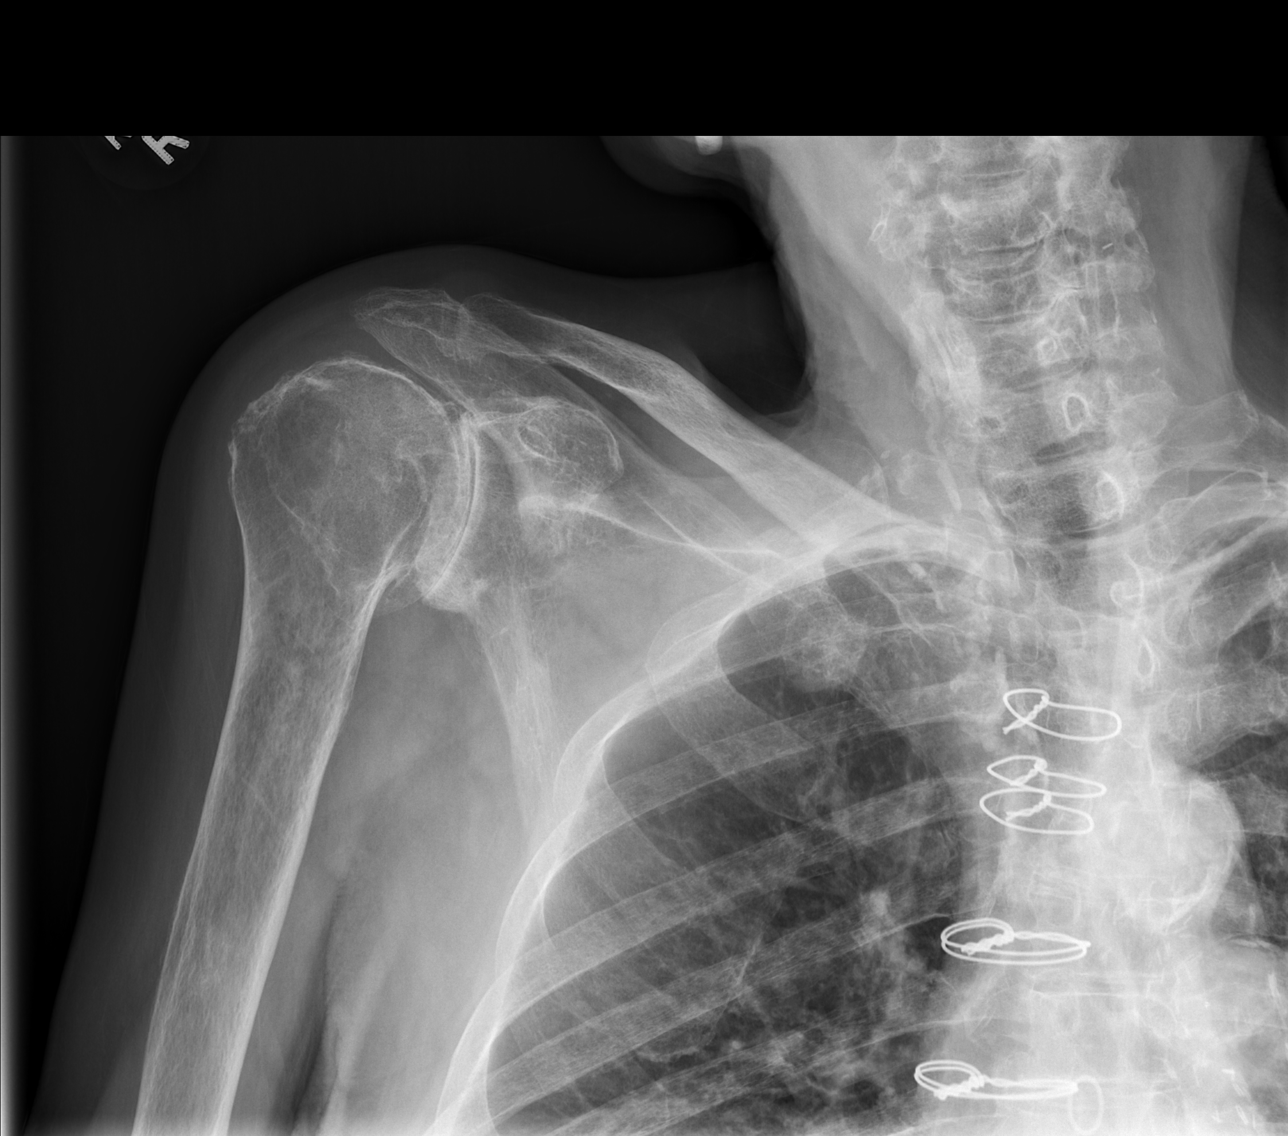

[w shoulder y view right]
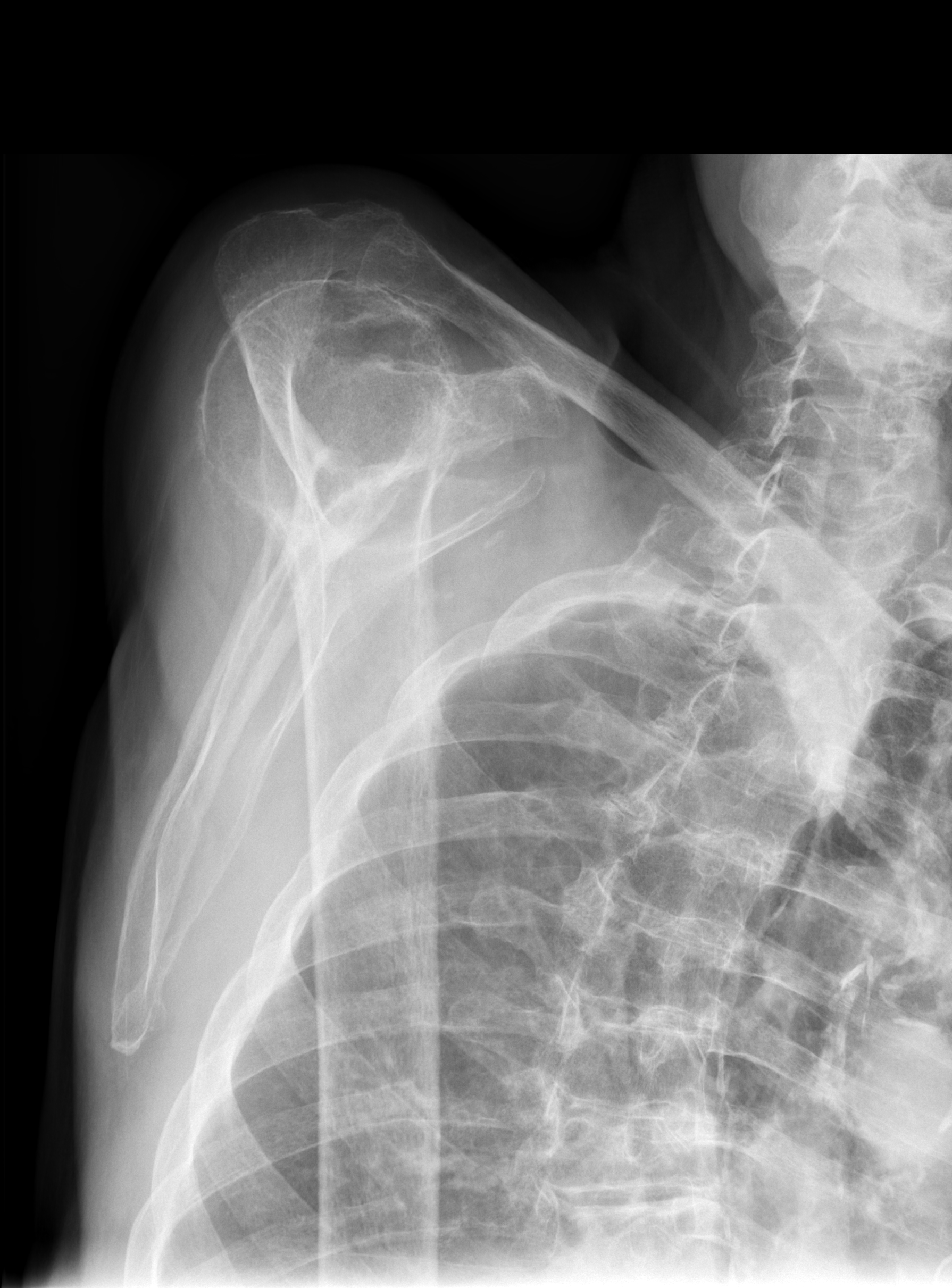

[x shoulder axillary right]
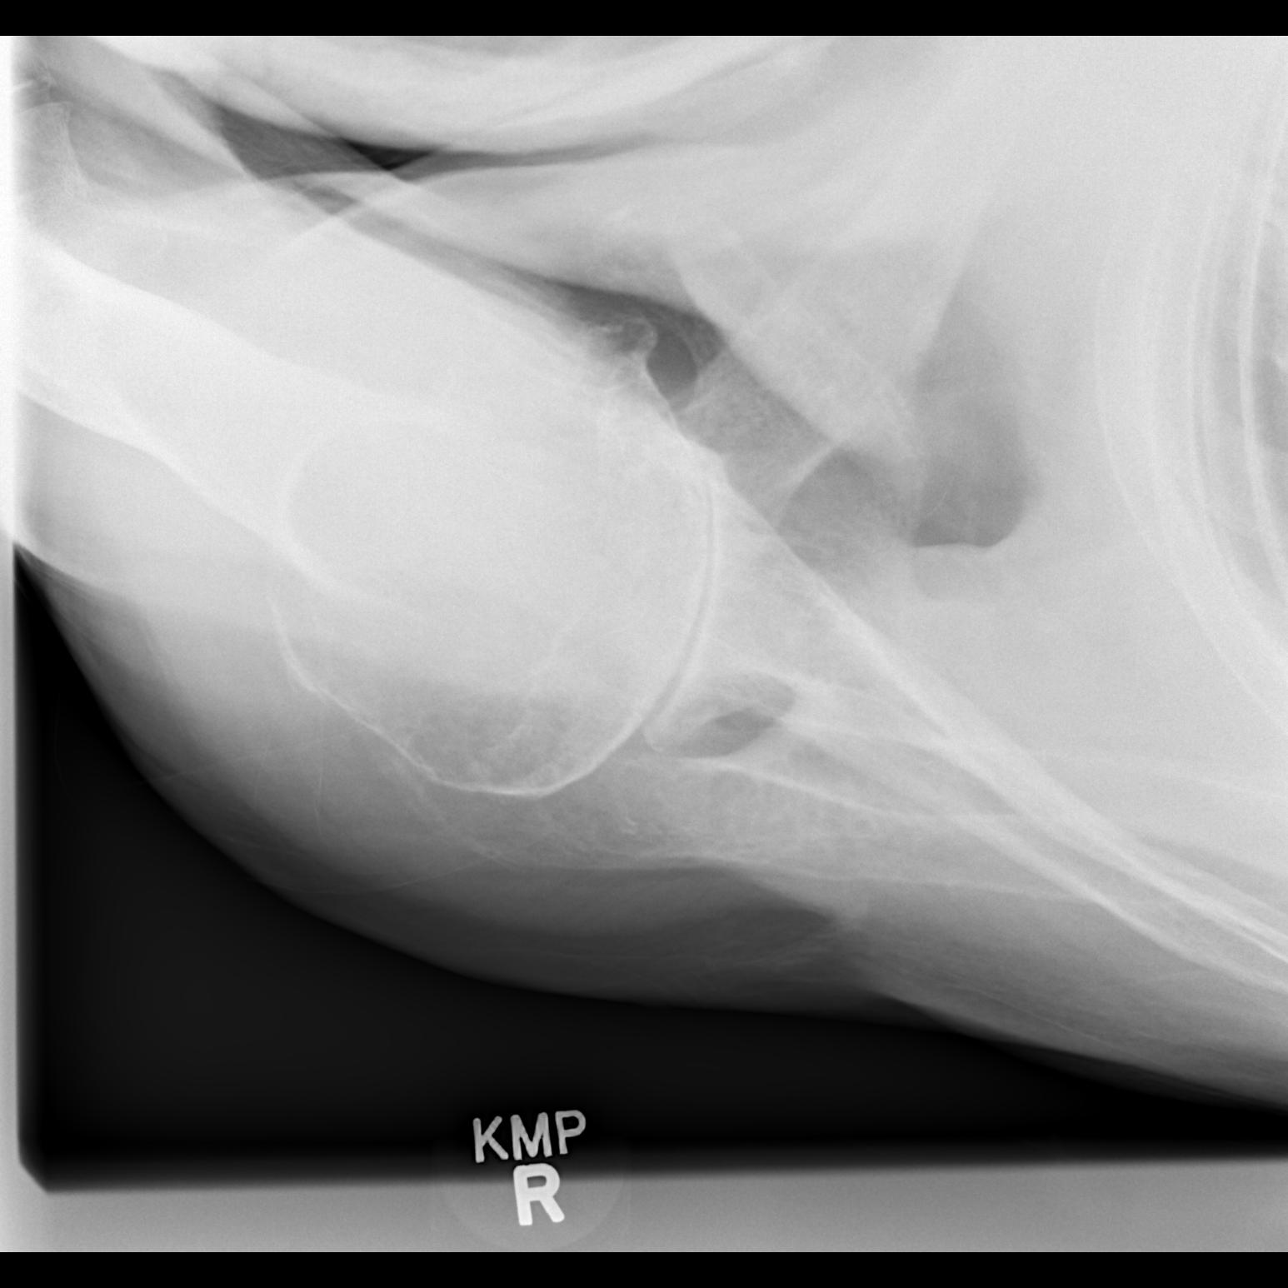

[3 of 3 positions shown; findings below may reference images not displayed]

FINDINGS: Advanced degenerative changes in the right glenohumeral joint.
Moderate degenerative changes in the right AC joint. No acute bony
abnormality. Specifically, no fracture, subluxation, or dislocation.
Soft tissues are intact.
IMPRESSION: No acute bony abnormality.

## 2016-11-04 DIAGNOSIS — R0600 Dyspnea, unspecified: Secondary | ICD-10-CM | POA: Diagnosis not present

## 2016-11-04 DIAGNOSIS — J9601 Acute respiratory failure with hypoxia: Secondary | ICD-10-CM | POA: Diagnosis not present

## 2016-11-21 ENCOUNTER — Other Ambulatory Visit: Payer: Self-pay | Admitting: Family Medicine

## 2016-11-21 DIAGNOSIS — I5021 Acute systolic (congestive) heart failure: Secondary | ICD-10-CM

## 2016-11-22 NOTE — Telephone Encounter (Signed)
Medication refilled per protocol. 

## 2016-11-27 ENCOUNTER — Ambulatory Visit (INDEPENDENT_AMBULATORY_CARE_PROVIDER_SITE_OTHER): Payer: Medicare Other | Admitting: *Deleted

## 2016-11-27 DIAGNOSIS — Z23 Encounter for immunization: Secondary | ICD-10-CM | POA: Diagnosis not present

## 2016-11-29 ENCOUNTER — Ambulatory Visit: Payer: Self-pay | Admitting: Family Medicine

## 2016-12-04 DIAGNOSIS — J9601 Acute respiratory failure with hypoxia: Secondary | ICD-10-CM | POA: Diagnosis not present

## 2016-12-04 DIAGNOSIS — R0600 Dyspnea, unspecified: Secondary | ICD-10-CM | POA: Diagnosis not present

## 2016-12-05 ENCOUNTER — Ambulatory Visit (INDEPENDENT_AMBULATORY_CARE_PROVIDER_SITE_OTHER): Payer: Medicare Other | Admitting: Podiatry

## 2016-12-05 DIAGNOSIS — B351 Tinea unguium: Secondary | ICD-10-CM | POA: Diagnosis not present

## 2016-12-05 DIAGNOSIS — M79676 Pain in unspecified toe(s): Secondary | ICD-10-CM

## 2016-12-05 DIAGNOSIS — E0842 Diabetes mellitus due to underlying condition with diabetic polyneuropathy: Secondary | ICD-10-CM | POA: Diagnosis not present

## 2016-12-06 ENCOUNTER — Encounter: Payer: Self-pay | Admitting: Family Medicine

## 2016-12-06 ENCOUNTER — Ambulatory Visit: Payer: Medicare Other | Admitting: Cardiovascular Disease

## 2016-12-06 ENCOUNTER — Ambulatory Visit (INDEPENDENT_AMBULATORY_CARE_PROVIDER_SITE_OTHER): Payer: Medicare Other | Admitting: Family Medicine

## 2016-12-06 VITALS — BP 100/60 | HR 76 | Temp 98.1°F | Resp 12 | Ht 65.0 in | Wt 129.0 lb

## 2016-12-06 DIAGNOSIS — H6123 Impacted cerumen, bilateral: Secondary | ICD-10-CM | POA: Diagnosis not present

## 2016-12-06 DIAGNOSIS — R140 Abdominal distension (gaseous): Secondary | ICD-10-CM | POA: Diagnosis not present

## 2016-12-06 NOTE — Progress Notes (Signed)
Subjective:    Patient ID: [name], male    DOB: August 18, 1921, 83 y.o.   MRN: [id number]  Medication Refill   11/04/14 Patient is a very pleasant 83 year old white male who presents with dyspnea on exertion. It has been recently worsening over the last few days. Walking to the exam room on room air he is 81%. At rest on 2 L via nasal cannula, his oxygen saturation rises to 97%. Patient denies chest pain. He does have a history of coronary artery disease status post CABG. There is no edema. His weight is up 3 pounds since his last office visit but on physical exam today there is no evidence of fluid overload. He does have faint bibasilar crackles left greater than right. He denies any angina. He denies any orthopnea. He only reports dyspnea on exertion.  At that time, my plan was: Based on his physical exam, I am suspicious the patient has left lower lobe pneumonia. I will check a fingerstick hemoglobin to rule out severe anemia. I will check an EKG. I will also order a chest x-ray. If pneumonia is confirmed, we'll try outpatient treatment with levaquin and oxygen at home as clinically the patient appears well.    Hemoglobin is 11.7 and I do not expect this would explain his dyspnea on exertion. EKG shows Q waves in aVL which are old. It also shows Q waves in the precordial leads which are old. There is some mild ST segment depression and nonspecific changes in the inferior leads which are old compared to an EKG obtained in August 2016. Therefore there is no change in his EKG. I will send the patient for a chest x-ray.  I will arrange for the patient's oxygen at home  2 L via nasal cannula. I will await the results of the chest x-ray.  Patient's sats dropped to 81% with exertion on room air. On 2 L via nasal cannula at rest, the patient's saturations were 93%. With exertion on 2 L via nasal cannula, oxygen saturations were 97%.  11/08/14 CXR revealed: Probable interstitial pulmonary edema with  small bilateral pleural effusions. My plan was: X-ray shows pulmonary edema and small pleural effusions. This may explain the abnormalities heard on his pulm exam. Increase Lasix to 40 mg twice a day and potassium to 20 mEq twice a day. Wear oxygen at home. Recheck on Monday. Patient will likely need an echocardiogram. Go to the hospital if worsening.  Patient states that his breathing is much better since increasing his fluid pills. He has lost 5 pounds since I increased his fluid pills. His dyspnea on exertion has improved dramatically. He is compliant with wearing his oxygen. He denies any chest pain.  At that time, my plan was: Discontinue amlodipine. Discontinue potassium. Decrease fluid pill back to 20 mg by mouth daily. Continue with oxygen. Recheck in one week  11/15/14 Patient is here for follow up.  He has gained 2 lbs since his OV 1 week ago.  O2 sats are 97% on 2 L, 83% on RA at rest.  He feels great on oxygen.  BP has risen substantially off amlodipine.  At that time, my plan was: Increase lasix to 40 mg poqday.  Stay off potassium and recheck BMP in 1 month.  Resume amlodipine 5 mg poqday.  Continue O2 2 L via [place].  Recheck in 1 month. Wt Readings from Last 3 Encounters:  12/06/16 129 lb (58.5 kg)  09/27/16 128 lb (58.1 kg)  04/05/16  129 lb (58.5 kg)   12/16/14 Patient is here today for follow-up. His weight is stable since last office visit at 130 pounds. He has no pitting edema in his legs. There are no rails that I can appreciate in his lungs. He states that his shortness of breath is much improved. He denies any dyspnea at rest. He denies any dyspnea on exertion. Overall he is doing well and his blood pressure is much better today at 122/58. Clinically he seems to be doing very well.  At that time, my plan was: Clinically, the patient's medication regimen seems to be perfect. I will check his BMP to monitor his renal function and potassium. If stable I will continue Lasix 40 mg every  day and recheck the patient back in 3 months  03/24/15 He continues to thrive at home.  His weight is up 6 pounds but he is not retaining any fluid in his legs. Furthermore his lungs are clear. His oxygen saturations are 99% on oxygen.  He denies any shortness of breath or chest pain or palpitations. His daughter is cooking for him now on a daily basis and that is why he has gained weight because he is eating better. His strength is improved. He denies any pain. He denies any stomach problems. He denies any constipation or diarrhea. He is overdue to check fasting lab work. He is eating today and therefore I hate to make him come back. But I would like to recheck his hemoglobin A1c to monitor his diabetes and his blood pressure today is well controlled at 120/80. He denies any symptoms of hypoglycemia. At present his diabetes is controlled through diet. At that time, my plan was: I am pleasantly surprised by how well the patient is doing. I believe the 40 mg a day of Lasix is the appropriate dose of diuretic that is maintaining his fluid status. There is no evidence of fluid overload on exam or pulmonary edema. His oxygen is stable. His blood pressure is excellent. Therefore I will make no changes in his medication at this time.  I will check a hemoglobin A1c to ensure that his diabetes is adequately controlled. Given his advanced age, I would not start medication for diabetes mellitus his hemoglobin A1c was well above 8. I will also check a CBC to monitor for anemia and check a CMP to monitor his kidney and liver function along with his potassium. I would like to get fasting lipid panel at his convenience. His immunizations are up-to-date.  When he does come by for fasting lab work, his goal LDL cholesterol be less than 70 given his history of coronary artery disease.  09/22/15 Patient is here to a for recheck. Pulse oximetry on room air was 85%. On 2 L via nasal cannula is up to 95%. His lungs are clear to  auscultation bilaterally. There are no wheezes crackles Rales. Please see my last office visit. The skin tear on his left knee is healing nicely. It now just needs a simple Band-Aid. He continues to have decreased range of motion in his right shoulder. Abduction is limited to 90. However he denies any pain. He is due for lab work.  AT that time, my plan was: Patient appears euvolemic today. There is no pitting edema. His lungs are clear. I will make no changes in his dose of medication. His blood pressures well controlled. He does require 2 L of oxygen via nasal cannula due to chronic hypoxemia. I will check a hemoglobin  A1c along with a CBC and a CMP. If lab work is excellent, I would just recheck the patient in 6 months or as needed  03/29/16 Patient is here today for follow-up. Overall he is been doing very well. Denies any chest pain or dyspnea on exertion. He uses his oxygen at night when he sleeps. He denies any orthopnea or paroxysmal nocturnal dyspnea. He denies any pitting edema in his extremities. Both he and his daughter report one episode of gross hematuria that occurred yesterday. Daughter states that she could still see the yellow tinged urine but it was extremely bright red mixed with blood.  He denies any dysuria. He denies any low back pain. He denies any frequency or urgency. This is been the only time this is occurred.  Patient appears euvolemic. His lungs are clear. There is no pitting edema. His weight has remained relatively stable over the last 6 months. In fact he is down 3 or 4 pounds.  AT that time, my plan was: From the standpoint of his congestive heart failure, the patient is euvolemic. He is not requiring more oxygen. He denies any orthopnea or PND. There is no pitting edema and there is no evidence of pulmonary edema on his exam. Therefore there are no indications for medication changes at this time. His blood pressures well controlled at 130/64. Regarding his diabetes I am being  very conservative in management of this. I will be willing to except a hemoglobin A1c less than 8 because of his age. I will check that today. I will also check a CBC given the fact the patient's ongoing antiplatelet therapy.  Patient is on dual antiplatelet therapy due to his history of coronary artery disease as well as his history of a CVA while taking aspirin alone. The hematuria could be due to a urinary tract infection, kidney stone, insignificant irritation, or worse Buscemi scenario bladder cancer. I will perform a urinalysis today to rule out a urinary tract infection. If urine sample shows no evidence of urinary tract infection, I would recommend just clinical monitoring. If the hematuria persists, I would discontinue dual antiplatelet therapy. I would have the patient discontinue Plavix and remain just on aspirin. Given his age, I would not proceed with a cystoscopy or CT of the kidneys unless symptoms are persistent and worsening. Fortunately, urinalysis today is completely clear. There is no blood, leukocyte esterase, or nitrites. Therefore, we have determined to monitor the situation clinically. If the bleeding returns or persists I will discontinue Plavix. Otherwise we will not workup further at this time  09/27/16 Patient is here today with his daughter for follow-up. She states that he is having a lot of heartburn. He denies any chest pain, shortness of breath, dyspnea on exertion. He denies any orthopnea. His weight has remained remarkably stable. He is down a proximally 3 pounds since his last office visit however he is eating good. Daughter believes is getting adequate protein and nutrition as she prepares all of his meals. He also is burping quite frequently. He denies any melena or hematochezia. He denies any constipation. He denies any abdominal pain. He is not checking his blood sugar but he denies any polyuria, polydipsia, or blurry vision.  ATthat time, my plan was: Patient is doing  remarkably well for 83 years old. I recommended a can of ensure every day to try to increase his protein intake to maintain his strength. His blood pressure is well controlled. He is asymptomatic the standpoint of congestive heart  failure. I will treat his acid reflux with protonix 40 mg by mouth daily and reassess in one month. I will check a hemoglobin A1c to ensure adequate control his blood sugar.  12/06/16 Pain in his belly has resolved on protonix.  However he continues to burp and pass wind excessively. He denies any abdominal pain or bloating. Bowel sounds are normal. He does report some difficulty having bowel movements and some slight constipation.He also complains of hearing loss bilaterally and on examination has cerumen impactions bilaterally.   Past Medical History:  Diagnosis Date  . CAD (coronary artery disease)   . Carotid stenosis 09/24/11   right bulb/proximal ICA demonstrates a mild amont of fibrous plaque elevating velocitieswithin the proximal and mid segments of the internal carotid artery. Left CEA demonstrats a trace amount of fibrous plaque with  no evidence of a significant diameter reduction, tortuosity or any other vascular abnormality.  . Cataract   . Diabetes mellitus   . Hyperlipidemia 06/02/08   Nuclear stress test- high risk scan EF 29% Post stress LV cavity enlargement suggests multi vessel disease.  Marland Kitchen Hypertension 08/28/09   ECHO- EF 50-55%  . Stroke [facility])    Past Surgical History:  Procedure Laterality Date  . APPENDECTOMY    . CARDIAC CATHETERIZATION  06/10/2008  . CAROTID ENDARTERECTOMY     with darcon patch angioplasty 03/17/2001  . CARPAL TUNNEL RELEASE     Left  . CORONARY ARTERY BYPASS GRAFT     + AV replacement  . TEE WITHOUT CARDIOVERSION N/A 08/15/2012   Procedure: TRANSESOPHAGEAL ECHOCARDIOGRAM (TEE);  Surgeon: Pricilla Riffle, MD;  Location: [facility] END[facility]COPY;  Service: Cardiovascular;  Laterality: N/A;   Current Outpatient Prescriptions on File  Prior to Visit  Medication Sig Dispense Refill  . amLODipine (NORVASC) 10 MG tablet Take 0.5 tablets (5 mg total) by mouth daily. 90 tablet 3  . aspirin EC 81 MG tablet Take 81 mg by mouth daily.    Marland Kitchen atorvastatin (LIPITOR) 80 MG tablet TAKE 1 TABLET BY MOUTH EVERY DAY 30 tablet 5  . benazepril (LOTENSIN) 40 MG tablet TAKE 1 TABLET(40 MG) BY MOUTH DAILY 90 tablet 3  . carvedilol (COREG) 12.5 MG tablet TAKE 1 TABLET BY MOUTH TWICE DAILY WITH A MEAL 180 tablet 2  . clopidogrel (PLAVIX) 75 MG tablet TAKE 1 TABLET BY MOUTH EVERY DAY 30 tablet 11  . furosemide (LASIX) 20 MG tablet TAKE 2 TABLETS(40 MG) BY MOUTH DAILY 60 tablet 1  . pantoprazole (PROTONIX) 40 MG tablet Take 1 tablet (40 mg total) by mouth daily. 30 tablet 3  . pyridOXINE (VITAMIN B-6) 100 MG tablet Take 100 mg by mouth daily.    Marland Kitchen VITAMIN D, CHOLECALCIFEROL, PO Take 1,000 Units by mouth daily.       No current facility-administered medications on file prior to visit.    No Known Allergies Social History   Social History  . Marital status: Widowed    Spouse name: N/A  . Number of children: 2  . Years of education: 10   Occupational History  . retired    Social History Main Topics  . Smoking status: Former Smoker    Quit date: 02/28/1978  . Smokeless tobacco: Never Used  . Alcohol use No  . Drug use: No  . Sexual activity: Not on file   Other Topics Concern  . Not on file   Social History Narrative  . No narrative on file      Review of Systems  All other systems reviewed and are negative.      Objective:   Physical Exam  Constitutional: He appears well-developed and well-nourished.  Neck: No JVD present.  Cardiovascular: Normal rate, regular rhythm and normal heart sounds.   Pulmonary/Chest: Effort normal.  Abdominal: Soft. Bowel sounds are normal. He exhibits no distension. There is no tenderness. There is no rebound.  Musculoskeletal: He exhibits no edema.  Vitals reviewed.  ilateral cerumen  impactions       Assessment & Plan:  Gassiness  Hearing loss due to cerumen impaction, bilateral  Cerumen impactions were removed with irrigation and lavage without difficulty. Hearing loss improved.I believe the gassiness is multi-factorial. I believe he has suffered from reflux, some constipation, some from eating too quickly in swallowing more air. I will try the patient on low dose linzess daily and see if symptoms improve.

## 2016-12-09 NOTE — Progress Notes (Signed)
   SUBJECTIVE Patient with a history of diabetes mellitus presents to office today complaining of elongated, thickened nails. Pain while ambulating in shoes. Patient is unable to trim their own nails.   Past Medical History:  Diagnosis Date  . CAD (coronary artery disease)   . Carotid stenosis 09/24/11   right bulb/proximal ICA demonstrates a mild amont of fibrous plaque elevating velocitieswithin the proximal and mid segments of the internal carotid artery. Left CEA demonstrats a trace amount of fibrous plaque with  no evidence of a significant diameter reduction, tortuosity or any other vascular abnormality.  . Cataract   . Diabetes mellitus   . Hyperlipidemia 06/02/08   Nuclear stress test- high risk scan EF 29% Post stress LV cavity enlargement suggests multi vessel disease.  Marland Kitchen Hypertension 08/28/09   ECHO- EF 50-55%  . Stroke [facility])     OBJECTIVE General Patient is awake, alert, and oriented x 3 and in no acute distress. Derm Skin is dry and supple bilateral. Negative open lesions or macerations. Remaining integument unremarkable. Nails are tender, long, thickened and dystrophic with subungual debris, consistent with onychomycosis, 1-5 bilateral. No signs of infection noted. Vasc  DP and PT pedal pulses palpable bilaterally. Temperature gradient within normal limits.  Neuro Epicritic and protective threshold sensation diminished bilaterally.  Musculoskeletal Exam No symptomatic pedal deformities noted bilateral. Muscular strength within normal limits.  ASSESSMENT 1. Diabetes Mellitus w/ peripheral neuropathy 2. Onychomycosis of nail due to dermatophyte bilateral 3. Pain in foot bilateral  PLAN OF CARE 1. Patient evaluated today. 2. Instructed to maintain good pedal hygiene and foot care. Stressed importance of controlling blood sugar.  3. Mechanical debridement of nails 1-5 bilaterally performed using a nail nipper. Filed with dremel without incident.  4. Return to clinic in 3  mos.     Felecia Shelling, DPM [facility]  Dr. Felecia Shelling, DPM    [address]                                        [place], [place] [address]                Office [phone]  Fax [phone]

## 2016-12-21 ENCOUNTER — Other Ambulatory Visit: Payer: Self-pay | Admitting: Family Medicine

## 2016-12-21 DIAGNOSIS — K219 Gastro-esophageal reflux disease without esophagitis: Secondary | ICD-10-CM

## 2016-12-21 DIAGNOSIS — I5021 Acute systolic (congestive) heart failure: Secondary | ICD-10-CM

## 2017-01-04 DIAGNOSIS — R0600 Dyspnea, unspecified: Secondary | ICD-10-CM | POA: Diagnosis not present

## 2017-01-04 DIAGNOSIS — J9601 Acute respiratory failure with hypoxia: Secondary | ICD-10-CM | POA: Diagnosis not present

## 2017-01-20 ENCOUNTER — Other Ambulatory Visit: Payer: Self-pay | Admitting: Family Medicine

## 2017-01-20 DIAGNOSIS — I5021 Acute systolic (congestive) heart failure: Secondary | ICD-10-CM

## 2017-02-01 ENCOUNTER — Other Ambulatory Visit: Payer: Self-pay | Admitting: Family Medicine

## 2017-02-01 DIAGNOSIS — K219 Gastro-esophageal reflux disease without esophagitis: Secondary | ICD-10-CM

## 2017-02-03 DIAGNOSIS — R0600 Dyspnea, unspecified: Secondary | ICD-10-CM | POA: Diagnosis not present

## 2017-02-03 DIAGNOSIS — J9601 Acute respiratory failure with hypoxia: Secondary | ICD-10-CM | POA: Diagnosis not present

## 2017-02-05 ENCOUNTER — Ambulatory Visit: Payer: Medicare Other | Admitting: Cardiovascular Disease

## 2017-03-06 DIAGNOSIS — J9601 Acute respiratory failure with hypoxia: Secondary | ICD-10-CM | POA: Diagnosis not present

## 2017-03-06 DIAGNOSIS — R0600 Dyspnea, unspecified: Secondary | ICD-10-CM | POA: Diagnosis not present

## 2017-03-11 ENCOUNTER — Ambulatory Visit: Payer: Medicare Other | Admitting: Podiatry

## 2017-03-22 DIAGNOSIS — 419620001 Death: Secondary | SNOMED CT | POA: Diagnosis not present

## 2017-03-22 DEATH — deceased

## 2017-04-01 ENCOUNTER — Ambulatory Visit: Payer: Medicare Other | Admitting: Family Medicine
